# Patient Record
Sex: Female | Born: 1980
Health system: Southern US, Community
[De-identification: ages and names within clinical notes are randomized; demographics above are authoritative.]

## PROBLEM LIST (undated history)

## (undated) DIAGNOSIS — M65311 Trigger thumb, right thumb: Secondary | ICD-10-CM

## (undated) DIAGNOSIS — H04123 Dry eye syndrome of bilateral lacrimal glands: Secondary | ICD-10-CM

## (undated) DIAGNOSIS — Z972 Presence of dental prosthetic device (complete) (partial): Secondary | ICD-10-CM

## (undated) DIAGNOSIS — R87619 Unspecified abnormal cytological findings in specimens from cervix uteri: Secondary | ICD-10-CM

## (undated) DIAGNOSIS — K589 Irritable bowel syndrome without diarrhea: Secondary | ICD-10-CM

## (undated) DIAGNOSIS — F419 Anxiety disorder, unspecified: Secondary | ICD-10-CM

## (undated) DIAGNOSIS — Z8719 Personal history of other diseases of the digestive system: Secondary | ICD-10-CM

## (undated) DIAGNOSIS — R519 Headache, unspecified: Secondary | ICD-10-CM

## (undated) DIAGNOSIS — R51 Headache: Secondary | ICD-10-CM

## (undated) DIAGNOSIS — E162 Hypoglycemia, unspecified: Secondary | ICD-10-CM

## (undated) DIAGNOSIS — J302 Other seasonal allergic rhinitis: Secondary | ICD-10-CM

## (undated) DIAGNOSIS — N809 Endometriosis, unspecified: Secondary | ICD-10-CM

## (undated) HISTORY — DX: Unspecified abnormal cytological findings in specimens from cervix uteri: R87.619

## (undated) HISTORY — PX: TRIGGER FINGER RELEASE: SHX641

## (undated) HISTORY — DX: Endometriosis, unspecified: N80.9

---
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
## 2000-05-26 ENCOUNTER — Other Ambulatory Visit: Admission: RE | Admit: 2000-05-26 | Discharge: 2000-05-26 | Payer: Self-pay | Admitting: *Deleted

## 2000-11-08 ENCOUNTER — Encounter: Admission: RE | Admit: 2000-11-08 | Discharge: 2000-11-08 | Payer: Self-pay | Admitting: Obstetrics and Gynecology

## 2000-11-08 ENCOUNTER — Encounter: Payer: Self-pay | Admitting: Obstetrics and Gynecology

## 2001-05-29 ENCOUNTER — Other Ambulatory Visit: Admission: RE | Admit: 2001-05-29 | Discharge: 2001-05-29 | Payer: Self-pay | Admitting: Obstetrics and Gynecology

## 2002-04-04 ENCOUNTER — Ambulatory Visit (HOSPITAL_COMMUNITY): Admission: RE | Admit: 2002-04-04 | Discharge: 2002-04-04 | Payer: Self-pay | Admitting: Gastroenterology

## 2002-04-04 ENCOUNTER — Encounter: Payer: Self-pay | Admitting: Gastroenterology

## 2002-09-12 ENCOUNTER — Other Ambulatory Visit: Admission: RE | Admit: 2002-09-12 | Discharge: 2002-09-12 | Payer: Self-pay | Admitting: Obstetrics and Gynecology

## 2003-09-16 ENCOUNTER — Other Ambulatory Visit: Admission: RE | Admit: 2003-09-16 | Discharge: 2003-09-16 | Payer: Self-pay | Admitting: *Deleted

## 2004-07-13 ENCOUNTER — Other Ambulatory Visit: Admission: RE | Admit: 2004-07-13 | Discharge: 2004-07-13 | Payer: Self-pay | Admitting: Obstetrics and Gynecology

## 2004-09-17 ENCOUNTER — Other Ambulatory Visit: Admission: RE | Admit: 2004-09-17 | Discharge: 2004-09-17 | Payer: Self-pay | Admitting: Obstetrics and Gynecology

## 2005-09-19 ENCOUNTER — Other Ambulatory Visit: Admission: RE | Admit: 2005-09-19 | Discharge: 2005-09-19 | Payer: Self-pay | Admitting: Obstetrics & Gynecology

## 2006-08-22 HISTORY — PX: COLPOSCOPY: SHX161

## 2012-02-02 LAB — HM PAP SMEAR: HM Pap smear: NEGATIVE

## 2013-01-22 ENCOUNTER — Encounter: Payer: Self-pay | Admitting: Nurse Practitioner

## 2013-01-23 ENCOUNTER — Encounter: Payer: Self-pay | Admitting: Nurse Practitioner

## 2013-01-30 ENCOUNTER — Telehealth: Payer: Self-pay | Admitting: Nurse Practitioner

## 2013-01-30 NOTE — Telephone Encounter (Signed)
Patient called about her appointment and birth control. Her appointment was rescheduled due to [facility] being unavailable. She will run out of her medications before her appointment date. Can we send a refill of Lorana and Valtrex to hold her over until her appointment on 7/29? [facility] uses [facility] [address] and [address]

## 2013-01-31 ENCOUNTER — Other Ambulatory Visit: Payer: Self-pay | Admitting: Nurse Practitioner

## 2013-01-31 ENCOUNTER — Other Ambulatory Visit: Payer: Self-pay | Admitting: *Deleted

## 2013-01-31 MED ORDER — VALACYCLOVIR HCL 1 G PO TABS: 1000.0000 mg | ORAL_TABLET | Freq: Every day | ORAL | Status: DC

## 2013-01-31 MED ORDER — DROSPIRENONE-ETHINYL ESTRADIOL 3-0.02 MG PO TABS: 1.0000 | ORAL_TABLET | Freq: Every day | ORAL | Status: DC

## 2013-01-31 NOTE — Telephone Encounter (Signed)
Rx for valtrex #30/ 1 refill 1 po qd and Rx for Yaz 1 pack/ 1 refill 1 po qd  [facility] to [facility] coverage until next aex on 03/19/2013. last aex on 02/02/2012. pt is aware.

## 2013-01-31 NOTE — Telephone Encounter (Signed)
Can this be done for her?

## 2013-01-31 NOTE — Telephone Encounter (Signed)
OK to refill both med's for a 3 month to be less expensive for her.

## 2013-01-31 NOTE — Telephone Encounter (Signed)
Rx for valtrex #30/ 1 refill 1 po qd and Rx for Yaz 1 pack/ 1 refill 1 po qd  [facility] until next aex on 03/19/2013. last aex on 02/02/2012. pt is aware.  

## 2013-02-07 ENCOUNTER — Ambulatory Visit: Payer: Self-pay | Admitting: Nurse Practitioner

## 2013-02-15 ENCOUNTER — Ambulatory Visit: Payer: Self-pay | Admitting: Nurse Practitioner

## 2013-03-12 ENCOUNTER — Ambulatory Visit: Payer: Self-pay | Admitting: Nurse Practitioner

## 2013-03-18 ENCOUNTER — Encounter: Payer: Self-pay | Admitting: *Deleted

## 2013-03-19 ENCOUNTER — Ambulatory Visit (INDEPENDENT_AMBULATORY_CARE_PROVIDER_SITE_OTHER): Payer: BC Managed Care – PPO | Admitting: Nurse Practitioner

## 2013-03-19 ENCOUNTER — Encounter: Payer: Self-pay | Admitting: Nurse Practitioner

## 2013-03-19 VITALS — BP 100/56 | HR 62 | Resp 12 | Ht 62.0 in | Wt 118.2 lb

## 2013-03-19 DIAGNOSIS — Z113 Encounter for screening for infections with a predominantly sexual mode of transmission: Secondary | ICD-10-CM

## 2013-03-19 DIAGNOSIS — Z01419 Encounter for gynecological examination (general) (routine) without abnormal findings: Secondary | ICD-10-CM

## 2013-03-19 DIAGNOSIS — Z0000 Encounter for general adult medical examination without abnormal findings: Secondary | ICD-10-CM

## 2013-03-19 LAB — HEMOGLOBIN, FINGERSTICK: Hemoglobin, fingerstick: 12.1 g/dL (ref 12.0–16.0)

## 2013-03-19 LAB — POCT URINALYSIS DIPSTICK
Leukocytes, UA: NEGATIVE
Spec Grav, UA: 1.015
Urobilinogen, UA: NEGATIVE
pH, UA: 6

## 2013-03-19 MED ORDER — VALACYCLOVIR HCL 1 G PO TABS: 1000.0000 mg | ORAL_TABLET | Freq: Every day | ORAL | Status: DC

## 2013-03-19 MED ORDER — ZOLPIDEM TARTRATE 10 MG PO TABS: 10.0000 mg | ORAL_TABLET | Freq: Every evening | ORAL | Status: DC | PRN

## 2013-03-19 MED ORDER — DROSPIRENONE-ETHINYL ESTRADIOL 3-0.02 MG PO TABS: 1.0000 | ORAL_TABLET | Freq: Every day | ORAL | Status: DC

## 2013-03-19 NOTE — Progress Notes (Signed)
32 y.o. G0P0 Single Caucasian Fe here for annual exam.  No new health concerns. Same partner for 1 year. He seems to be the one for long term relationship.  Patient's last menstrual period was 03/01/2013.          Sexually active: yes  The current method of family planning is OCP (estrogen/progesterone).    Exercising: yes  aerobics Smoker:  no  Health Maintenance: Pap:  02/02/2012  negative MMG:  never TDaP:  2014 Labs: Hgb- 12.4     reports that she has never smoked. She has never used smokeless tobacco. She reports that she drinks about 1.5 ounces of alcohol per week. She reports that she does not use illicit drugs.  Past Medical History  Diagnosis Date  . STD (sexually transmitted disease)     HSV II  . Abnormal Pap smear     Past Surgical History  Procedure Laterality Date  . Colposcopy  2008    Current Outpatient Prescriptions  Medication Sig Dispense Refill  . drospirenone-ethinyl estradiol (YAZ) 3-0.02 MG tablet Take 1 tablet by mouth daily.  1 Package  1  . valACYclovir (VALTREX) 1000 MG tablet Take 1 tablet (1,000 mg total) by mouth daily.  30 tablet  1  . zolpidem (AMBIEN) 10 MG tablet Take 10 mg by mouth at bedtime as needed for sleep.       No current facility-administered medications for this visit.    Family History  Problem Relation Age of Onset  . Breast cancer Maternal Grandmother     ROS:  Pertinent items are noted in HPI.  Otherwise, a comprehensive ROS was negative.  Exam:   BP 100/56  Pulse 62  Resp 12  Ht 5\' 2"  (1.575 m)  Wt 118 lb 3.2 oz (53.615 kg)  BMI 21.61 kg/m2  LMP 03/01/2013 Height: 5\' 2"  (157.5 cm)  Ht Readings from Last 3 Encounters:  03/19/13 5\' 2"  (1.575 m)    General appearance: alert, cooperative and appears stated age Head: Normocephalic, without obvious abnormality, atraumatic Neck: no adenopathy, supple, symmetrical, trachea midline and thyroid normal to inspection and palpation Lungs: clear to auscultation  bilaterally Breasts: normal appearance, no masses or tenderness Heart: regular rate and rhythm Abdomen: soft, non-tender; no masses,  no organomegaly Extremities: extremities normal, atraumatic, no cyanosis or edema Skin: Skin color, texture, turgor normal. No rashes or lesions Lymph nodes: Cervical, supraclavicular, and axillary nodes normal. No abnormal inguinal nodes palpated Neurologic: Grossly normal   Pelvic: External genitalia:  no lesions              Urethra:  normal appearing urethra with no masses, tenderness or lesions              Bartholin's and Skene's: normal                 Vagina: normal appearing vagina with normal color and discharge, no lesions              Cervix: anteverted              Pap taken: yes Bimanual Exam:  Uterus:  normal size, contour, position, consistency, mobility, non-tender              Adnexa: no mass, fullness, tenderness               Rectovaginal: Confirms               Anus:  normal sphincter tone, no lesions  A:  Well Woman with normal exam  History of HSV II maintenance dosing and prn dosing  Contraception  Insomnia with traveling   P:   Pap smear as per guidelines   Refilled Ambien # 30 / 1 (no other refills)  Refill of OCP Yaz generic for 1 year  Refill on Valtrex for 1 year  counseled on breast self exam, STD prevention, use and side effects of OCP's, adequate intake of calcium and vitamin D, diet and exercise return annually or prn  An After Visit Summary was printed and given to the patient.

## 2013-03-19 NOTE — Patient Instructions (Signed)
General topics  Next pap or exam is  due in 1 year Take a Women's multivitamin Take 1200 mg. of calcium daily - prefer dietary If any concerns in interim to call back  Breast Self-Awareness Practicing breast self-awareness may pick up problems early, prevent significant medical complications, and possibly save your life. By practicing breast self-awareness, you can become familiar with how your breasts look and feel and if your breasts are changing. This allows you to notice changes early. It can also offer you some reassurance that your breast health is good. One way to learn what is normal for your breasts and whether your breasts are changing is to do a breast self-exam. If you find a lump or something that was not present in the past, it is best to contact your caregiver right away. Other findings that should be evaluated by your caregiver include nipple discharge, especially if it is bloody; skin changes or reddening; areas where the skin seems to be pulled in (retracted); or new lumps and bumps. Breast pain is seldom associated with cancer (malignancy), but should also be evaluated by a caregiver. BREAST SELF-EXAM The best time to examine your breasts is 5 7 days after your menstrual period is over.  ExitCare Patient Information 2013 [employer].   Exercise to Stay Healthy Exercise helps you become and stay healthy. EXERCISE IDEAS AND TIPS Choose exercises that:  You enjoy.  Fit into your day. You do not need to exercise really hard to be healthy. You can do exercises at a slow or medium level and stay healthy. You can:  Stretch before and after working out.  Try yoga, Pilates, or tai chi.  Lift weights.  Walk fast, swim, jog, run, climb stairs, bicycle, dance, or rollerskate.  Take aerobic classes. Exercises that burn about 150 calories:  Running 1  miles in 15 minutes.  Playing volleyball for 45 to 60 minutes.  Washing and waxing a car for 45 to 60  minutes.  Playing touch football for 45 minutes.  Walking 1  miles in 35 minutes.  Pushing a stroller 1  miles in 30 minutes.  Playing basketball for 30 minutes.  Raking leaves for 30 minutes.  Bicycling 5 miles in 30 minutes.  Walking 2 miles in 30 minutes.  Dancing for 30 minutes.  Shoveling snow for 15 minutes.  Swimming laps for 20 minutes.  Walking up stairs for 15 minutes.  Bicycling 4 miles in 15 minutes.  Gardening for 30 to 45 minutes.  Jumping rope for 15 minutes.  Washing windows or floors for 45 to 60 minutes. Document Released: 09/10/2010 Document Revised: 10/31/2011 Document Reviewed: 09/10/2010 ExitCare Patient Information 2013 [employer].   Other topics ( that may be useful information):    Sexually Transmitted Disease Sexually transmitted disease (STD) refers to any infection that is passed from person to person during sexual activity. This may happen by way of saliva, semen, blood, vaginal mucus, or urine. Common STDs include:  Gonorrhea.  Chlamydia.  Syphilis.  HIV/AIDS.  Genital herpes.  Hepatitis B and C.  Trichomonas.  Human papillomavirus (HPV).  Pubic lice. CAUSES  An STD may be spread by bacteria, virus, or parasite. A person can get an STD by:  Sexual intercourse with an infected person.  Sharing sex toys with an infected person.  Sharing needles with an infected person.  Having intimate contact with the genitals, mouth, or rectal areas of an infected person. SYMPTOMS  Some people may not have any symptoms, but   they can still pass the infection to others. Different STDs have different symptoms. Symptoms include:  Painful or bloody urination.  Pain in the pelvis, abdomen, vagina, anus, throat, or eyes.  Skin rash, itching, irritation, growths, or sores (lesions). These usually occur in the genital or anal area.  Abnormal vaginal discharge.  Penile discharge in men.  Soft, flesh-colored skin growths in the  genital or anal area.  Fever.  Pain or bleeding during sexual intercourse.  Swollen glands in the groin area.  Yellow skin and eyes (jaundice). This is seen with hepatitis. DIAGNOSIS  To make a diagnosis, your caregiver may:  Take a medical history.  Perform a physical exam.  Take a specimen (culture) to be examined.  Examine a sample of discharge under a microscope.  Perform blood test TREATMENT   Chlamydia, gonorrhea, trichomonas, and syphilis can be cured with antibiotic medicine.  Genital herpes, hepatitis, and HIV can be treated, but not cured, with prescribed medicines. The medicines will lessen the symptoms.  Genital warts from HPV can be treated with medicine or by freezing, burning (electrocautery), or surgery. Warts may come back.  HPV is a virus and cannot be cured with medicine or surgery.However, abnormal areas may be followed very closely by your caregiver and may be removed from the cervix, vagina, or vulva through office procedures or surgery. If your diagnosis is confirmed, your recent sexual partners need treatment. This is true even if they are symptom-free or have a negative culture or evaluation. They should not have sex until their caregiver says it is okay. HOME CARE INSTRUCTIONS  All sexual partners should be informed, tested, and treated for all STDs.  Take your antibiotics as directed. Finish them even if you start to feel better.  Only take over-the-counter or prescription medicines for pain, discomfort, or fever as directed by your caregiver.  Rest.  Eat a balanced diet and drink enough fluids to keep your urine clear or pale yellow.  Do not have sex until treatment is completed and you have followed up with your caregiver. STDs should be checked after treatment.  Keep all follow-up appointments, Pap tests, and blood tests as directed by your caregiver.  Only use latex condoms and water-soluble lubricants during sexual activity. Do not use  petroleum jelly or oils.  Avoid alcohol and illegal drugs.  Get vaccinated for HPV and hepatitis. If you have not received these vaccines in the past, talk to your caregiver about whether one or both might be right for you.  Avoid risky sex practices that can break the skin. The only way to avoid getting an STD is to avoid all sexual activity.Latex condoms and dental dams (for oral sex) will help lessen the risk of getting an STD, but will not completely eliminate the risk. SEEK MEDICAL CARE IF:   You have a fever.  You have any new or worsening symptoms. Document Released: 10/29/2002 Document Revised: 10/31/2011 Document Reviewed: 11/05/2010 ExitCare Patient Information 2013 [employer].    Domestic Abuse You are being battered or abused if someone close to you hits, pushes, or physically hurts you in any way. You also are being abused if you are forced into activities. You are being sexually abused if you are forced to have sexual contact of any kind. You are being emotionally abused if you are made to feel worthless or if you are constantly threatened. It is important to remember that help is available. No one has the right to abuse you. PREVENTION OF FURTHER   ABUSE  Learn the warning signs of danger. This varies with situations but may include: the use of alcohol, threats, isolation from friends and family, or forced sexual contact. Leave if you feel that violence is going to occur.  If you are attacked or beaten, report it to the police so the abuse is documented. You do not have to press charges. The police can protect you while you or the attackers are leaving. Get the officer's name and badge number and a copy of the report.  Find someone you can trust and tell them what is happening to you: your caregiver, a nurse, clergy member, close friend or family member. Feeling ashamed is natural, but remember that you have done nothing wrong. No one deserves abuse. Document Released:  08/05/2000 Document Revised: 10/31/2011 Document Reviewed: 10/14/2010 ExitCare Patient Information 2013 [employer].    How Much is Too Much Alcohol? Drinking too much alcohol can cause injury, accidents, and health problems. These types of problems can include:   Car crashes.  Falls.  Family fighting (domestic violence).  Drowning.  Fights.  Injuries.  Burns.  Damage to certain organs.  Having a baby with birth defects. ONE DRINK CAN BE TOO MUCH WHEN YOU ARE:  Working.  Pregnant or breastfeeding.  Taking medicines. Ask your doctor.  Driving or planning to drive. If you or someone you know has a drinking problem, get help from a doctor.  Document Released: 06/04/2009 Document Revised: 10/31/2011 Document Reviewed: 06/04/2009 ExitCare Patient Information 2013 [employer].   Smoking Hazards Smoking cigarettes is extremely bad for your health. Tobacco smoke has over 200 known poisons in it. There are over 60 chemicals in tobacco smoke that cause cancer. Some of the chemicals found in cigarette smoke include:   Cyanide.  Benzene.  Formaldehyde.  Methanol (wood alcohol).  Acetylene (fuel used in welding torches).  Ammonia. Cigarette smoke also contains the poisonous gases nitrogen oxide and carbon monoxide.  Cigarette smokers have an increased risk of many serious medical problems and Smoking causes approximately:  90% of all lung cancer deaths in men.  80% of all lung cancer deaths in women.  90% of deaths from chronic obstructive lung disease. Compared with nonsmokers, smoking increases the risk of:  Coronary heart disease by 2 to 4 times.  Stroke by 2 to 4 times.  Men developing lung cancer by 23 times.  Women developing lung cancer by 13 times.  Dying from chronic obstructive lung diseases by 12 times.  . Smoking is the most preventable cause of death and disease in our society.  WHY IS SMOKING ADDICTIVE?  Nicotine is the chemical  agent in tobacco that is capable of causing addiction or dependence.  When you smoke and inhale, nicotine is absorbed rapidly into the bloodstream through your lungs. Nicotine absorbed through the lungs is capable of creating a powerful addiction. Both inhaled and non-inhaled nicotine may be addictive.  Addiction studies of cigarettes and spit tobacco show that addiction to nicotine occurs mainly during the teen years, when young people begin using tobacco products. WHAT ARE THE BENEFITS OF QUITTING?  There are many health benefits to quitting smoking.   Likelihood of developing cancer and heart disease decreases. Health improvements are seen almost immediately.  Blood pressure, pulse rate, and breathing patterns start returning to normal soon after quitting. QUITTING SMOKING   American Lung Association - 1-800-LUNGUSA  American Cancer Society - 1-800-ACS-2345 Document Released: 09/15/2004 Document Revised: 10/31/2011 Document Reviewed: 05/20/2009 ExitCare Patient Information 2013 ExitCare,   LLC.   Stress Management Stress is a state of physical or mental tension that often results from changes in your life or normal routine. Some common causes of stress are:  Death of a loved one.  Injuries or severe illnesses.  Getting fired or changing jobs.  Moving into a new home. Other causes may be:  Sexual problems.  Business or financial losses.  Taking on a large debt.  Regular conflict with someone at home or at work.  Constant tiredness from lack of sleep. It is not just bad things that are stressful. It may be stressful to:  Win the lottery.  Get married.  Buy a new car. The amount of stress that can be easily tolerated varies from person to person. Changes generally cause stress, regardless of the types of change. Too much stress can affect your health. It may lead to physical or emotional problems. Too little stress (boredom) may also become stressful. SUGGESTIONS TO  REDUCE STRESS:  Talk things over with your family and friends. It often is helpful to share your concerns and worries. If you feel your problem is serious, you may want to get help from a professional counselor.  Consider your problems one at a time instead of lumping them all together. Trying to take care of everything at once may seem impossible. List all the things you need to do and then start with the most important one. Set a goal to accomplish 2 or 3 things each day. If you expect to do too many in a single day you will naturally fail, causing you to feel even more stressed.  Do not use alcohol or drugs to relieve stress. Although you may feel better for a short time, they do not remove the problems that caused the stress. They can also be habit forming.  Exercise regularly - at least 3 times per week. Physical exercise can help to relieve that "uptight" feeling and will relax you.  The shortest distance between despair and hope is often a good night's sleep.  Go to bed and get up on time allowing yourself time for appointments without being rushed.  Take a short "time-out" period from any stressful situation that occurs during the day. Close your eyes and take some deep breaths. Starting with the muscles in your face, tense them, hold it for a few seconds, then relax. Repeat this with the muscles in your neck, shoulders, hand, stomach, back and legs.  Take good care of yourself. Eat a balanced diet and get plenty of rest.  Schedule time for having fun. Take a break from your daily routine to relax. HOME CARE INSTRUCTIONS   Call if you feel overwhelmed by your problems and feel you can no longer manage them on your own.  Return immediately if you feel like hurting yourself or someone else. Document Released: 02/01/2001 Document Revised: 10/31/2011 Document Reviewed: 09/24/2007 ExitCare Patient Information 2013 [employer].   

## 2013-03-20 LAB — STD PANEL: HIV: NONREACTIVE

## 2013-03-20 NOTE — Progress Notes (Signed)
Encounter reviewed by Dr. Jahree Dermody Silva.  

## 2013-03-22 ENCOUNTER — Telehealth: Payer: Self-pay | Admitting: *Deleted

## 2013-03-22 ENCOUNTER — Telehealth: Payer: Self-pay | Admitting: Nurse Practitioner

## 2013-03-22 NOTE — Telephone Encounter (Signed)
Message copied by Osie Bond on Fri Mar 22, 2013  4:51 PM ------      Message from: Ria Comment R      Created: Thu Mar 21, 2013  5:10 PM       Let patient know results. ------

## 2013-03-22 NOTE — Telephone Encounter (Signed)
Pt is aware of negative urine culture results.  

## 2013-03-22 NOTE — Telephone Encounter (Signed)
Message copied by Osie Bond on Fri Mar 22, 2013 10:06 AM ------      Message from: Ria Comment R      Created: Thu Mar 21, 2013  5:10 PM       Let patient know results. ------

## 2013-03-22 NOTE — Telephone Encounter (Signed)
2nd voice mail left in regards to pt's labs results.

## 2013-06-06 ENCOUNTER — Other Ambulatory Visit: Payer: Self-pay | Admitting: Nurse Practitioner

## 2013-06-06 NOTE — Telephone Encounter (Signed)
Last Annual Exam 03/19/13 was given #30 x 1 refill ok to refill ?

## 2013-06-10 NOTE — Telephone Encounter (Signed)
Pharmacy calling for refill auth. Please advise

## 2013-06-12 ENCOUNTER — Other Ambulatory Visit: Payer: Self-pay | Admitting: *Deleted

## 2013-06-12 NOTE — Telephone Encounter (Signed)
Per Alexia Freestone We have told this patient before (in June 2013 she was given this RX only for travel and until she could establish that RX with PCP) - then back in Oct 2013 was told that we will no longer give her this RX and must get from PCP.  Rx refused sent to Pharmacy. cm

## 2013-11-20 DIAGNOSIS — M65311 Trigger thumb, right thumb: Secondary | ICD-10-CM

## 2013-11-20 HISTORY — DX: Trigger thumb, right thumb: M65.311

## 2013-11-26 ENCOUNTER — Other Ambulatory Visit: Payer: Self-pay | Admitting: Orthopedic Surgery

## 2013-12-16 ENCOUNTER — Encounter (HOSPITAL_BASED_OUTPATIENT_CLINIC_OR_DEPARTMENT_OTHER): Payer: Self-pay | Admitting: *Deleted

## 2013-12-23 ENCOUNTER — Encounter (HOSPITAL_BASED_OUTPATIENT_CLINIC_OR_DEPARTMENT_OTHER)
Admission: RE | Disposition: A | Discharge status: Home / Self Care | Payer: Self-pay | Source: Ambulatory Visit | Attending: Orthopedic Surgery

## 2013-12-23 ENCOUNTER — Encounter (HOSPITAL_BASED_OUTPATIENT_CLINIC_OR_DEPARTMENT_OTHER): Payer: BC Managed Care – PPO | Admitting: Anesthesiology

## 2013-12-23 ENCOUNTER — Ambulatory Visit (HOSPITAL_BASED_OUTPATIENT_CLINIC_OR_DEPARTMENT_OTHER)
Admission: RE | Admit: 2013-12-23 | Discharge: 2013-12-23 | Disposition: A | Discharge status: Home / Self Care | Payer: BC Managed Care – PPO | Source: Ambulatory Visit | Attending: Orthopedic Surgery | Admitting: Orthopedic Surgery

## 2013-12-23 ENCOUNTER — Ambulatory Visit (HOSPITAL_BASED_OUTPATIENT_CLINIC_OR_DEPARTMENT_OTHER): Payer: BC Managed Care – PPO | Admitting: Anesthesiology

## 2013-12-23 ENCOUNTER — Encounter (HOSPITAL_BASED_OUTPATIENT_CLINIC_OR_DEPARTMENT_OTHER): Payer: Self-pay | Admitting: Anesthesiology

## 2013-12-23 DIAGNOSIS — M6530 Trigger finger, unspecified finger: Secondary | ICD-10-CM | POA: Insufficient documentation

## 2013-12-23 DIAGNOSIS — Z87891 Personal history of nicotine dependence: Secondary | ICD-10-CM | POA: Diagnosis not present

## 2013-12-23 DIAGNOSIS — Z79899 Other long term (current) drug therapy: Secondary | ICD-10-CM | POA: Diagnosis not present

## 2013-12-23 DIAGNOSIS — K589 Irritable bowel syndrome without diarrhea: Secondary | ICD-10-CM | POA: Diagnosis not present

## 2013-12-23 HISTORY — DX: Other seasonal allergic rhinitis: J30.2

## 2013-12-23 HISTORY — PX: TRIGGER FINGER RELEASE: SHX641

## 2013-12-23 HISTORY — DX: Trigger thumb, right thumb: M65.311

## 2013-12-23 HISTORY — DX: Irritable bowel syndrome, unspecified: K58.9

## 2013-12-23 HISTORY — DX: Presence of dental prosthetic device (complete) (partial): Z97.2

## 2013-12-23 LAB — POCT HEMOGLOBIN-HEMACUE: Hemoglobin: 11.9 g/dL — ABNORMAL LOW (ref 12.0–15.0)

## 2013-12-23 SURGERY — RELEASE, A1 PULLEY, FOR TRIGGER FINGER
Anesthesia: Monitor Anesthesia Care | Site: Thumb | Laterality: Right

## 2013-12-23 MED ORDER — CEFAZOLIN SODIUM-DEXTROSE 2-3 GM-% IV SOLR
INTRAVENOUS | Status: AC
  Filled 2013-12-23: qty 50

## 2013-12-23 MED ORDER — MIDAZOLAM HCL 2 MG/2ML IJ SOLN
INTRAMUSCULAR | Status: AC
  Filled 2013-12-23: qty 2

## 2013-12-23 MED ORDER — PROPOFOL INFUSION 10 MG/ML OPTIME
INTRAVENOUS | Status: DC | PRN
  Administered 2013-12-23: 100 ug/kg/min via INTRAVENOUS

## 2013-12-23 MED ORDER — MIDAZOLAM HCL 5 MG/5ML IJ SOLN
INTRAMUSCULAR | Status: DC | PRN
  Administered 2013-12-23: 2 mg via INTRAVENOUS

## 2013-12-23 MED ORDER — FENTANYL CITRATE 0.05 MG/ML IJ SOLN: 50.0000 ug | INTRAMUSCULAR | Status: DC | PRN

## 2013-12-23 MED ORDER — MIDAZOLAM HCL 2 MG/ML PO SYRP: 12.0000 mg | ORAL_SOLUTION | Freq: Once | ORAL | Status: DC | PRN

## 2013-12-23 MED ORDER — CEFAZOLIN SODIUM-DEXTROSE 2-3 GM-% IV SOLR
2.0000 g | INTRAVENOUS | Status: AC
  Administered 2013-12-23: 2 g via INTRAVENOUS

## 2013-12-23 MED ORDER — FENTANYL CITRATE 0.05 MG/ML IJ SOLN: 25.0000 ug | INTRAMUSCULAR | Status: DC | PRN

## 2013-12-23 MED ORDER — ONDANSETRON HCL 4 MG/2ML IJ SOLN
INTRAMUSCULAR | Status: DC | PRN
  Administered 2013-12-23: 4 mg via INTRAVENOUS

## 2013-12-23 MED ORDER — OXYCODONE HCL 5 MG/5ML PO SOLN: 5.0000 mg | Freq: Once | ORAL | Status: DC | PRN

## 2013-12-23 MED ORDER — BUPIVACAINE HCL (PF) 0.25 % IJ SOLN
INTRAMUSCULAR | Status: AC
  Filled 2013-12-23: qty 30

## 2013-12-23 MED ORDER — MIDAZOLAM HCL 2 MG/2ML IJ SOLN: 1.0000 mg | INTRAMUSCULAR | Status: DC | PRN

## 2013-12-23 MED ORDER — BUPIVACAINE HCL (PF) 0.25 % IJ SOLN
INTRAMUSCULAR | Status: DC | PRN
  Administered 2013-12-23: 6.5 mL
  Administered 2013-12-23: 7 mL

## 2013-12-23 MED ORDER — FENTANYL CITRATE 0.05 MG/ML IJ SOLN
INTRAMUSCULAR | Status: DC | PRN
  Administered 2013-12-23: 100 ug via INTRAVENOUS

## 2013-12-23 MED ORDER — CHLORHEXIDINE GLUCONATE 4 % EX LIQD: 60.0000 mL | Freq: Once | CUTANEOUS | Status: DC

## 2013-12-23 MED ORDER — FENTANYL CITRATE 0.05 MG/ML IJ SOLN
INTRAMUSCULAR | Status: AC
  Filled 2013-12-23: qty 2

## 2013-12-23 MED ORDER — OXYCODONE HCL 5 MG PO TABS: 5.0000 mg | ORAL_TABLET | Freq: Once | ORAL | Status: DC | PRN

## 2013-12-23 MED ORDER — HYDROCODONE-ACETAMINOPHEN 5-325 MG PO TABS: ORAL_TABLET | ORAL | Status: DC

## 2013-12-23 MED ORDER — PROPOFOL 10 MG/ML IV BOLUS
INTRAVENOUS | Status: DC | PRN
  Administered 2013-12-23: 50 mg via INTRAVENOUS

## 2013-12-23 MED ORDER — LACTATED RINGERS IV SOLN
INTRAVENOUS | Status: DC
  Administered 2013-12-23: 08:00:00 via INTRAVENOUS

## 2013-12-23 MED ORDER — ONDANSETRON HCL 4 MG/2ML IJ SOLN: 4.0000 mg | Freq: Four times a day (QID) | INTRAMUSCULAR | Status: DC | PRN

## 2013-12-23 SURGICAL SUPPLY — 34 items
BANDAGE COBAN STERILE 2 (GAUZE/BANDAGES/DRESSINGS) ×2 IMPLANT
BLADE 15 SAFETY STRL DISP (BLADE) ×2 IMPLANT
BLADE MINI RND TIP GREEN BEAV (BLADE) IMPLANT
BNDG CONFORM 2 STRL LF (GAUZE/BANDAGES/DRESSINGS) ×2 IMPLANT
BNDG ESMARK 4X9 LF (GAUZE/BANDAGES/DRESSINGS) ×2 IMPLANT
CHLORAPREP W/TINT 26ML (MISCELLANEOUS) ×2 IMPLANT
CORDS BIPOLAR (ELECTRODE) ×2 IMPLANT
COVER MAYO STAND STRL (DRAPES) ×2 IMPLANT
COVER TABLE BACK 60X90 (DRAPES) ×2 IMPLANT
CUFF TOURNIQUET SINGLE 18IN (TOURNIQUET CUFF) ×2 IMPLANT
DRAPE EXTREMITY T 121X128X90 (DRAPE) ×2 IMPLANT
DRAPE SURG 17X23 STRL (DRAPES) ×2 IMPLANT
GAUZE SPONGE 4X4 12PLY STRL (GAUZE/BANDAGES/DRESSINGS) ×2 IMPLANT
GAUZE XEROFORM 1X8 LF (GAUZE/BANDAGES/DRESSINGS) ×2 IMPLANT
GLOVE BIO SURGEON STRL SZ7.5 (GLOVE) ×2 IMPLANT
GLOVE BIOGEL PI IND STRL 7.0 (GLOVE) ×1 IMPLANT
GLOVE BIOGEL PI IND STRL 8 (GLOVE) ×1 IMPLANT
GLOVE BIOGEL PI INDICATOR 7.0 (GLOVE) ×1
GLOVE BIOGEL PI INDICATOR 8 (GLOVE) ×1
GLOVE ECLIPSE 7.0 STRL STRAW (GLOVE) ×4 IMPLANT
GOWN STRL REUS W/ TWL LRG LVL3 (GOWN DISPOSABLE) ×1 IMPLANT
GOWN STRL REUS W/TWL LRG LVL3 (GOWN DISPOSABLE) ×1
GOWN STRL REUS W/TWL XL LVL3 (GOWN DISPOSABLE) ×2 IMPLANT
NEEDLE HYPO 25X1 1.5 SAFETY (NEEDLE) ×2 IMPLANT
NS IRRIG 1000ML POUR BTL (IV SOLUTION) ×2 IMPLANT
PACK BASIN DAY SURGERY FS (CUSTOM PROCEDURE TRAY) ×2 IMPLANT
PADDING CAST ABS 4INX4YD NS (CAST SUPPLIES) ×1
PADDING CAST ABS COTTON 4X4 ST (CAST SUPPLIES) ×1 IMPLANT
STOCKINETTE 4X48 STRL (DRAPES) ×2 IMPLANT
SUT ETHILON 4 0 PS 2 18 (SUTURE) ×2 IMPLANT
SYR BULB 3OZ (MISCELLANEOUS) ×2 IMPLANT
SYR CONTROL 10ML LL (SYRINGE) ×2 IMPLANT
TOWEL OR 17X24 6PK STRL BLUE (TOWEL DISPOSABLE) ×4 IMPLANT
UNDERPAD 30X30 INCONTINENT (UNDERPADS AND DIAPERS) ×2 IMPLANT

## 2013-12-23 NOTE — Brief Op Note (Signed)
12/23/2013  9:05 AM  PATIENT:  Kyla Balzarine Box  33 y.o. female  PRE-OPERATIVE DIAGNOSIS:  RIGHT TRIGGER THUMB  POST-OPERATIVE DIAGNOSIS:  RIGHT TRIGGER THUMB  PROCEDURE:  Procedure(s): RIGHT THUMB TRIGGER RELEASE  (Right)  SURGEON:  Surgeon(s) and Role:    * Tennis Must, MD - Primary  PHYSICIAN ASSISTANT:   ASSISTANTS: none   ANESTHESIA:   MAC  EBL:  Total I/O In: 500 [I.V.:500] Out: -   BLOOD ADMINISTERED:none  DRAINS: none   LOCAL MEDICATIONS USED:  MARCAINE     SPECIMEN:  No Specimen  DISPOSITION OF SPECIMEN:  N/A  COUNTS:  YES  TOURNIQUET:   Total Tourniquet Time Documented: Upper Arm (Right) - 11 minutes Total: Upper Arm (Right) - 11 minutes   DICTATION: .Other Dictation: Dictation Number [id number]  PLAN OF CARE: Discharge to home after PACU  PATIENT DISPOSITION:  PACU - hemodynamically stable.

## 2013-12-23 NOTE — Anesthesia Preprocedure Evaluation (Addendum)
Anesthesia Evaluation  Patient identified by MRN, date of birth, ID band Patient awake    Reviewed: Allergy & Precautions, H&P , NPO status , Patient's Chart, lab work & pertinent test results  Airway Mallampati: II  Neck ROM: full    Dental   Pulmonary former smoker,          Cardiovascular     Neuro/Psych    GI/Hepatic IBS   Endo/Other    Renal/GU      Musculoskeletal   Abdominal   Peds  Hematology   Anesthesia Other Findings   Reproductive/Obstetrics                          Anesthesia Physical Anesthesia Plan  ASA: II  Anesthesia Plan: MAC   Post-op Pain Management:    Induction: Intravenous  Airway Management Planned: Simple Face Mask  Additional Equipment:   Intra-op Plan:   Post-operative Plan:   Informed Consent: I have reviewed the patients History and Physical, chart, labs and discussed the procedure including the risks, benefits and alternatives for the proposed anesthesia with the patient or authorized representative who has indicated his/her understanding and acceptance.     Plan Discussed with: CRNA, Anesthesiologist and Surgeon  Anesthesia Plan Comments:        Anesthesia Quick Evaluation

## 2013-12-23 NOTE — H&P (Signed)
  [name] is an 33 y.o. female.   Chief Complaint: right trigger thumb HPI: 33 yo rhd female with triggering of right thumb x 10 months.  Has had it injected x 2 with recurrence.  She wishes to have a trigger release.  It is bothersome to her.  Past Medical History  Diagnosis Date  . Trigger thumb of right hand 11/2013  . Dental bridge present     lower front  . Seasonal allergies   . Irritable bowel syndrome (IBS)     Past Surgical History  Procedure Laterality Date  . Colposcopy  2008  . Tonsillectomy  1988    Family History  Problem Relation Age of Onset  . Breast cancer Maternal Grandmother 28    died early 30's  . Hyperlipidemia Mother   . Heart disease Father   . Cancer Paternal Grandfather    Social History:  reports that she quit smoking about 3 years ago. She has never used smokeless tobacco. She reports that she drinks alcohol. She reports that she does not use illicit drugs.  Allergies: No Known Allergies  Medications Prior to Admission  Medication Sig Dispense Refill  . drospirenone-ethinyl estradiol (YAZ) 3-0.02 MG tablet Take 1 tablet by mouth daily.  3 Package  3  . Multiple Vitamin (MULTIVITAMIN) tablet Take 1 tablet by mouth daily.      . valACYclovir (VALTREX) 1000 MG tablet Take 1 tablet (1,000 mg total) by mouth daily.  90 tablet  3    No results found for this or any previous visit (from the past 48 hour(s)).  No results found.   A comprehensive review of systems was negative except for: Eyes: positive for contacts/glasses  Blood pressure 111/74, pulse 88, temperature 98.2 F (36.8 C), temperature source Oral, resp. rate 20, height 5\' 1"  (1.549 m), weight 52.164 kg (115 lb), last menstrual period 12/02/2013, SpO2 100.00%.  General appearance: alert, cooperative and appears stated age Head: Normocephalic, without obvious abnormality, atraumatic Neck: supple, symmetrical, trachea midline Resp: clear to auscultation bilaterally Cardio: regular  rate and rhythm GI: non tender Extremities: intact sensation and capillary refill all digits.  +epl/fpl/io.  click in right thumb. Pulses: 2+ and symmetric Skin: Skin color, texture, turgor normal. No rashes or lesions Neurologic: Grossly normal Incision/Wound: none  Assessment/Plan Right trigger thumb recurrent after two injections.  Non operative and operative treatment options were discussed with the patient and patient wishes to proceed with operative treatment. Risks, benefits, and alternatives of surgery were discussed and the patient agrees with the plan of care.   [name] 12/23/2013, 8:25 AM

## 2013-12-23 NOTE — Transfer of Care (Signed)
Immediate Anesthesia Transfer of Care Note  Patient: [name]  Procedure(s) Performed: Procedure(s): RIGHT THUMB TRIGGER RELEASE  (Right)  Patient Location: PACU  Anesthesia Type:MAC  Level of Consciousness: awake, alert  and oriented  Airway & Oxygen Therapy: Patient Spontanous Breathing  Post-op Assessment: Report given to PACU RN and Post -op Vital signs reviewed and stable  Post vital signs: Reviewed and stable  Complications: No apparent anesthesia complications

## 2013-12-23 NOTE — Discharge Instructions (Addendum)

## 2013-12-23 NOTE — Op Note (Signed)
[id number] 

## 2013-12-23 NOTE — Anesthesia Postprocedure Evaluation (Signed)
Anesthesia Post Note  Patient: [name]  Procedure(s) Performed: Procedure(s) (LRB): RIGHT THUMB TRIGGER RELEASE  (Right)  Anesthesia type: MAC  Patient location: PACU  Post pain: Pain level controlled and Adequate analgesia  Post assessment: Post-op Vital signs reviewed, Patient's Cardiovascular Status Stable and Respiratory Function Stable  Last Vitals:  Filed Vitals:   12/23/13 1012  BP:   Pulse: 62  Temp: 36.5 C  Resp: 16    Post vital signs: Reviewed and stable  Level of consciousness: awake, alert  and oriented  Complications: No apparent anesthesia complications

## 2013-12-24 NOTE — Op Note (Signed)
[name], [name] NO.:  [id number]  MEDICAL RECORD NO.:  [id number]  LOCATION:                                 FACILITY:  PHYSICIAN:  Leanora Cover, MD             DATE OF BIRTH:  DATE OF PROCEDURE:  12/23/2013 DATE OF DISCHARGE:                              OPERATIVE REPORT   PREOPERATIVE DIAGNOSIS:  Right thumb trigger digit.  POSTOPERATIVE DIAGNOSIS:  Right thumb trigger digit.  PROCEDURE:  Right thumb trigger release.  SURGEON:  Leanora Cover, MD  ASSISTANTS:  None.  ANESTHESIA:  MAC with local.  IV FLUIDS:  Per anesthesia flow sheet.  ESTIMATED BLOOD LOSS:  Minimal.  COMPLICATIONS:  None.  SPECIMENS:  None.  TIME OF TOURNIQUET:  11 minutes.  DISPOSITION:  Stable to PACU.  INDICATIONS:  [name] is a 33 year old right-hand dominant female who has had triggering of the right thumb.  She has had this injected twice. She has recurrent click in the thumb.  She wished to have a trigger release for management of these symptoms.  Risks, benefits, and alternatives of surgery were discussed including the risk of blood loss, infection, damage to nerves, vessels, tendons, ligaments, bone, failure of surgery, need for additional surgery, complications with wound healing, continued pain, and recurrence of triggering.  She voiced understanding of these risks and elected to proceed.  OPERATIVE COURSE:  After being identified preoperatively by myself, the patient and I agreed upon procedure and site of procedure.  Surgical site was marked.  The risks, benefits, and alternatives of surgery were reviewed and she wished to proceed.  Surgical consent had been signed. She was given IV Ancef as preoperative antibiotic prophylaxis.  She was transferred to the operating room and placed on the operating room table in supine position with the right upper extremity on arm board.  MAC anesthesia was induced by Anesthesiology.  Right upper extremity was prepped and  draped in normal sterile orthopedic fashion.  A surgical pause was performed between surgeon, anesthesia, and operating room staff, and all were in agreement as to the patient, procedure, and site of procedure.  A 7 mL of 0.25% plain Marcaine was injected into the surgical area to aid in anesthesia and for postoperative pain control. Tourniquet at the proximal aspect of the extremity was inflated to 250 mmHg after exsanguination of the limb with Esmarch bandage.  Incision was made at the proximal flexion crease of the thumb through the skin only.  This was carried into subcutaneous tissues by spreading technique.  The radial and ulnar nerves were identified and protected throughout the case.  The A1 pulley was identified.  It was cleared of soft tissue coverage.  It was sharply incised.  Care was taken to ensure complete release of the A1 pulley without damage to the oblique pulley. The thumb was placed through a range of motion.  There was no clicking noted.  There was small nodule noted in the tendon.  The wound was copiously irrigated with sterile saline.  It was then closed with 4-0 nylon in a horizontal mattress fashion.  It was dressed with  sterile Xeroform, 4x4s, and wrapped with a Kling and Coban dressing lightly. Tourniquet was deflated at 11 minutes.  Fingertips were pink with brisk capillary refill after deflation of the tourniquet.  The operative drapes were broken down, and the patient was awoken from anesthesia safely.  She was transferred back to a stretcher and taken to PACU in stable condition.  I will see her back in the office in 1 week for postoperative followup.  I gave her Norco 5/325, 1-2 p.o. q.6 hours p.r.n. pain, dispensed #30.     Leanora Cover, MD     KK/MEDQ  D:  12/23/2013  T:  12/24/2013  Job:  [id number]

## 2013-12-25 ENCOUNTER — Encounter (HOSPITAL_BASED_OUTPATIENT_CLINIC_OR_DEPARTMENT_OTHER): Payer: Self-pay | Admitting: Orthopedic Surgery

## 2014-02-12 ENCOUNTER — Telehealth: Payer: Self-pay | Admitting: Nurse Practitioner

## 2014-02-12 MED ORDER — DROSPIRENONE-ETHINYL ESTRADIOL 3-0.02 MG PO TABS: 1.0000 | ORAL_TABLET | Freq: Every day | ORAL | Status: DC

## 2014-02-12 NOTE — Telephone Encounter (Signed)
Last AEX 03/19/13 #3 packs with 3 refills was sent AEX scheduled for 03/20/14 with PG  Yaz #3 packs with 3 refills sent to pharmacy to [facility] on patient's vm that rx has been sent.  Routed to provider for review, encounter closed.

## 2014-02-12 NOTE — Telephone Encounter (Signed)
Patient is requesting refill of bc aex scheduled for 03/20/14   Pharmacy  [facility] [phone]

## 2014-03-20 ENCOUNTER — Ambulatory Visit (INDEPENDENT_AMBULATORY_CARE_PROVIDER_SITE_OTHER): Payer: BC Managed Care – PPO | Admitting: Nurse Practitioner

## 2014-03-20 ENCOUNTER — Encounter: Payer: Self-pay | Admitting: Nurse Practitioner

## 2014-03-20 VITALS — BP 100/64 | HR 84 | Ht 61.5 in | Wt 116.0 lb

## 2014-03-20 DIAGNOSIS — R82998 Other abnormal findings in urine: Secondary | ICD-10-CM

## 2014-03-20 DIAGNOSIS — R8290 Unspecified abnormal findings in urine: Secondary | ICD-10-CM

## 2014-03-20 DIAGNOSIS — Z0000 Encounter for general adult medical examination without abnormal findings: Secondary | ICD-10-CM

## 2014-03-20 DIAGNOSIS — Z01419 Encounter for gynecological examination (general) (routine) without abnormal findings: Secondary | ICD-10-CM

## 2014-03-20 LAB — POCT URINALYSIS DIPSTICK
BILIRUBIN UA: NEGATIVE
GLUCOSE UA: NEGATIVE
Ketones, UA: NEGATIVE
NITRITE UA: NEGATIVE
Protein, UA: NEGATIVE
RBC UA: NEGATIVE
Urobilinogen, UA: NEGATIVE
pH, UA: 5

## 2014-03-20 LAB — HEMOGLOBIN, FINGERSTICK: HEMOGLOBIN, FINGERSTICK: 12.2 g/dL (ref 12.0–16.0)

## 2014-03-20 MED ORDER — VALACYCLOVIR HCL 1 G PO TABS: 1000.0000 mg | ORAL_TABLET | Freq: Every day | ORAL | Status: DC

## 2014-03-20 MED ORDER — DROSPIRENONE-ETHINYL ESTRADIOL 3-0.02 MG PO TABS: ORAL_TABLET | ORAL | Status: DC

## 2014-03-20 MED ORDER — DROSPIRENONE-ETHINYL ESTRADIOL 3-0.02 MG PO TABS: 1.0000 | ORAL_TABLET | Freq: Every day | ORAL | Status: DC

## 2014-03-20 NOTE — Progress Notes (Signed)
Encounter reviewed by Dr. Yutaka Holberg Silva.  

## 2014-03-20 NOTE — Progress Notes (Signed)
Patient ID: [name], female   DOB: 10-18-1980, 33 y.o.   MRN: [id number] 33 y.o. G0P0 Single Caucasian Fe here for annual exam.  On 12/23/13 had release of right trigger thumb.  Menses normal lasting 4 days moderate to light.  Some cramps. She wants to take continuous active pills during [place] and Reception time.   Same partner for 2 years and getting married 05/13/14 in [place].  Will have reception in [place].  Patient's last menstrual period was 03/14/2014.           Sexually active: yes   The current method of family planning is OCP (estrogen/progesterone).     Exercising: yes  Aerobics, cardio and weights Smoker:  no  Health Maintenance: Pap: 03/19/13, negative  TDaP:  2014 Gardasil:  completed Labs: HB:  12.2  Urine:  Trace leuk's   reports that she quit smoking about 3 years ago. She has never used smokeless tobacco. She reports that she drinks about 2 - 3 ounces of alcohol per week. She reports that she does not use illicit drugs.  Past Medical History  Diagnosis Date  . Trigger thumb of right hand 11/2013  . Dental bridge present     lower front  . Seasonal allergies   . Irritable bowel syndrome (IBS)     Past Surgical History  Procedure Laterality Date  . Colposcopy  2008  . Tonsillectomy  1988  . Trigger finger release Right 12/23/2013    Procedure: RIGHT THUMB TRIGGER RELEASE ;  Surgeon: Tennis Must, MD;  Location: [facility];  Service: Orthopedics;  Laterality: Right;    Current Outpatient Prescriptions  Medication Sig Dispense Refill  . drospirenone-ethinyl estradiol (YAZ) 3-0.02 MG tablet Take 1 tablet by mouth daily.  3 Package  0  . Multiple Vitamin (MULTIVITAMIN) tablet Take 1 tablet by mouth daily.      . valACYclovir (VALTREX) 1000 MG tablet Take 1 tablet (1,000 mg total) by mouth daily.  90 tablet  3   No current facility-administered medications for this visit.    Family History  Problem Relation Age of Onset  . Breast cancer Maternal  Grandmother 28    died early 55's  . Hyperlipidemia Mother   . Heart disease Father   . Cancer Paternal Grandfather     ROS:  Pertinent items are noted in HPI.  Otherwise, a comprehensive ROS was negative.  Exam:   BP 100/64  Pulse 84  Ht 5' 1.5" (1.562 m)  Wt 116 lb (52.617 kg)  BMI 21.57 kg/m2  LMP 03/14/2014 Height: 5' 1.5" (156.2 cm)  Ht Readings from Last 3 Encounters:  03/20/14 5' 1.5" (1.562 m)  12/23/13 5\' 1"  (1.549 m)  12/23/13 5\' 1"  (1.549 m)    General appearance: alert, cooperative and appears stated age Head: Normocephalic, without obvious abnormality, atraumatic Neck: no adenopathy, supple, symmetrical, trachea midline and thyroid normal to inspection and palpation Lungs: clear to auscultation bilaterally Breasts: normal appearance, no masses or tenderness Heart: regular rate and rhythm Abdomen: soft, non-tender; no masses,  no organomegaly Extremities: extremities normal, atraumatic, no cyanosis or edema Skin: Skin color, texture, turgor normal. No rashes or lesions Lymph nodes: Cervical, supraclavicular, and axillary nodes normal. No abnormal inguinal nodes palpated Neurologic: Grossly normal   Pelvic: External genitalia:  no lesions              Urethra:  normal appearing urethra with no masses, tenderness or lesions  Bartholin's and Skene's: normal                 Vagina: normal appearing vagina with normal color and discharge, no lesions              Cervix: anteverted              Pap taken: Yes.   Bimanual Exam:  Uterus:  normal size, contour, position, consistency, mobility, non-tender              Adnexa: no mass, fullness, tenderness               Rectovaginal: Confirms               Anus:  normal sphincter tone, no lesions  A:  Well Woman with normal exam  Contraception   Getting married 05/13/14  History of abnormal pap 2008 with CIN I and neg. Colpo Biopsy   History of HSV II culture proven 2011  R/O UTI - asymptomatic  P:    Reviewed health and wellness pertinent to exam  Pap smear taken today  Follow with urine C&S  Refill on OCP and Valtrex for a year  Directions given for continuous active pills and potential BTB  Counseled on breast self exam, use and side effects of OCP's, adequate intake of calcium and vitamin D, diet and exercise return annually or prn  An After Visit Summary was printed and given to the patient.

## 2014-03-20 NOTE — Patient Instructions (Signed)

## 2014-03-22 LAB — URINE CULTURE
Colony Count: NO GROWTH
ORGANISM ID, BACTERIA: NO GROWTH

## 2014-03-25 LAB — IPS PAP TEST WITH HPV

## 2014-12-05 ENCOUNTER — Telehealth: Payer: Self-pay | Admitting: Nurse Practitioner

## 2014-12-05 NOTE — Telephone Encounter (Signed)
LMTCB about canceled appointment °

## 2014-12-23 ENCOUNTER — Telehealth: Payer: Self-pay | Admitting: Nurse Practitioner

## 2014-12-23 ENCOUNTER — Encounter: Payer: Self-pay | Admitting: Nurse Practitioner

## 2014-12-23 NOTE — Telephone Encounter (Signed)
Call to patient about cx appt with pg voicemail full unable to leave message letter mailed.

## 2015-02-20 HISTORY — PX: REFRACTIVE SURGERY: SHX103

## 2015-03-23 ENCOUNTER — Ambulatory Visit (INDEPENDENT_AMBULATORY_CARE_PROVIDER_SITE_OTHER): Payer: BLUE CROSS/BLUE SHIELD | Admitting: Nurse Practitioner

## 2015-03-23 ENCOUNTER — Encounter: Payer: Self-pay | Admitting: Nurse Practitioner

## 2015-03-23 VITALS — BP 118/72 | HR 80 | Ht 61.25 in | Wt 120.0 lb

## 2015-03-23 DIAGNOSIS — Z0000 Encounter for general adult medical examination without abnormal findings: Secondary | ICD-10-CM

## 2015-03-23 DIAGNOSIS — Z01419 Encounter for gynecological examination (general) (routine) without abnormal findings: Secondary | ICD-10-CM

## 2015-03-23 LAB — POCT URINALYSIS DIPSTICK
Bilirubin, UA: NEGATIVE
GLUCOSE UA: NEGATIVE
KETONES UA: NEGATIVE
LEUKOCYTES UA: NEGATIVE
Nitrite, UA: NEGATIVE
Protein, UA: NEGATIVE
RBC UA: NEGATIVE
Urobilinogen, UA: NEGATIVE
pH, UA: 6

## 2015-03-23 MED ORDER — VALACYCLOVIR HCL 1 G PO TABS: 1000.0000 mg | ORAL_TABLET | Freq: Every day | ORAL | Status: DC

## 2015-03-23 MED ORDER — DROSPIRENONE-ETHINYL ESTRADIOL 3-0.02 MG PO TABS: ORAL_TABLET | ORAL | Status: DC

## 2015-03-23 NOTE — Patient Instructions (Addendum)

## 2015-03-23 NOTE — Progress Notes (Signed)
Patient ID: [name], female   DOB: 12-14-1980, 34 y.o.   MRN: [id number] 34 y.o. G0P0 Married Caucasian Fe here for annual exam.  Menses are still normal not yet thinking about a family.  Flow is light.  Rare outbreak of HSV  Patient's last menstrual period was 02/20/2015 (exact date).          Sexually active: Yes.    The current method of family planning is OCP (estrogen/progesterone).    Exercising: Yes.   Cardio and weights Smoker:  no  Health Maintenance: Pap: 03/20/14, Negative with neg HR HPV TDaP:  2014 Labs:  HB:  12.2    Urine:  Negative     reports that she quit smoking about 4 years ago. She has never used smokeless tobacco. She reports that she drinks about 2.0 - 3.0 oz of alcohol per week. She reports that she does not use illicit drugs.  Past Medical History  Diagnosis Date  . Trigger thumb of right hand 11/2013  . Dental bridge present     lower front  . Seasonal allergies   . Irritable bowel syndrome (IBS)     Past Surgical History  Procedure Laterality Date  . Colposcopy  2008  . Tonsillectomy  1988  . Trigger finger release Right 12/23/2013    Procedure: RIGHT THUMB TRIGGER RELEASE ;  Surgeon: Tennis Must, MD;  Location: [facility];  Service: Orthopedics;  Laterality: Right;  . Nm  renal lasix  2 59f 2 ([facility] hx)  02/2015    Current Outpatient Prescriptions  Medication Sig Dispense Refill  . drospirenone-ethinyl estradiol (YAZ) 3-0.02 MG tablet 1 tablet daily 4 Package 3  . montelukast (SINGULAIR) 10 MG tablet Take 10 mg by mouth at bedtime.    . Multiple Vitamin (MULTIVITAMIN) tablet Take 1 tablet by mouth daily.    . valACYclovir (VALTREX) 1000 MG tablet Take 1 tablet (1,000 mg total) by mouth daily. 90 tablet 3   No current facility-administered medications for this visit.    Family History  Problem Relation Age of Onset  . Breast cancer Maternal Grandmother 28    died early 57's  . Hyperlipidemia Mother   . Heart disease Father    . Cancer Paternal Grandfather     ROS:  Pertinent items are noted in HPI.  Otherwise, a comprehensive ROS was negative.  Exam:   BP 118/72 mmHg  Pulse 80  Ht 5' 1.25" (1.556 m)  Wt 120 lb (54.432 kg)  BMI 22.48 kg/m2  LMP 02/20/2015 (Exact Date) Height: 5' 1.25" (155.6 cm) Ht Readings from Last 3 Encounters:  03/23/15 5' 1.25" (1.556 m)  03/20/14 5' 1.5" (1.562 m)  12/23/13 5\' 1"  (1.549 m)    General appearance: alert, cooperative and appears stated age Head: Normocephalic, without obvious abnormality, atraumatic Neck: no adenopathy, supple, symmetrical, trachea midline and thyroid normal to inspection and palpation Lungs: clear to auscultation bilaterally Breasts: normal appearance, no masses or tenderness Heart: regular rate and rhythm Abdomen: soft, non-tender; no masses,  no organomegaly Extremities: extremities normal, atraumatic, no cyanosis or edema Skin: Skin color, texture, turgor normal. No rashes or lesions Lymph nodes: Cervical, supraclavicular, and axillary nodes normal. No abnormal inguinal nodes palpated Neurologic: Grossly normal   Pelvic: External genitalia:  no lesions              Urethra:  normal appearing urethra with no masses, tenderness or lesions  Bartholin's and Skene's: normal                 Vagina: normal appearing vagina with normal color and discharge, no lesions              Cervix: anteverted              Pap taken: No. Bimanual Exam:  Uterus:  normal size, contour, position, consistency, mobility, non-tender              Adnexa: no mass, fullness, tenderness               Rectovaginal: Confirms               Anus:  normal sphincter tone, no lesions  Chaperone present:  no  A:  Well Woman with normal exam  Contraception  Got married 05/13/14 History of abnormal pap 2008 with CIN I and neg. Colpo Biopsy  History of HSV II culture proven 2011   P:   Reviewed health and  wellness pertinent to exam  Pap smear as above  Refill on Valtrex and Yaz for a year.  If she decides on a pregnancy this next year will call back.   Declined request for Ambien prn - she will discuss with PCP  Counseled on breast self exam, use and side effects of OCP's, adequate intake of calcium and vitamin D, diet and exercise return annually or prn  An After Visit Summary was printed and given to the patient.

## 2015-03-25 LAB — HEMOGLOBIN, FINGERSTICK: Hemoglobin, fingerstick: 12.2 g/dL (ref 12.0–16.0)

## 2015-03-26 ENCOUNTER — Ambulatory Visit: Payer: BC Managed Care – PPO | Admitting: Nurse Practitioner

## 2015-03-27 ENCOUNTER — Ambulatory Visit: Payer: BC Managed Care – PPO | Admitting: Nurse Practitioner

## 2015-03-28 NOTE — Progress Notes (Signed)
Encounter reviewed by Dr. Brook Amundson C. Silva.  

## 2015-04-03 ENCOUNTER — Ambulatory Visit: Payer: Self-pay | Admitting: Nurse Practitioner

## 2015-04-10 ENCOUNTER — Other Ambulatory Visit: Payer: Self-pay | Admitting: Family Medicine

## 2015-04-10 DIAGNOSIS — R1011 Right upper quadrant pain: Secondary | ICD-10-CM

## 2015-04-14 ENCOUNTER — Ambulatory Visit
Admission: RE | Admit: 2015-04-14 | Discharge: 2015-04-14 | Disposition: A | Discharge status: Home / Self Care | Payer: BLUE CROSS/BLUE SHIELD | Source: Ambulatory Visit | Attending: Family Medicine | Admitting: Family Medicine

## 2015-04-14 DIAGNOSIS — R1011 Right upper quadrant pain: Secondary | ICD-10-CM

## 2015-05-15 ENCOUNTER — Telehealth: Payer: Self-pay | Admitting: Nurse Practitioner

## 2015-05-15 NOTE — Telephone Encounter (Signed)
What are her plans for birth control.  Does she need advise about other methods?

## 2015-05-15 NOTE — Telephone Encounter (Signed)
Patient plans to use condoms at this time. Does not desire alternative options. Encounter previously closed.

## 2015-05-15 NOTE — Telephone Encounter (Signed)
Patient has some questions about her birth control. Best callback #: [phone]

## 2015-05-15 NOTE — Telephone Encounter (Signed)
Spoke with patient. Patient states that she would like to come off of her Yaz birth control. Asking when she should stop her pills. Advised patient will need to finish current pack of OCP and have a cycle then okay to discontinue if she would like. Patient is agreeable. States that she skipped her placebo pills last month and started a new pack to skip her cycle. Has been experiencing spotting for two weeks. Denies missing any pills or taking any pills late. Advised BTB is common with taking continuous active pills to skip a cycle. Patient is agreeable.  Routing to provider for final review. Patient agreeable to disposition. Will close encounter.

## 2015-05-16 ENCOUNTER — Other Ambulatory Visit: Payer: Self-pay | Admitting: Nurse Practitioner

## 2015-05-18 NOTE — Telephone Encounter (Signed)
Medication refill request: [place]. Pt is on Yaz Last AEX:  03/23/15 PG Next AEX: 03/23/16 PG  Last MMG (if hormonal medication request): None Refill authorized: 03/23/15 #4packs/ 3R. To [facility]

## 2015-06-23 HISTORY — PX: CHOLECYSTECTOMY: SHX55

## 2015-12-02 DIAGNOSIS — D2239 Melanocytic nevi of other parts of face: Secondary | ICD-10-CM | POA: Diagnosis not present

## 2015-12-28 DIAGNOSIS — J3089 Other allergic rhinitis: Secondary | ICD-10-CM | POA: Diagnosis not present

## 2015-12-28 DIAGNOSIS — H1045 Other chronic allergic conjunctivitis: Secondary | ICD-10-CM | POA: Diagnosis not present

## 2015-12-28 DIAGNOSIS — J3081 Allergic rhinitis due to animal (cat) (dog) hair and dander: Secondary | ICD-10-CM | POA: Diagnosis not present

## 2015-12-28 DIAGNOSIS — J301 Allergic rhinitis due to pollen: Secondary | ICD-10-CM | POA: Diagnosis not present

## 2016-01-27 ENCOUNTER — Telehealth: Payer: Self-pay | Admitting: Nurse Practitioner

## 2016-01-27 ENCOUNTER — Encounter: Payer: Self-pay | Admitting: Nurse Practitioner

## 2016-01-27 ENCOUNTER — Ambulatory Visit (INDEPENDENT_AMBULATORY_CARE_PROVIDER_SITE_OTHER): Payer: BLUE CROSS/BLUE SHIELD | Admitting: Nurse Practitioner

## 2016-01-27 ENCOUNTER — Telehealth: Payer: Self-pay

## 2016-01-27 VITALS — BP 90/60 | HR 70 | Temp 98.1°F | Resp 16 | Ht 61.25 in | Wt 121.0 lb

## 2016-01-27 DIAGNOSIS — R102 Pelvic and perineal pain: Secondary | ICD-10-CM | POA: Diagnosis not present

## 2016-01-27 DIAGNOSIS — N949 Unspecified condition associated with female genital organs and menstrual cycle: Secondary | ICD-10-CM | POA: Diagnosis not present

## 2016-01-27 DIAGNOSIS — N926 Irregular menstruation, unspecified: Secondary | ICD-10-CM

## 2016-01-27 LAB — POCT URINE PREGNANCY: Preg Test, Ur: NEGATIVE

## 2016-01-27 LAB — CBC
HCT: 41.7 % (ref 35.0–45.0)
Hemoglobin: 14.4 g/dL (ref 11.7–15.5)
MCH: 31.4 pg (ref 27.0–33.0)
MCHC: 34.5 g/dL (ref 32.0–36.0)
MCV: 90.8 fL (ref 80.0–100.0)
MPV: 10.2 fL (ref 7.5–12.5)
Platelets: 283 10*3/uL (ref 140–400)
RBC: 4.59 MIL/uL (ref 3.80–5.10)
RDW: 13 % (ref 11.0–15.0)
WBC: 7.5 10*3/uL (ref 3.8–10.8)

## 2016-01-27 LAB — POCT URINALYSIS DIPSTICK
BILIRUBIN UA: NEGATIVE
Glucose, UA: NEGATIVE
KETONES UA: NEGATIVE
Leukocytes, UA: NEGATIVE
Nitrite, UA: NEGATIVE
PH UA: 5
PROTEIN UA: NEGATIVE
RBC UA: NEGATIVE
Urobilinogen, UA: NEGATIVE

## 2016-01-27 LAB — HCG, QUANTITATIVE, PREGNANCY: hCG, Beta Chain, Quant, S: <2 m[IU]/mL

## 2016-01-27 NOTE — Telephone Encounter (Signed)
Left detailed message at number provided [phone], okay per ROI. Advised I have schedule her for a PUS tomorrow at 2 pm here in the office with a 2:30 pm consultation with Dr.Miller per Kem Boroughs, FNP discussion with her in the office today. Advised to return call if she has any further questions or if this appointment date and time will not work well for her. Order placed by Kem Boroughs, FNP for precert.  Cc: Lerry Liner Dr.Miller  Routing to provider for final review. Patient agreeable to disposition. Will close encounter.

## 2016-01-27 NOTE — Telephone Encounter (Signed)
Spoke with patient who states that last week she was experiencing intermittent right sided pain while on her cycle. Reports cycle lasted for 3 days which is unusual for her. Cycles are usually longer then 3 days. Reports after cycle right sided pain only occurred with using the restroom and was sharp. Pain is now intermittent and coming with and without using the restroom. Denies any urinary symptoms, lower back pain, fever, or chills. Advised patient she will need to be seen in the office for further evaluation. She is agreeable. Appointment scheduled for today 01/27/2016 at 12:45 pm with Kem Boroughs, FNP. She is agreeable to date and time.  Routing to provider for final review. Patient agreeable to disposition. Will close encounter.

## 2016-01-27 NOTE — Progress Notes (Signed)
Subjective:     Patient ID: [name], female   DOB: November 27, 1980, 35 y.o.   MRN: [id number]  HPI  This 35 yo G0 P0 WM female presents with sudden onset of RLQ pain of 2 days. States pain is sharp and intense then becomes dull and achy.  With her LMP 01/19/16 she got some 'usual cramps' that were sharp and lasted less than 30 seconds.  Since these were like her normal cramps did not think much about them.  She did have associated increase in flatus but denies changes with bowel habits.  She did not have urinary symptoms.  Her cycle was lighter than usual at 3 days.  But after the cycle she again had cramps yesterday that were more intense on the right side.   At one time felt symptoms were better, but not gone after a BM that was normal.   Currently no diarrhea, constipation, some nausea no vomiting, fatigue.  No fever/ chills.  She has been off OCP since 05/2015.  Occasionally uses withdrawal for birth control.   Review of Systems  Constitutional: Positive for chills, appetite change and fatigue. Negative for fever, diaphoresis and unexpected weight change.  Gastrointestinal: Positive for nausea, abdominal pain and abdominal distention. Negative for vomiting, diarrhea, constipation, blood in stool, anal bleeding and rectal pain.  Genitourinary: Positive for menstrual problem and pelvic pain. Negative for dysuria, urgency, frequency, hematuria, flank pain, decreased urine volume, vaginal bleeding, vaginal discharge, difficulty urinating, genital sores, vaginal pain and dyspareunia.       Objective:   Physical Exam  Constitutional: She is oriented to person, place, and time. She appears well-developed and well-nourished. No distress.  Cardiovascular: Normal rate and regular rhythm.   Pulmonary/Chest: Effort normal and breath sounds normal.  Abdominal: Soft. Bowel sounds are normal. She exhibits distension. She exhibits no mass. There is tenderness. There is no rebound and no guarding.  No flank  pain.  Tenderness is RLQ that is not reproduced on bimanual.  Mild distension.  Non surgical abdomen.  Genitourinary:  Normal vaginal discharge, no cervicitis.  No mass but somewhat tender on right with deep palpation.  Neurological: She is alert and oriented to person, place, and time.  Skin: Skin is warm and dry.  Psychiatric: She has a normal mood and affect. Her behavior is normal. Judgment and thought content normal.       Assessment:     RLQ pain - R/O OV cyst Less likely tubal pregnancy Less likely Appendicitis    Plan:     Will get stat CBC and HCG and follow Will get PUS tomorrow and follow Will have her to CB if symptoms worsen or go to ED     4:45 pm Stat Labs:  CBC with WBC at 7.5, HGB 14.4, and serum HCG was negative.  Called per request and left message on cell phone and told her to proceed as directed with PUS on Thursday.

## 2016-01-27 NOTE — Telephone Encounter (Signed)
Pt STAT labs have returned.  The CBC was normal with WBC at 7.5, HGB 14.4, and serum HCG was negative.  She was left a detained message on her cell phone per request and she is to follow up tomorrow with PUS at 2:00pm.

## 2016-01-27 NOTE — Telephone Encounter (Signed)
Patient having issues with cramping on right side and sharp pains when trying to use the bathroom.

## 2016-01-28 ENCOUNTER — Telehealth: Payer: Self-pay | Admitting: Obstetrics & Gynecology

## 2016-01-28 ENCOUNTER — Ambulatory Visit (INDEPENDENT_AMBULATORY_CARE_PROVIDER_SITE_OTHER): Payer: BLUE CROSS/BLUE SHIELD

## 2016-01-28 ENCOUNTER — Ambulatory Visit (INDEPENDENT_AMBULATORY_CARE_PROVIDER_SITE_OTHER): Payer: BLUE CROSS/BLUE SHIELD | Admitting: Obstetrics & Gynecology

## 2016-01-28 VITALS — BP 100/68 | HR 72 | Resp 20 | Ht 61.0 in | Wt 120.0 lb

## 2016-01-28 DIAGNOSIS — N926 Irregular menstruation, unspecified: Secondary | ICD-10-CM

## 2016-01-28 DIAGNOSIS — K589 Irritable bowel syndrome without diarrhea: Secondary | ICD-10-CM

## 2016-01-28 DIAGNOSIS — R102 Pelvic and perineal pain: Secondary | ICD-10-CM

## 2016-01-28 NOTE — Telephone Encounter (Signed)
Called patient to review benefits for a recommended procedure. Left Voicemail requesting a call back. °

## 2016-01-28 NOTE — Progress Notes (Signed)
34 y.o. G30P0000 Married Caucasian female here for pelvic ultrasound due to RLQ pain that has worsened since stopping her OCPs.  Pt stopped OCPs back in the fall.  Not "actively trying for pregnancy but not actively preventing.  Since that time, she's experienced episodes of sharp RLQ pain that last for about 30 seconds.  These will occur, just at random, for the first two or three days of her cycle. She feels the exact same way when she is sitting on the toilet and having a bowel movement--sharp, shooting, intermittent pain.  Pt also wants to talk about the fact that she and spouse have not achieved pregnancy yet.  She has done some ovulation predictor kits and these have been positive.  Not necessary having timed intercourse.  "Not sure" if she wants to be pregnant, either, so not aggressively moving towards treatment either.  Took PNV for a month or so but then stopped.  Highly advised, she restart these.  Fertility testing including SHGM and semen analysis discussed.  Pt is aware that this can be done at anytime and would be based on her (and husband's desires).  She does not want to pursue anything at this time, just wanted to discuss.     Patient's last menstrual period was 01/19/2016.  Contraception: none  Findings:  UTERUS: 7.0 x 3.9 x 3.2 cm. No evidence of fibroids. EMS: 4.3mm, trilaminar ADNEXA: Left ovary: 2.4 x 2.1 x 1.4 cm       Right ovary: 3.1 x 2.1 x 1.4 cm.  Bilateral follicles are noted no masses are seen. CUL DE SAC: No free fluid.  Discussion:  Findings reviewed with patient. Images reviewed with patient. She understands that there are no visible abnormalities on the ultrasound today. However, endometriosis is not something that can be seen and is a possibility as her symptoms have worsened since she stopped her OCPs. Her symptoms sound like a bowel spasm. She has used medication in the past for this but because the episodes last such short amount of time she doesn't think that  this would actually help. We discussed the possibility of using anti-inflammatories for the first 3 days of her cycle, around-the-clock, to see if this would diminish symptoms as well. She is really not interested in anything except knowing that "everything looks normal ".  I did advise patient to please keep in contact in regards to her pain. If it did worsen, laparoscopy may be indicated with possible laser treatment of endometriosis. We did discuss surgery, possible findings, basic risks, and recovery. She's not interested in this currently  In regards to pregnancy, again, she is not interested in pursuing any treatment options at this time. Isolation testing, cavity assessment tubal patency, as well segment analysis were reviewed. She declined receiving information or the materials necessary for doing a semen analysis at this time. In general, we did review how this is done and she will call if she changes her mind.  Assessment:  Pelvic pain in female that seems to be bowel spasms H/O IBS Off OCPs at this time but not interested in actively pursuing pregnancy Possible endometriosis?  Plan:  Patient is to call with any change in symptoms She also noticed a coffee changes her mind about pursuing fertility evaluation and/or treatment Highly encourage patient to restart her prenatal vitamins  ~25 minutes spent with patient >50% of time was in face to face discussion of above.

## 2016-01-29 ENCOUNTER — Encounter: Payer: Self-pay | Admitting: Obstetrics & Gynecology

## 2016-01-29 DIAGNOSIS — R102 Pelvic and perineal pain: Secondary | ICD-10-CM | POA: Insufficient documentation

## 2016-01-29 DIAGNOSIS — K589 Irritable bowel syndrome without diarrhea: Secondary | ICD-10-CM | POA: Insufficient documentation

## 2016-01-30 NOTE — Progress Notes (Signed)
Encounter reviewed Ferdie Bakken, MD   

## 2016-02-17 DIAGNOSIS — L814 Other melanin hyperpigmentation: Secondary | ICD-10-CM | POA: Diagnosis not present

## 2016-02-17 DIAGNOSIS — D235 Other benign neoplasm of skin of trunk: Secondary | ICD-10-CM | POA: Diagnosis not present

## 2016-03-23 ENCOUNTER — Ambulatory Visit (INDEPENDENT_AMBULATORY_CARE_PROVIDER_SITE_OTHER): Payer: BLUE CROSS/BLUE SHIELD | Admitting: Nurse Practitioner

## 2016-03-23 ENCOUNTER — Encounter: Payer: Self-pay | Admitting: Nurse Practitioner

## 2016-03-23 VITALS — BP 110/72 | HR 72 | Ht 61.25 in | Wt 122.0 lb

## 2016-03-23 DIAGNOSIS — Z01419 Encounter for gynecological examination (general) (routine) without abnormal findings: Secondary | ICD-10-CM | POA: Diagnosis not present

## 2016-03-23 DIAGNOSIS — Z0000 Encounter for general adult medical examination without abnormal findings: Secondary | ICD-10-CM

## 2016-03-23 DIAGNOSIS — R102 Pelvic and perineal pain unspecified side: Secondary | ICD-10-CM

## 2016-03-23 LAB — POCT URINALYSIS DIPSTICK
BILIRUBIN UA: NEGATIVE
Blood, UA: NEGATIVE
GLUCOSE UA: NEGATIVE
Ketones, UA: NEGATIVE
LEUKOCYTES UA: NEGATIVE
NITRITE UA: NEGATIVE
Protein, UA: NEGATIVE
Urobilinogen, UA: NEGATIVE
pH, UA: 5

## 2016-03-23 MED ORDER — IBUPROFEN 800 MG PO TABS: 800.0000 mg | ORAL_TABLET | Freq: Three times a day (TID) | ORAL | 2 refills | Status: DC | PRN

## 2016-03-23 NOTE — Progress Notes (Signed)
Patient ID: [name], female   DOB: 05/08/1981, 35 y.o.   MRN: [id number]  35 y.o. G0P0000 Married  Caucasian Fe here for annual exam.  Menses for 3-4 days. Most of time cycle is moderate to light.  Off OCP since October.  Not planning for a pregnancy but not using any method of Birth control.   Still having a lot of pelvic pain and cramps most of the time.  She really feels if she has endometriosis this has flared since coming off the pill.  She saw Dr. Miller in June and they did discuss maybe doing further testing and possible surgery.   Wants to return and discuss with Dr. Miller in the late fall about pursuing surgical options and treatment for endometriosis if present.  Patient's last menstrual period was 03/21/2016 (exact date).          Sexually active: Yes.    The current method of family planning is none.  Not trying for pregnancy, but not preventing. Exercising: Yes.    Home exercise routine includes weights and cardio 3-4 days per week. Smoker:  no  Health Maintenance: Pap: 03/20/14, Negative with neg HR HPV TDaP: 2014 HIV: 03/19/13 Labs: HB: 01/27/16 = 14.4  Urine: Negative    reports that she quit smoking about 5 years ago. She has never used smokeless tobacco. She reports that she drinks about 2.0 - 3.0 oz of alcohol per week . She reports that she does not use drugs.  Past Medical History:  Diagnosis Date  . Dental bridge present    lower front  . Irritable bowel syndrome (IBS)   . Seasonal allergies   . Trigger thumb of right hand 11/2013    Past Surgical History:  Procedure Laterality Date  . CHOLECYSTECTOMY  06/2015  . COLPOSCOPY  2008  . REFRACTIVE SURGERY Bilateral 02/2015  . TONSILLECTOMY  1988  . TRIGGER FINGER RELEASE Right 12/23/2013   Procedure: RIGHT THUMB TRIGGER RELEASE ;  Surgeon: Kevin R Kuzma, MD;  Location: [facility];  Service: Orthopedics;  Laterality: Right;    Current Outpatient Prescriptions  Medication Sig Dispense Refill  .  fluticasone (FLONASE) 50 MCG/ACT nasal spray Place 2 sprays into both nostrils daily.    . ibuprofen (ADVIL,MOTRIN) 800 MG tablet Take 1 tablet (800 mg total) by mouth every 8 (eight) hours as needed. 30 tablet 2  . montelukast (SINGULAIR) 10 MG tablet Take 10 mg by mouth at bedtime.    . Multiple Vitamin (MULTIVITAMIN) tablet Take 1 tablet by mouth daily.    . valACYclovir (VALTREX) 1000 MG tablet Take 1 tablet (1,000 mg total) by mouth daily. 90 tablet 3   No current facility-administered medications for this visit.     Family History  Problem Relation Age of Onset  . Breast cancer Maternal Grandmother 28    died early 30's  . Hyperlipidemia Mother   . Heart disease Father   . Cancer Paternal Grandfather     ROS:  Pertinent items are noted in HPI.  Otherwise, a comprehensive ROS was negative.  Exam:   BP 110/72 (BP Location: Right Arm, Patient Position: Sitting, Cuff Size: Normal)   Pulse 72   Ht 5' 1.25" (1.556 m)   Wt 122 lb (55.3 kg)   LMP 03/21/2016 (Exact Date)   BMI 22.86 kg/m  Height: 5' 1.25" (155.6 cm) Ht Readings from Last 3 Encounters:  03/23/16 5' 1.25" (1.556 m)  01/28/16 5' 1" (1.549 m)  01/27/16 5' 1.25" (  1.556 m)    General appearance: alert, cooperative and appears stated age Head: Normocephalic, without obvious abnormality, atraumatic Neck: no adenopathy, supple, symmetrical, trachea midline and thyroid normal to inspection and palpation Lungs: clear to auscultation bilaterally Breasts: normal appearance, no masses or tenderness Heart: regular rate and rhythm Abdomen: soft, non-tender; no masses,  no organomegaly Extremities: extremities normal, atraumatic, no cyanosis or edema Skin: Skin color, texture, turgor normal. No rashes or lesions Lymph nodes: Cervical, supraclavicular, and axillary nodes normal. No abnormal inguinal nodes palpated Neurologic: Grossly normal   Pelvic: External genitalia:  no lesions              Urethra:  normal appearing  urethra with no masses, tenderness or lesions              Bartholin's and Skene's: normal                 Vagina: normal appearing vagina with normal color and discharge, no lesions              Cervix: anteverted              Pap taken: Yes.   Bimanual Exam:  Uterus:  normal size, contour, position, consistency, mobility, non-tender              Adnexa: no mass, fullness, tenderness               Rectovaginal: Confirms               Anus:  normal sphincter tone, no lesions  Chaperone present: yes  A:  Well Woman with normal exam  Contraception - none at present  Got married 05/13/14  History of suspected endometriosis History of abnormal pap 2008 with CIN I and neg. Colpo Biopsy  History of HSV II culture proven 2011  [facility] of Breast cancer - MGM at age 60 died at 45   P:   Reviewed health and wellness pertinent to exam  Pap smear as above  Mammogram - she will get baseline based on [facility]  She is given information about BRCA testing  She is given RX for Motrin 800 mg TID prn  Counseled on breast self exam, mammography screening, adequate intake of calcium and vitamin D, diet and exercise return annually or prn  An After Visit Summary was printed and given to the patient.

## 2016-03-23 NOTE — Patient Instructions (Signed)

## 2016-03-24 NOTE — Progress Notes (Signed)
Encounter reviewed Jill Jertson, MD   

## 2016-03-25 LAB — IPS PAP TEST WITH HPV

## 2016-03-27 DIAGNOSIS — J069 Acute upper respiratory infection, unspecified: Secondary | ICD-10-CM | POA: Diagnosis not present

## 2016-03-29 ENCOUNTER — Telehealth: Payer: Self-pay | Admitting: Nurse Practitioner

## 2016-03-29 NOTE — Telephone Encounter (Signed)
Patient is on antibiotics and would like a prescription for diflucan called in to [facility] on [address] at [phone].

## 2016-03-30 NOTE — Telephone Encounter (Signed)
Spoke to patient who states she was diagnosed with strep throat at urgent care this weekend and given amoxicillin.  She requested Diflucan at the time and has called back to their office and was told they would send it in.  This has not been done.  Patient would like to know if we will call in medication for her.  Also, pt states she woke up last night in terrible pain due to suspected endometriosis.  She laid on couch with heating pad and took ibuprofen and pain eventually went away so she could sleep.  Patient has an appointment with Dr. Sabra Heck in December to discuss options and follow up, but wants to know if this appointment needs to be moved up.  Symptoms are getting worse and more frequent.  Please advise medication and follow up appointment.

## 2016-03-30 NOTE — Telephone Encounter (Signed)
Left message to call [name] at [phone]. 

## 2016-03-31 ENCOUNTER — Other Ambulatory Visit: Payer: Self-pay | Admitting: Nurse Practitioner

## 2016-03-31 MED ORDER — FLUCONAZOLE 150 MG PO TABS: 150.0000 mg | ORAL_TABLET | Freq: Once | ORAL | 0 refills | Status: AC

## 2016-03-31 NOTE — Telephone Encounter (Signed)
Med has been sent to her pharmacy.  I do think It is reasonable for her to come in earlier than December to be seen.

## 2016-04-01 NOTE — Telephone Encounter (Signed)
Message left for patient on cell voicemail advising that medication had been called to pharmacy and to return call and we can move up December.  Request call from patient to reschedule appointment.

## 2016-04-11 NOTE — Telephone Encounter (Signed)
Patient returning call.

## 2016-04-19 NOTE — Telephone Encounter (Signed)
Pt scheduled for 04/29/16 with Dr. Sabra Heck.

## 2016-04-29 ENCOUNTER — Encounter: Payer: Self-pay | Admitting: Obstetrics & Gynecology

## 2016-04-29 ENCOUNTER — Ambulatory Visit (INDEPENDENT_AMBULATORY_CARE_PROVIDER_SITE_OTHER): Payer: BLUE CROSS/BLUE SHIELD | Admitting: Obstetrics & Gynecology

## 2016-04-29 VITALS — BP 100/80 | HR 88 | Resp 14 | Ht 61.25 in | Wt 123.0 lb

## 2016-04-29 DIAGNOSIS — R102 Pelvic and perineal pain: Secondary | ICD-10-CM

## 2016-04-29 DIAGNOSIS — R5383 Other fatigue: Secondary | ICD-10-CM | POA: Diagnosis not present

## 2016-04-29 DIAGNOSIS — K589 Irritable bowel syndrome without diarrhea: Secondary | ICD-10-CM | POA: Diagnosis not present

## 2016-04-29 LAB — CBC
HEMATOCRIT: 41.1 % (ref 35.0–45.0)
Hemoglobin: 13.9 g/dL (ref 11.7–15.5)
MCH: 31 pg (ref 27.0–33.0)
MCHC: 33.8 g/dL (ref 32.0–36.0)
MCV: 91.5 fL (ref 80.0–100.0)
MPV: 10 fL (ref 7.5–12.5)
PLATELETS: 309 10*3/uL (ref 140–400)
RBC: 4.49 MIL/uL (ref 3.80–5.10)
RDW: 13 % (ref 11.0–15.0)
WBC: 9.4 10*3/uL (ref 3.8–10.8)

## 2016-04-29 LAB — COMPREHENSIVE METABOLIC PANEL
ALK PHOS: 86 U/L (ref 33–115)
ALT: 14 U/L (ref 6–29)
AST: 19 U/L (ref 10–30)
Albumin: 5 g/dL (ref 3.6–5.1)
BILIRUBIN TOTAL: 0.6 mg/dL (ref 0.2–1.2)
BUN: 11 mg/dL (ref 7–25)
CO2: 28 mmol/L (ref 20–31)
CREATININE: 0.62 mg/dL (ref 0.50–1.10)
Calcium: 10.2 mg/dL (ref 8.6–10.2)
Chloride: 99 mmol/L (ref 98–110)
GLUCOSE: 99 mg/dL (ref 65–99)
Potassium: 4.6 mmol/L (ref 3.5–5.3)
Sodium: 137 mmol/L (ref 135–146)
TOTAL PROTEIN: 7.8 g/dL (ref 6.1–8.1)

## 2016-04-29 LAB — TSH: TSH: 1.61 m[IU]/L

## 2016-04-29 NOTE — Progress Notes (Signed)
GYNECOLOGY  VISIT   HPI: 35 y.o. 160P0000 Married Caucasian female here for follow up and to discuss pelvic pain that has worsened since coming off her OCPs.  She stopped these in October, 2016.  Since that time, her pain seems to continue to worsen.  Feels pain starts with ovulation.  Pt reports after ovulation, pain seems to improve for a few days.  Pain typically worsens right before her cycle starts and then improves with her bleeding.  She really does not want to be back on OCPs at this time.  Is not actively trying for pregnancy but considering it.  No on PNVs at this time.  Pt reports she also feels fatigue "all of the time".  Not sure if this is related.  Would like to have some blood work done.  Feels she sleeps well.  Does have some stressors in life at this time.  We have discussed laparoscopy in the past and she would like to discuss this again.  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  Procedure, possible findings, treatment with laser and possible chromopertubation discussed.  Hospital stay, recovery and pain management all discussed.  Incision locations discussed.  Risks discussed including but not limited to bleeding, <1% risk of receiving a  transfusion, infection, 1-2% risk of bowel/bladder/ureteral/vascular injury discussed as well as possible need for additional surgery if injury does occur discussed.  DVT/PE and rare risk of death discussed.  My actual complications with prior surgeries discussed.  Positioning discussed.  Patient aware if endometriosis is present, will also try to obtain tissue for biopsy for diagnostic purposes as well.  She is aware procedure could be completely negative and then urology or GI follow up would be needed.   Pt has specific questions about type of laser that would be used to try endometriosis.  At this point, J plasma laser would be used.  Information provided as well as videos to watch shared with pt.  All questions answered.  She would like to proceed  with scheduling.   GYNECOLOGIC HISTORY: Patient's last menstrual period was 04/19/2016. Contraception: none  Patient Active Problem List   Diagnosis Date Noted  . Pelvic pain in female 01/29/2016  . Spasm of bowel 01/29/2016  . IBS (irritable bowel syndrome) 01/29/2016    Past Medical History:  Diagnosis Date  . Dental bridge present    lower front  . Irritable bowel syndrome (IBS)   . Seasonal allergies   . Trigger thumb of right hand 11/2013    Past Surgical History:  Procedure Laterality Date  . CHOLECYSTECTOMY  06/2015  . COLPOSCOPY  2008  . REFRACTIVE SURGERY Bilateral 02/2015  . TONSILLECTOMY  1988  . TRIGGER FINGER RELEASE Right 12/23/2013   Procedure: RIGHT THUMB TRIGGER RELEASE ;  Surgeon: Tami RibasKevin R Kuzma, MD;  Location: [facility];  Service: Orthopedics;  Laterality: Right;    MEDS:  Reviewed in EPIC and UTD  ALLERGIES: Review of patient's allergies indicates no known allergies.  Family History  Problem Relation Age of Onset  . Breast cancer Maternal Grandmother 28    died early 6230's  . Hyperlipidemia Mother   . Heart disease Father   . Cancer Paternal Grandfather    SH:  Married, non smoker  Review of Systems  Constitutional: Positive for malaise/fatigue. Negative for weight loss.  All other systems reviewed and are negative.   PHYSICAL EXAMINATION:    BP 100/80 (BP Location: Right Arm, Patient Position: Sitting, Cuff Size: Normal)  Pulse 88   Resp 14   Ht 5' 1.25" (1.556 m)   Wt 123 lb (55.8 kg)   LMP 04/19/2016   BMI 23.05 kg/m     General appearance: alert, cooperative and appears stated age Neck: no adenopathy, supple, symmetrical, trachea midline and thyroid normal to inspection and palpation CV:  Regular rate and rhythm Lungs:  clear to auscultation, no wheezes, rales or rhonchi, symmetric air entry Abdomen: soft, non-tender; bowel sounds normal; no masses,  no organomegaly  Pelvic: External genitalia:  no lesions               Urethra:  normal appearing urethra with no masses, tenderness or lesions              Bartholins and Skenes: normal                 Vagina: normal appearing vagina with normal color and discharge, no lesions              Cervix: no lesions              Bimanual Exam:  Uterus:  normal size, contour, position, consistency, mobility, non-tender              Adnexa: no mass, fullness, tenderness  Chaperone was present for exam.  Assessment: Female pelvic pain Possible endometriosis H/O bowel spasms Contemplating pregnancy Fatigue  Plan: Will plan operative laparoscopy with possible treatment of endometriosis with j plasma laser, chromopertubation will also be planned.  Pt ready to proceed with scheduling.   CBC, CMP, TSH, and Vit D lab work will be obtained today.   ~30 minutes spent with patient >50% of time was in face to face discussion of above.

## 2016-04-30 LAB — VITAMIN D 25 HYDROXY (VIT D DEFICIENCY, FRACTURES): VIT D 25 HYDROXY: 26 ng/mL — AB (ref 30–100)

## 2016-05-06 ENCOUNTER — Telehealth: Payer: Self-pay | Admitting: Obstetrics & Gynecology

## 2016-05-06 NOTE — Telephone Encounter (Signed)
Patient is asking for her recent lab results. °

## 2016-05-06 NOTE — Telephone Encounter (Signed)
Routing to [place] for review of lab results from 04/29/2016.

## 2016-05-06 NOTE — Telephone Encounter (Signed)
Spoke with patient. Advised patient TSH, CMP, and CBC are all normal. Advised vitamin d level is low at 26 and she will need to begin taking Vitamin D 2,000 IU daily. Vitamin D level will be rechecked at her next aex. Patient is agreeable and verbalizes understanding.  Routing to provider for final review.

## 2016-05-06 NOTE — Telephone Encounter (Signed)
Agree with recommendations given.  Encounter closed.

## 2016-05-10 ENCOUNTER — Telehealth: Payer: Self-pay | Admitting: Obstetrics & Gynecology

## 2016-05-10 NOTE — Telephone Encounter (Signed)
Second attempt to call patient.  Left message regarding specific dates of 05-24-16 or 05-30-16 as first available options for scheduling surgery. Left message that these are not only options but just the first available options. Can call back and let me know if either of these works for her.

## 2016-05-10 NOTE — Telephone Encounter (Signed)
Called patient to review benefits for a recommended procedure. Left Voicemail requesting a call back. °

## 2016-05-10 NOTE — Telephone Encounter (Signed)
Patient is ready to schedule surgery

## 2016-05-10 NOTE — Telephone Encounter (Signed)
Call to patient regarding surgery date preferences and scheduling. Per ROI, can leave message on [phone]. Left message to call back.

## 2016-05-11 NOTE — Telephone Encounter (Signed)
Patient called back with preferred date. Aware she will be contacted this afternoon for further discussion.  Patient prefers 05/30/16.  Best this afternoon [phone]

## 2016-05-11 NOTE — Telephone Encounter (Signed)
Call to patient. Advised surgery scheduled for 05-30-16 at 0730 at [facility] as requested.  Patient advise to arrive at 0600 unless hospital directs otherwise, and is aware times subject to change. Surgery instruction sheet reviewed and printed copy will be mailed to patient, see copy scanned to chart.   Routing to provider for final review. Patient agreeable to disposition. Will close encounter.

## 2016-05-16 NOTE — Patient Instructions (Addendum)
Your procedure is scheduled on:  Monday, Oct. 9, 2017  Enter through the [employer] [facility] at:  6:00 AM  Pick up the phone at the desk and dial [phone].  Call this number if you have problems the morning of surgery: [phone].  Remember: Do NOT eat food or drink after:  Midnight Sunday, Oct. 8, 2017  Take these medicines the morning of surgery with a SIP OF WATER:  None  Do NOT wear jewelry (body piercing), metal hair clips/bobby pins, make-up, or nail polish. Do NOT wear lotions, powders, or perfumes.  You may wear deodorant. Do NOT shave for 48 hours prior to surgery. Do NOT bring valuables to the hospital. Contacts, dentures, or bridgework may not be worn into surgery.  Have a responsible adult drive you home and stay with you for 24 hours after your procedure

## 2016-05-17 ENCOUNTER — Other Ambulatory Visit: Payer: Self-pay | Admitting: Obstetrics & Gynecology

## 2016-05-17 ENCOUNTER — Encounter (HOSPITAL_COMMUNITY): Payer: Self-pay

## 2016-05-17 ENCOUNTER — Encounter (HOSPITAL_COMMUNITY)
Admission: RE | Admit: 2016-05-17 | Discharge: 2016-05-17 | Disposition: A | Discharge status: Home / Self Care | Payer: BLUE CROSS/BLUE SHIELD | Source: Ambulatory Visit | Attending: Obstetrics & Gynecology | Admitting: Obstetrics & Gynecology

## 2016-05-17 DIAGNOSIS — Z01812 Encounter for preprocedural laboratory examination: Secondary | ICD-10-CM | POA: Insufficient documentation

## 2016-05-17 DIAGNOSIS — R5383 Other fatigue: Secondary | ICD-10-CM | POA: Insufficient documentation

## 2016-05-17 DIAGNOSIS — R102 Pelvic and perineal pain: Secondary | ICD-10-CM | POA: Insufficient documentation

## 2016-05-17 DIAGNOSIS — Z9889 Other specified postprocedural states: Secondary | ICD-10-CM | POA: Diagnosis not present

## 2016-05-17 HISTORY — DX: Dry eye syndrome of bilateral lacrimal glands: H04.123

## 2016-05-17 HISTORY — DX: Headache, unspecified: R51.9

## 2016-05-17 HISTORY — DX: Headache: R51

## 2016-05-17 LAB — CBC
HEMATOCRIT: 36 % (ref 36.0–46.0)
Hemoglobin: 12.9 g/dL (ref 12.0–15.0)
MCH: 31.8 pg (ref 26.0–34.0)
MCHC: 35.8 g/dL (ref 30.0–36.0)
MCV: 88.7 fL (ref 78.0–100.0)
PLATELETS: 254 10*3/uL (ref 150–400)
RBC: 4.06 MIL/uL (ref 3.87–5.11)
RDW: 12.4 % (ref 11.5–15.5)
WBC: 7.4 10*3/uL (ref 4.0–10.5)

## 2016-05-29 NOTE — Anesthesia Preprocedure Evaluation (Addendum)
Anesthesia Evaluation  Patient identified by MRN, date of birth, ID band Patient awake    Reviewed: Allergy & Precautions, NPO status , Patient's Chart, lab work & pertinent test results  History of Anesthesia Complications Negative for: history of anesthetic complications  Airway Mallampati: II  TM Distance: >3 FB Neck ROM: Full    Dental  (+) Teeth Intact, Dental Advisory Given Bridge on lower front teeth:   Pulmonary neg shortness of breath, neg sleep apnea, neg COPD, neg recent URI, former smoker,    Pulmonary exam normal breath sounds clear to auscultation       Cardiovascular negative cardio ROS   Rhythm:Regular Rate:Normal     Neuro/Psych  Headaches, neg Seizures    GI/Hepatic Neg liver ROS, neg GERD  ,IBS   Endo/Other  negative endocrine ROS  Renal/GU negative Renal ROS     Musculoskeletal   Abdominal (+) - obese,   Peds  Hematology negative hematology ROS (+)   Anesthesia Other Findings Pelvic pain  Reproductive/Obstetrics                            Anesthesia Physical Anesthesia Plan  ASA: II  Anesthesia Plan: General   Post-op Pain Management:    Induction: Intravenous  Airway Management Planned: Oral ETT  Additional Equipment:   Intra-op Plan:   Post-operative Plan: Extubation in OR  Informed Consent: I have reviewed the patients History and Physical, chart, labs and discussed the procedure including the risks, benefits and alternatives for the proposed anesthesia with the patient or authorized representative who has indicated his/her understanding and acceptance.   Dental advisory given  Plan Discussed with: CRNA  Anesthesia Plan Comments: (Risks of general anesthesia discussed including, but not limited to, sore throat, hoarse voice, chipped/damaged teeth, injury to vocal cords, nausea and vomiting, allergic reactions, lung infection, heart attack, stroke,  and death. All questions answered. )       Anesthesia Quick Evaluation

## 2016-05-30 ENCOUNTER — Ambulatory Visit (HOSPITAL_COMMUNITY): Payer: BLUE CROSS/BLUE SHIELD | Admitting: Anesthesiology

## 2016-05-30 ENCOUNTER — Ambulatory Visit (HOSPITAL_COMMUNITY)
Admission: RE | Admit: 2016-05-30 | Discharge: 2016-05-30 | Disposition: A | Discharge status: Home / Self Care | Payer: BLUE CROSS/BLUE SHIELD | Source: Ambulatory Visit | Attending: Obstetrics & Gynecology | Admitting: Obstetrics & Gynecology

## 2016-05-30 ENCOUNTER — Encounter (HOSPITAL_COMMUNITY)
Admission: RE | Disposition: A | Discharge status: Home / Self Care | Payer: Self-pay | Source: Ambulatory Visit | Attending: Obstetrics & Gynecology

## 2016-05-30 ENCOUNTER — Encounter (HOSPITAL_COMMUNITY): Payer: Self-pay | Admitting: Emergency Medicine

## 2016-05-30 DIAGNOSIS — R102 Pelvic and perineal pain: Secondary | ICD-10-CM | POA: Diagnosis not present

## 2016-05-30 DIAGNOSIS — Z79899 Other long term (current) drug therapy: Secondary | ICD-10-CM | POA: Diagnosis not present

## 2016-05-30 DIAGNOSIS — K589 Irritable bowel syndrome without diarrhea: Secondary | ICD-10-CM | POA: Diagnosis not present

## 2016-05-30 DIAGNOSIS — N803 Endometriosis of pelvic peritoneum: Secondary | ICD-10-CM | POA: Insufficient documentation

## 2016-05-30 DIAGNOSIS — Z87891 Personal history of nicotine dependence: Secondary | ICD-10-CM | POA: Diagnosis not present

## 2016-05-30 HISTORY — PX: LAPAROSCOPY: SHX197

## 2016-05-30 HISTORY — PX: CHROMOPERTUBATION: SHX6288

## 2016-05-30 LAB — HCG, SERUM, QUALITATIVE: PREG SERUM: NEGATIVE

## 2016-05-30 SURGERY — LAPAROSCOPY OPERATIVE
Anesthesia: General | Site: Vagina

## 2016-05-30 MED ORDER — NEOSTIGMINE METHYLSULFATE 10 MG/10ML IV SOLN
INTRAVENOUS | Status: AC
  Filled 2016-05-30: qty 1

## 2016-05-30 MED ORDER — GLYCOPYRROLATE 0.2 MG/ML IJ SOLN
INTRAMUSCULAR | Status: DC | PRN
  Administered 2016-05-30: 0.4 mg via INTRAVENOUS

## 2016-05-30 MED ORDER — HYDROCODONE-ACETAMINOPHEN 5-325 MG PO TABS: 1.0000 | ORAL_TABLET | Freq: Four times a day (QID) | ORAL | 0 refills | Status: DC | PRN

## 2016-05-30 MED ORDER — NEOSTIGMINE METHYLSULFATE 10 MG/10ML IV SOLN
INTRAVENOUS | Status: DC | PRN
  Administered 2016-05-30: 3 mg via INTRAVENOUS

## 2016-05-30 MED ORDER — IBUPROFEN 800 MG PO TABS: 800.0000 mg | ORAL_TABLET | Freq: Three times a day (TID) | ORAL | 0 refills | Status: DC | PRN

## 2016-05-30 MED ORDER — FENTANYL CITRATE (PF) 100 MCG/2ML IJ SOLN
INTRAMUSCULAR | Status: DC
Start: 2016-05-30 — End: 2016-05-30
  Filled 2016-05-30: qty 2

## 2016-05-30 MED ORDER — KETOROLAC TROMETHAMINE 30 MG/ML IJ SOLN
INTRAMUSCULAR | Status: DC | PRN
  Administered 2016-05-30: 30 mg via INTRAMUSCULAR
  Administered 2016-05-30: 30 mg via INTRAVENOUS

## 2016-05-30 MED ORDER — ROCURONIUM BROMIDE 100 MG/10ML IV SOLN
INTRAVENOUS | Status: DC | PRN
  Administered 2016-05-30: 40 mg via INTRAVENOUS

## 2016-05-30 MED ORDER — DEXAMETHASONE SODIUM PHOSPHATE 10 MG/ML IJ SOLN
INTRAMUSCULAR | Status: DC | PRN
  Administered 2016-05-30: 4 mg via INTRAVENOUS

## 2016-05-30 MED ORDER — DEXAMETHASONE SODIUM PHOSPHATE 4 MG/ML IJ SOLN
INTRAMUSCULAR | Status: AC
  Filled 2016-05-30: qty 1

## 2016-05-30 MED ORDER — GLYCOPYRROLATE 0.2 MG/ML IJ SOLN
INTRAMUSCULAR | Status: AC
Start: 2016-05-30 — End: 2016-05-30
  Filled 2016-05-30: qty 2

## 2016-05-30 MED ORDER — ONDANSETRON HCL 4 MG/2ML IJ SOLN
INTRAMUSCULAR | Status: AC
  Filled 2016-05-30: qty 2

## 2016-05-30 MED ORDER — SCOPOLAMINE 1 MG/3DAYS TD PT72
MEDICATED_PATCH | TRANSDERMAL | Status: AC
  Administered 2016-05-30: 1.5 mg via TRANSDERMAL
  Filled 2016-05-30: qty 1

## 2016-05-30 MED ORDER — FENTANYL CITRATE (PF) 100 MCG/2ML IJ SOLN
25.0000 ug | INTRAMUSCULAR | Status: DC | PRN
  Administered 2016-05-30: 25 ug via INTRAVENOUS

## 2016-05-30 MED ORDER — KETOROLAC TROMETHAMINE 30 MG/ML IJ SOLN
INTRAMUSCULAR | Status: AC
  Filled 2016-05-30: qty 1

## 2016-05-30 MED ORDER — ONDANSETRON HCL 4 MG/2ML IJ SOLN
INTRAMUSCULAR | Status: DC | PRN
  Administered 2016-05-30: 4 mg via INTRAVENOUS

## 2016-05-30 MED ORDER — LACTATED RINGERS IV SOLN
INTRAVENOUS | Status: DC
  Administered 2016-05-30 (×2): via INTRAVENOUS

## 2016-05-30 MED ORDER — BUPIVACAINE HCL (PF) 0.25 % IJ SOLN
INTRAMUSCULAR | Status: DC | PRN
  Administered 2016-05-30: 10 mL

## 2016-05-30 MED ORDER — METHYLENE BLUE 0.5 % INJ SOLN
INTRAVENOUS | Status: AC
  Filled 2016-05-30: qty 10

## 2016-05-30 MED ORDER — MIDAZOLAM HCL 2 MG/2ML IJ SOLN
INTRAMUSCULAR | Status: AC
  Filled 2016-05-30: qty 2

## 2016-05-30 MED ORDER — PROPOFOL 10 MG/ML IV BOLUS
INTRAVENOUS | Status: AC
  Filled 2016-05-30: qty 20

## 2016-05-30 MED ORDER — ROCURONIUM BROMIDE 100 MG/10ML IV SOLN
INTRAVENOUS | Status: AC
  Filled 2016-05-30: qty 1

## 2016-05-30 MED ORDER — FENTANYL CITRATE (PF) 250 MCG/5ML IJ SOLN
INTRAMUSCULAR | Status: AC
  Filled 2016-05-30: qty 5

## 2016-05-30 MED ORDER — LIDOCAINE HCL (CARDIAC) 20 MG/ML IV SOLN
INTRAVENOUS | Status: AC
  Filled 2016-05-30: qty 5

## 2016-05-30 MED ORDER — PROPOFOL 10 MG/ML IV BOLUS
INTRAVENOUS | Status: DC | PRN
  Administered 2016-05-30: 150 mg via INTRAVENOUS

## 2016-05-30 MED ORDER — FENTANYL CITRATE (PF) 100 MCG/2ML IJ SOLN
INTRAMUSCULAR | Status: DC | PRN
  Administered 2016-05-30 (×2): 50 ug via INTRAVENOUS
  Administered 2016-05-30 (×2): 25 ug via INTRAVENOUS
  Administered 2016-05-30 (×2): 50 ug via INTRAVENOUS

## 2016-05-30 MED ORDER — MIDAZOLAM HCL 2 MG/2ML IJ SOLN
INTRAMUSCULAR | Status: DC | PRN
  Administered 2016-05-30: 2 mg via INTRAVENOUS

## 2016-05-30 MED ORDER — LIDOCAINE HCL (CARDIAC) 20 MG/ML IV SOLN
INTRAVENOUS | Status: DC | PRN
  Administered 2016-05-30: 50 mg via INTRAVENOUS

## 2016-05-30 MED ORDER — METHYLENE BLUE 0.5 % INJ SOLN
INTRAVENOUS | Status: DC | PRN
  Administered 2016-05-30: 30 mL via SUBMUCOSAL

## 2016-05-30 MED ORDER — SCOPOLAMINE 1 MG/3DAYS TD PT72
1.0000 | MEDICATED_PATCH | Freq: Once | TRANSDERMAL | Status: DC
  Administered 2016-05-30: 1.5 mg via TRANSDERMAL

## 2016-05-30 MED ORDER — PROMETHAZINE HCL 25 MG/ML IJ SOLN: 6.2500 mg | INTRAMUSCULAR | Status: DC | PRN

## 2016-05-30 MED ORDER — BUPIVACAINE HCL (PF) 0.25 % IJ SOLN
INTRAMUSCULAR | Status: AC
  Filled 2016-05-30: qty 30

## 2016-05-30 SURGICAL SUPPLY — 40 items
APPLICATOR ARISTA FLEXITIP XL (MISCELLANEOUS) IMPLANT
BENZOIN TINCTURE PRP APPL 2/3 (GAUZE/BANDAGES/DRESSINGS) IMPLANT
CABLE HIGH FREQUENCY MONO STRZ (ELECTRODE) IMPLANT
CATH ROBINSON RED A/P 16FR (CATHETERS) IMPLANT
CLOTH BEACON ORANGE TIMEOUT ST (SAFETY) ×3 IMPLANT
DRSG OPSITE POSTOP 3X4 (GAUZE/BANDAGES/DRESSINGS) IMPLANT
DURAPREP 26ML APPLICATOR (WOUND CARE) ×3 IMPLANT
GLOVE BIOGEL PI IND STRL 7.0 (GLOVE) ×8 IMPLANT
GLOVE BIOGEL PI INDICATOR 7.0 (GLOVE) ×4
GLOVE ECLIPSE 6.5 STRL STRAW (GLOVE) ×6 IMPLANT
GOWN STRL REUS W/TWL LRG LVL3 (GOWN DISPOSABLE) ×3 IMPLANT
HEMOSTAT ARISTA ABSORB 3G PWDR (MISCELLANEOUS) IMPLANT
IV STOPCOCK 4 WAY 40  W/Y SET (IV SOLUTION) ×1
IV STOPCOCK 4 WAY 40 W/Y SET (IV SOLUTION) ×2 IMPLANT
LIQUID BAND (GAUZE/BANDAGES/DRESSINGS) ×3 IMPLANT
MANIPULATOR UTERINE 4.5 ZUMI (MISCELLANEOUS) ×3 IMPLANT
NEEDLE INSUFFLATION 120MM (ENDOMECHANICALS) ×3 IMPLANT
PACK LAPAROSCOPY BASIN (CUSTOM PROCEDURE TRAY) ×3 IMPLANT
PACK TRENDGUARD 450 HYBRID PRO (MISCELLANEOUS) ×2 IMPLANT
PACK TRENDGUARD 600 HYBRD PROC (MISCELLANEOUS) IMPLANT
POUCH SPECIMEN RETRIEVAL 10MM (ENDOMECHANICALS) IMPLANT
PROTECTOR NERVE ULNAR (MISCELLANEOUS) ×6 IMPLANT
SEALER TISSUE G2 CVD JAW 35 (ENDOMECHANICALS) IMPLANT
SEALER TISSUE G2 CVD JAW 45CM (ENDOMECHANICALS)
SET IRRIG TUBING LAPAROSCOPIC (IRRIGATION / IRRIGATOR) IMPLANT
SLEEVE XCEL OPT CAN 5 100 (ENDOMECHANICALS) ×3 IMPLANT
STRIP CLOSURE SKIN 1/2X4 (GAUZE/BANDAGES/DRESSINGS) IMPLANT
SUT VICRYL 0 UR6 27IN ABS (SUTURE) IMPLANT
SUT VICRYL 4-0 PS2 18IN ABS (SUTURE) ×3 IMPLANT
SYR 30ML LL (SYRINGE) IMPLANT
SYR 50ML LL SCALE MARK (SYRINGE) ×3 IMPLANT
TOWEL OR 17X24 6PK STRL BLUE (TOWEL DISPOSABLE) ×6 IMPLANT
TRAY FOLEY CATH SILVER 14FR (SET/KITS/TRAYS/PACK) ×3 IMPLANT
TRENDGUARD 450 HYBRID PRO PACK (MISCELLANEOUS) ×3
TRENDGUARD 600 HYBRID PROC PK (MISCELLANEOUS)
TROCAR ADV FIXATION 5X100MM (TROCAR) ×3 IMPLANT
TROCAR BALLN 12MMX100 BLUNT (TROCAR) IMPLANT
TROCAR XCEL NON-BLD 11X100MML (ENDOMECHANICALS) IMPLANT
WARMER LAPAROSCOPE (MISCELLANEOUS) ×3 IMPLANT
WATER STERILE IRR 1000ML POUR (IV SOLUTION) IMPLANT

## 2016-05-30 NOTE — Transfer of Care (Signed)
Immediate Anesthesia Transfer of Care Note  Patient: [name]  Procedure(s) Performed: Procedure(s): LAPAROSCOPY OPERATIVE WITH LYSIS OF ADHESIONS, EXCISION OF POSSIBLE ENDOMETRIOSIS (N/A) CHROMOPERTUBATION (N/A)  Patient Location: PACU  Anesthesia Type:General  Level of Consciousness: awake, alert , oriented and patient cooperative  Airway & Oxygen Therapy: Patient Spontanous Breathing and Patient connected to nasal cannula oxygen  Post-op Assessment: Report given to RN and Post -op Vital signs reviewed and stable  Post vital signs: Reviewed and stable  Last Vitals:  Vitals:   05/30/16 0613 05/30/16 0845  BP: 117/85 107/72  Pulse: 93 71  Resp: 16 (P) 11  Temp: 36.6 C (P) 36.4 C    Last Pain: There were no vitals filed for this visit.    Patients Stated Pain Goal: 3 ([id number])  Complications: No apparent anesthesia complications

## 2016-05-30 NOTE — H&P (Signed)
[name] is an 35 y.o. female G0 here for laparoscopy due to chronic pelvic pain that worsened after going off her OCPs about a year ago.  She has pain that starts with ovulation, improves slightly and then worsens again until her cycle begins.  She is contemplating pregnancy but has not been interested in doing anything aggressive.  She and I have discussed the possibility of GI origin of her pain.  She knows additional evaluation may be necessary if laparoscopy is negative today.  Risks, benefits, alternatives including going back on OCPs has been discussed.  Pt is here and ready to proceed.  Pertinent Gynecological History: Menses: regular flow with pain before cycle onset Bleeding: regular Contraception: none DES exposure: denies Blood transfusions: none Sexually transmitted diseases: no past history Previous GYN Procedures: none  Last mammogram: n/a Date: n/a Last pap: ASCUS with neg HR HPV Date: 03/23/16 OB History: G0, P0     Past Medical History:  Diagnosis Date  . Dental bridge present    lower front  . Dry eyes   . Headache    Migraines  . Irritable bowel syndrome (IBS)   . Seasonal allergies   . Trigger thumb of right hand 11/2013    Past Surgical History:  Procedure Laterality Date  . CHOLECYSTECTOMY  06/2015  . COLPOSCOPY  2008  . REFRACTIVE SURGERY Bilateral 02/2015  . TONSILLECTOMY  1988  . TRIGGER FINGER RELEASE Right 12/23/2013   Procedure: RIGHT THUMB TRIGGER RELEASE ;  Surgeon: Tennis Must, MD;  Location: [facility];  Service: Orthopedics;  Laterality: Right;    Family History  Problem Relation Age of Onset  . Breast cancer Maternal Grandmother 28    died early 61's  . Hyperlipidemia Mother   . Heart disease Father   . Cancer Paternal Grandfather     Social History:  reports that she quit smoking about 5 years ago. She has never used smokeless tobacco. She reports that she drinks about 2.0 - 3.0 oz of alcohol per week . She reports  that she does not use drugs.  Allergies: No Known Allergies  Prescriptions Prior to Admission  Medication Sig Dispense Refill Last Dose  . fluticasone (FLONASE) 50 MCG/ACT nasal spray Place 2 sprays into both nostrils daily.   05/29/2016 at Unknown time  . ibuprofen (ADVIL,MOTRIN) 800 MG tablet Take 1 tablet (800 mg total) by mouth every 8 (eight) hours as needed. 30 tablet 2 Past Month at Unknown time  . montelukast (SINGULAIR) 10 MG tablet Take 10 mg by mouth at bedtime.   05/29/2016 at Unknown time  . Multiple Vitamin (MULTIVITAMIN) tablet Take 1 tablet by mouth daily.   05/29/2016 at Unknown time  . Prenatal Vit-Fe Fumarate-FA (MULTIVITAMIN-PRENATAL) 27-0.8 MG TABS tablet Take 1 tablet by mouth daily at 12 noon.     . Simethicone (GAS-X PO) Take 1 tablet by mouth daily as needed.    05/29/2016 at Unknown time  . valACYclovir (VALTREX) 1000 MG tablet Take 1 tablet (1,000 mg total) by mouth daily. 90 tablet 3 05/29/2016 at Unknown time    Review of Systems  All other systems reviewed and are negative.   Blood pressure 117/85, pulse 93, temperature 97.8 F (36.6 C), resp. rate 16, last menstrual period 04/04/2016, SpO2 98 %. Physical Exam  Constitutional: She is oriented to person, place, and time. She appears well-developed and well-nourished.  Cardiovascular: Normal rate and regular rhythm.   Respiratory: Effort normal and breath sounds normal.  Neurological: She is alert and oriented to person, place, and time.  Skin: Skin is warm and dry.  Psychiatric: She has a normal mood and affect.    No results found for this or any previous visit (from the past 24 hour(s)).  No results found.  Assessment/Plan: 35 yo G0 MWF here for diagnostic laparoscopy with possible treatment of endometriosis as well as chromopertubation for tubal assessment due to pelvic pain.  All questions answered.  Pt here and ready to proceed.  Hale Bogus SUZANNE 05/30/2016, 6:39 AM

## 2016-05-30 NOTE — Anesthesia Procedure Notes (Signed)
Procedure Name: Intubation Date/Time: 05/30/2016 7:30 AM Performed by: Raenette Rover Pre-anesthesia Checklist: Patient identified, Emergency Drugs available, Suction available and Patient being monitored Patient Re-evaluated:Patient Re-evaluated prior to inductionOxygen Delivery Method: Circle system utilized Preoxygenation: Pre-oxygenation with 100% oxygen Intubation Type: IV induction Ventilation: Mask ventilation without difficulty Laryngoscope Size: Mac and 3 Grade View: Grade I Tube type: Oral Tube size: 7.0 mm Number of attempts: 1 Airway Equipment and Method: Stylet Placement Confirmation: ETT inserted through vocal cords under direct vision,  positive ETCO2,  CO2 detector and breath sounds checked- equal and bilateral Secured at: 21 cm Tube secured with: Tape Dental Injury: Teeth and Oropharynx as per pre-operative assessment

## 2016-05-30 NOTE — Op Note (Addendum)
05/30/2016  9:10 AM  PATIENT:  [name]  35 y.o. female  PRE-OPERATIVE DIAGNOSIS:  pelvic pain  POST-OPERATIVE DIAGNOSIS:  pelvic pain, possible endometriosis  PROCEDURE:  Procedure(s): LAPAROSCOPY OPERATIVE WITH LYSIS OF ADHESIONS, EXCISION OF POSSIBLE ENDOMETRIOSIS CHROMOPERTUBATION  SURGEON:  Shawnice Tilmon SUZANNE  ASSISTANTS: Dr. Josefa Half   ANESTHESIA:   general  ESTIMATED BLOOD LOSS: 10cc  BLOOD ADMINISTERED:none   FLUIDS: 1300cc LR  UOP: 40cc concentrated urine  SPECIMEN:  Adhesions from uterus to posterior pelvis and adhesions from uterus to right fallopian tube, right peritoneal sidewall excisional biopsy  DISPOSITION OF SPECIMEN:  PATHOLOGY  FINDINGS: area of probable endometriosis on right pelvic sidewall, thick adhesion from posterior cervix to posterior pelvis and adhesion from uterus to right fallopian tube, normal upper abdomen, s/p cholecystectomy, normal tubes with normal spill, normal uterus, normal appearing ureters.  DESCRIPTION OF OPERATION: Patient is taken to the operating room. She is placed in the supine position. She is a running IV in place. Informed consent was present on the chart. SCDs on her lower extremities and functioning properly. General endotracheal anesthesia was administered by the anesthesia staff without difficulty. Once adequate anesthesia was confirmed the legs are placed in the low lithotomy position in [place]. Her arms were tucked by the side.   Dura prep was then used to prep the abdomen and Betadine was used to prep the inner thighs, perineum and vagina. Once 3 minutes had past the patient was draped in a normal standard fashion. The legs were lifted to the high lithotomy position. The cervix was visualized by placing a heavy weighted speculum in the posterior aspect of the vagina and using a curved Deaver retractor to the retract anteriorly. The anterior lip of the cervix was grasped with single-tooth tenaculum.  An  acorn uterine manipulator was placed at the cervical os and connected to the tenaculum as a means of manipulating the uterus during the procedure.  Tubing was attached and stopcocks closed for the chromopertubation portion of the procedure.  A Foley catheter was placed to straight drain. The concentrated urine was noted. Legs were lowered to the low lithotomy position and attention was turned the abdomen.  The umbilicus was everted.  A Veress needle was obtained. Syringe of sterile saline was placed on a open Veress needle.  This was passed into the umbilicus until just when the fluid started to drip.  Then low flow CO2 gas was attached the needle and the pneumoperitoneum was achieved without difficulty. Once 2.0 liters of gas was in the abdomen the Veress needle was removed and a 5 millimeter non-bladed Optiview trocar and port were passed directly to the abdomen. The laparoscope was then used to confirm intraperitoneal placement. Findings were noted as above.  Locations for RLQ and LLQ ports were noted by transillumination of the abdominal wall.  0.25% marcaine was used to anesthetize the skin.  26mm skin incisions were made and then 16mm bladed ports were placed.  Finally a midline incision was made about 4 cm above the pubic symphysis as I felt I was going to need a third port for the excision of endometriosis due to location.  The skin was incised about 70mm and a non-bladed 5 port was placed with direct visualization of the laparoscope.    Ureters were identified.  The thickened structure from the posterior side of the uterus was followed all the way to the pelvis.  Ureter and appendix was noted.  It was neither of these.  It appeared to be a tethered and separated uterosacral ligament.  Using bipolar cautery, the ends were cauterized and the structure was cut and handed off for pathology.  Similarly, the adhesion to the fallopian tube was cauterized and excised.  The peritoneum around the area of  endometriosis was then elevated and excised with sharp dissection.  Excellent hemostasis was noted.  Lastly, methylene blue mixed with saline was injected through the acorn uterine manipulator and bilateral spill was noted.  At this point, the pelvis was irritated and all fluid removed.  Multiple photos were taken throughout the procedure. No bleeding was noted. The ports were removed under direct visualization of the laparoscope and the pneumoperitoneum was relieved.  The patient was taken out of Trendelenburg positioning.  Several deep breaths were given to the patient's trying to any gas the abdomen and finally the midline port was removed.  The skin was then closed with subcuticular stitches of 3-0 Vicryl. The skin was cleansed Dermabond was applied.  Vaginal instruments were removed.  No vaginal bleeding was noted.  Pt was given IV and IM Toradol. Foley was removed.  Sponge, lap, needle, initially counts were correct x2. Patient tolerated the procedure very well. She was awakened from anesthesia, extubated and taken to recovery in stable condition.   COUNTS:  YES  PLAN OF CARE: Transfer to PACU

## 2016-05-30 NOTE — Anesthesia Postprocedure Evaluation (Signed)
Anesthesia Post Note  Patient: [name]  Procedure(s) Performed: Procedure(s) (LRB): LAPAROSCOPY OPERATIVE WITH LYSIS OF ADHESIONS, EXCISION OF POSSIBLE ENDOMETRIOSIS (N/A) CHROMOPERTUBATION (N/A)  Patient location during evaluation: PACU Anesthesia Type: General Level of consciousness: awake and alert Pain management: pain level controlled Vital Signs Assessment: post-procedure vital signs reviewed and stable Respiratory status: spontaneous breathing, nonlabored ventilation and respiratory function stable Cardiovascular status: blood pressure returned to baseline and stable Postop Assessment: no signs of nausea or vomiting Anesthetic complications: no     Last Vitals:  Vitals:   05/30/16 0900 05/30/16 0915  BP: 102/72 100/71  Pulse: 67 81  Resp: 14 16  Temp:      Last Pain: There were no vitals filed for this visit. Pain Goal: Patients Stated Pain Goal: 3 (05/30/16 0845)               Nilda Simmer

## 2016-05-30 NOTE — Discharge Instructions (Signed)
Post-surgical Instructions, Outpatient Surgery  You may expect to feel dizzy, weak, and drowsy for as long as 24 hours after receiving the medicine that made you sleep (anesthetic). For the first 24 hours after your surgery:    Do not drive a car, ride a bicycle, participate in physical activities, or take public transportation until you are done taking narcotic pain medicines or as directed by Dr. Sabra Heck.   Do not drink alcohol or take tranquilizers.   Do not take medicine that has not been prescribed by your physicians.   Do not sign important papers or make important decisions while on narcotic pain medicines.   Have a responsible person with you.   Do remove the scopolamine patch in 48-72 hours.  Wash hands thoroughly after removal.  CARE OF INCISION  If you have a bandage, you may remove it in one day.  If there are steri-strips or dermabond, just let this loosen on its own.   You may shower on the first day after your surgery.  Do not sit in a tub bath for one week.  Avoid heavy lifting (more than 10 pounds/4.5 kilograms), pushing, or pulling.   Avoid activities that may risk injury to your incisions.   PAIN MANAGEMENT  Motrin 800mg .  (This is the same as 4-200mg  over the counter tablets of Motrin or ibuprofen.)  You may take this every eight hours or as needed for cramping.    Vicodin 5/325mg .  For more severe pain, take one or two tablets every four to six hours as needed for pain control.  (Remember that narcotic pain medications increase your risk of constipation.  If this becomes a problem, you may take an over the counter stool softener like Colace 100mg  up to four times a day.)  DO'S AND DON'T'S  Do not take a tub bath for one week.  You may shower on the first day after your surgery  Do not do any heavy lifting for one to two weeks.  This increases the chance of bleeding.  Do move around as you feel able.  Stairs are fine.  You may begin to exercise again as you  feel able.  Do not lift any weights for two weeks.  Do not put anything in the vagina for two weeks--no tampons, intercourse, or douching.    REGULAR MEDIATIONS/VITAMINS:  You may restart all of your regular medications as prescribed.  You may restart all of your vitamins as you normally take them.    PLEASE CALL OR SEEK MEDICAL CARE IF:  You have persistent nausea and vomiting.   You have trouble eating or drinking.   You have an oral temperature above 100.5.   You have constipation that is not helped by adjusting diet or increasing fluid intake. Pain medicines are a common cause of constipation.   You have heavy vaginal bleeding  You have redness or drainage from your incision(s) or there is increasing pain or tenderness near or in the surgical site.

## 2016-05-31 ENCOUNTER — Encounter (HOSPITAL_COMMUNITY): Payer: Self-pay | Admitting: Obstetrics & Gynecology

## 2016-05-31 NOTE — Anesthesia Postprocedure Evaluation (Signed)
Anesthesia Post Note  Patient: [name]  Procedure(s) Performed: Procedure(s) (LRB): LAPAROSCOPY OPERATIVE WITH LYSIS OF ADHESIONS, EXCISION OF POSSIBLE ENDOMETRIOSIS (N/A) CHROMOPERTUBATION (N/A)  Patient location during evaluation: PACU Anesthesia Type: General Level of consciousness: awake and alert Pain management: pain level controlled Vital Signs Assessment: post-procedure vital signs reviewed and stable Respiratory status: spontaneous breathing, nonlabored ventilation and respiratory function stable Cardiovascular status: blood pressure returned to baseline and stable Postop Assessment: no signs of nausea or vomiting Anesthetic complications: no     Last Vitals:  Vitals:   05/30/16 1000 05/30/16 1040  BP: 94/74 110/75  Pulse: 100 (!) 104  Resp: (!) 22 18  Temp: 36.7 C 36.3 C    Last Pain:  Vitals:   05/30/16 1040  PainSc: 1    Pain Goal: Patients Stated Pain Goal: 3 (05/30/16 0945)               Nilda Simmer

## 2016-06-01 ENCOUNTER — Telehealth: Payer: Self-pay | Admitting: Obstetrics & Gynecology

## 2016-06-01 NOTE — Telephone Encounter (Signed)
The pathology report showed the following: - fibroglandular tissue for the band that was removed.  - endometriosis for the right pelvic sidewall biopsy.  No atypical cells or cancer seen.   She had findings that can cause pain, and Dr. Sabra Heck treated all of these areas!  Dr. Sabra Heck will review the final reports upon her return and make any additional recommendations.   Cc- Dr. Sabra Heck

## 2016-06-01 NOTE — Telephone Encounter (Signed)
Patient had surgery 05/30/16.  She said she was told to expect a call from Dr. Sabra Heck on Monday evening, 05/30/16, but no one has called her yet.

## 2016-06-01 NOTE — Telephone Encounter (Signed)
Patient had a laparoscopy operative lysis of adhesions, excision of possible endometriosis on 05/30/2016 with Dr.Miller. Surgical pathology has returned. To Dr.Silva's desk for review and advise as Dr.Miller is out of the office today.  Cc: Dr.Miller

## 2016-06-01 NOTE — Telephone Encounter (Signed)
Called patient personally and discussed surgical findings and pathology results.  Pt report she feels well and has no complaints.  Will return for POV as scheduled.  OK to close encounter.

## 2016-06-07 ENCOUNTER — Ambulatory Visit (INDEPENDENT_AMBULATORY_CARE_PROVIDER_SITE_OTHER): Payer: BLUE CROSS/BLUE SHIELD | Admitting: Obstetrics & Gynecology

## 2016-06-07 ENCOUNTER — Encounter: Payer: Self-pay | Admitting: Obstetrics & Gynecology

## 2016-06-07 ENCOUNTER — Telehealth: Payer: Self-pay | Admitting: Obstetrics & Gynecology

## 2016-06-07 VITALS — BP 110/60 | HR 80 | Temp 98.2°F | Resp 16 | Ht 61.0 in | Wt 124.0 lb

## 2016-06-07 DIAGNOSIS — N898 Other specified noninflammatory disorders of vagina: Secondary | ICD-10-CM

## 2016-06-07 DIAGNOSIS — R1031 Right lower quadrant pain: Secondary | ICD-10-CM | POA: Diagnosis not present

## 2016-06-07 NOTE — Telephone Encounter (Signed)
Spoke with patient after speaking with Dr. Sabra Heck. Patient states she would like OV for today. Scheduled 06/07/16 at 4:15pm with Dr. Sabra Heck. Patient verbalizes understanding and is agreeable.   Routing to provider for final review. Patient is agreeable to disposition. Will close encounter.

## 2016-06-07 NOTE — Telephone Encounter (Signed)
Spoke with patient. Patient states she had Laparoscopic surgery for endometriosis on 05/30/16. Patient states initially she had incisional pain, but that resolved. Patient states she returned to work 10/16 and by mid-morning she started having RLQ pain. Patient states this has continued to today, rates pain 2-3/10 and describes as "uncomfortable".Patient states she is returning home from work today. Patient reports taking 800mg  with little relief. Last dose of Vicodin 10/13. No vaginal bleeding since 06/03/16. Denies fever. Last BM 10/17, passing flatus. Patient reports "waves of nausea" that is possibly related to discomfort. Advised Dr. Sabra Heck is in surgery this morning, will review with Dr. Sabra Heck for recommendations and return call. Patient is agreeable.    Dr. Sabra Heck, please advise?

## 2016-06-07 NOTE — Telephone Encounter (Signed)
Patient back at work after having surgery on the 9th and is having right-sided pelvic pain.

## 2016-06-07 NOTE — Progress Notes (Signed)
GYNECOLOGY  VISIT   HPI: 35 y.o. G29P0000 Married Caucasian female who is 12 days post op from laparoscopy with LOA and removal of endometriosis here with increased pelvic pain and complaint of vaginal discharge.  Denies vaginal odor.  Denied fevers.  Reports normal urinary and bowel function.  Was off all pain medication for several days.  Reports she worked yesterday and felt more sore.  Then it just worsened.  She is here to be seen for this today.  Denies any trauma or other causes of pain.  Patient's last menstrual period was 05/17/2016.    Pictures and pathology was reviewed with pt today.  GYNECOLOGIC HISTORY: Patient's last menstrual period was 05/17/2016.  Patient Active Problem List   Diagnosis Date Noted  . Pelvic pain in female 01/29/2016  . Spasm of bowel 01/29/2016  . IBS (irritable bowel syndrome) 01/29/2016    Past Medical History:  Diagnosis Date  . Dental bridge present    lower front  . Dry eyes   . Headache    Migraines  . Irritable bowel syndrome (IBS)   . Seasonal allergies   . Trigger thumb of right hand 11/2013    Past Surgical History:  Procedure Laterality Date  . CHOLECYSTECTOMY  06/2015  . CHROMOPERTUBATION N/A 05/30/2016   Procedure: CHROMOPERTUBATION;  Surgeon: Megan Salon, MD;  Location: [facility];  Service: Gynecology;  Laterality: N/A;  . COLPOSCOPY  2008  . LAPAROSCOPY N/A 05/30/2016   Procedure: LAPAROSCOPY OPERATIVE WITH LYSIS OF ADHESIONS, EXCISION OF POSSIBLE ENDOMETRIOSIS;  Surgeon: Megan Salon, MD;  Location: [facility];  Service: Gynecology;  Laterality: N/A;  . REFRACTIVE SURGERY Bilateral 02/2015  . TONSILLECTOMY  1988  . TRIGGER FINGER RELEASE Right 12/23/2013   Procedure: RIGHT THUMB TRIGGER RELEASE ;  Surgeon: Tennis Must, MD;  Location: [facility];  Service: Orthopedics;  Laterality: Right;    MEDS:  Reviewed in EPIC and UTD  ALLERGIES: Review of patient's allergies indicates no known allergies.  Family History   Problem Relation Age of Onset  . Breast cancer Maternal Grandmother 28    died early 45's  . Hyperlipidemia Mother   . Heart disease Father   . Cancer Paternal Grandfather    SH:  Married, non smoker  Review of Systems  All other systems reviewed and are negative.   PHYSICAL EXAMINATION:    BP 110/60 (BP Location: Right Arm, Patient Position: Sitting, Cuff Size: Normal)   Pulse 80   Temp 98.2 F (36.8 C) (Oral)   Resp 16   Ht 5\' 1"  (1.549 m)   Wt 124 lb (56.2 kg)   LMP 05/17/2016   BMI 23.43 kg/m     General appearance: alert, cooperative and appears stated age CV:  Regular rate and rhythm Lungs:  clear to auscultation, no wheezes, rales or rhonchi, symmetric air entry Abdomen: soft, mile RLQ tenderness to palpation; bowel sounds normal; no masses,  no organomegaly  Pelvic: External genitalia:  no lesions              Urethra:  normal appearing urethra with no masses, tenderness or lesions              Bartholins and Skenes: normal                 Vagina: normal appearing vagina with normal color and scant white discharge              Cervix: no lesions  Bimanual Exam:  Uterus:  normal size, contour, position, consistency, mobility, non-tender              Adnexa: no mass, fullness, tenderness              Anus:  normal sphincter tone, no lesions  Chaperone was present for exam.  Assessment: New onset RLQ pain 8 days post op from above procedure Vaginal discharge  Plan: Exam is normal and pt is reassured.  Of course, she knows to call if symptoms worsen. Affirm pending.  Results will be called to pt.

## 2016-06-08 LAB — WET PREP BY MOLECULAR PROBE
Candida species: NEGATIVE
Gardnerella vaginalis: NEGATIVE
TRICHOMONAS VAG: NEGATIVE

## 2016-06-09 ENCOUNTER — Encounter: Payer: Self-pay | Admitting: Obstetrics & Gynecology

## 2016-06-10 ENCOUNTER — Telehealth: Payer: Self-pay | Admitting: Obstetrics & Gynecology

## 2016-06-10 NOTE — Telephone Encounter (Signed)
Patient called and cancelled her post op appointment on 06/13/16 with Dr. Sabra Heck. She said, "I saw Dr. Sabra Heck on 06/07/16 and she told me if I was feeling fine, I don't need to keep this appointment. I am doing well and don't think I need to keep that appointment."  FYI only.

## 2016-06-13 ENCOUNTER — Ambulatory Visit: Payer: BLUE CROSS/BLUE SHIELD | Admitting: Obstetrics & Gynecology

## 2016-06-20 ENCOUNTER — Other Ambulatory Visit: Payer: Self-pay | Admitting: Nurse Practitioner

## 2016-06-21 NOTE — Telephone Encounter (Signed)
Medication refill request: Valtrex   Last AEX:  03-23-16  Next AEX: 03-24-17  Last MMG (if hormonal medication request): N/A Refill authorized: Please advise

## 2016-07-08 ENCOUNTER — Other Ambulatory Visit: Payer: Self-pay | Admitting: Nurse Practitioner

## 2016-07-08 ENCOUNTER — Telehealth: Payer: Self-pay | Admitting: Nurse Practitioner

## 2016-07-08 DIAGNOSIS — Z1231 Encounter for screening mammogram for malignant neoplasm of breast: Secondary | ICD-10-CM

## 2016-07-08 NOTE — Telephone Encounter (Signed)
Spoke with Mariam at the [facility]. Order for patient's screening mammogram has been placed and signed. Spoke with patient advised order has been reviewed with the [facility] and she is set for her appointment on 08/01/2016. Patient is agreeable and verbalizes understanding.  Routing to covering provider for final review. Patient agreeable to disposition. Will close encounter.

## 2016-07-08 NOTE — Telephone Encounter (Signed)
Patient is calling to check on an mammogram order that was supposed to be sent to [facility]. She has an appointment set for 08/01/16.

## 2016-07-22 ENCOUNTER — Institutional Professional Consult (permissible substitution): Payer: BLUE CROSS/BLUE SHIELD | Admitting: Obstetrics & Gynecology

## 2016-08-01 ENCOUNTER — Ambulatory Visit
Admission: RE | Admit: 2016-08-01 | Discharge: 2016-08-01 | Disposition: A | Discharge status: Home / Self Care | Payer: BLUE CROSS/BLUE SHIELD | Source: Ambulatory Visit | Attending: Nurse Practitioner | Admitting: Nurse Practitioner

## 2016-08-01 DIAGNOSIS — Z1231 Encounter for screening mammogram for malignant neoplasm of breast: Secondary | ICD-10-CM | POA: Diagnosis not present

## 2016-12-02 DIAGNOSIS — F4322 Adjustment disorder with anxiety: Secondary | ICD-10-CM | POA: Diagnosis not present

## 2016-12-12 DIAGNOSIS — F4322 Adjustment disorder with anxiety: Secondary | ICD-10-CM | POA: Diagnosis not present

## 2016-12-26 DIAGNOSIS — H1045 Other chronic allergic conjunctivitis: Secondary | ICD-10-CM | POA: Diagnosis not present

## 2016-12-26 DIAGNOSIS — J3081 Allergic rhinitis due to animal (cat) (dog) hair and dander: Secondary | ICD-10-CM | POA: Diagnosis not present

## 2016-12-26 DIAGNOSIS — F4322 Adjustment disorder with anxiety: Secondary | ICD-10-CM | POA: Diagnosis not present

## 2016-12-26 DIAGNOSIS — J301 Allergic rhinitis due to pollen: Secondary | ICD-10-CM | POA: Diagnosis not present

## 2016-12-26 DIAGNOSIS — J3089 Other allergic rhinitis: Secondary | ICD-10-CM | POA: Diagnosis not present

## 2017-03-24 ENCOUNTER — Ambulatory Visit: Payer: BLUE CROSS/BLUE SHIELD | Admitting: Nurse Practitioner

## 2017-03-24 ENCOUNTER — Other Ambulatory Visit (HOSPITAL_COMMUNITY)
Admission: RE | Admit: 2017-03-24 | Discharge: 2017-03-24 | Disposition: A | Discharge status: Home / Self Care | Payer: BLUE CROSS/BLUE SHIELD | Source: Ambulatory Visit | Attending: Certified Nurse Midwife | Admitting: Certified Nurse Midwife

## 2017-03-24 ENCOUNTER — Ambulatory Visit (INDEPENDENT_AMBULATORY_CARE_PROVIDER_SITE_OTHER): Payer: BLUE CROSS/BLUE SHIELD | Admitting: Certified Nurse Midwife

## 2017-03-24 ENCOUNTER — Encounter: Payer: Self-pay | Admitting: Certified Nurse Midwife

## 2017-03-24 VITALS — BP 108/64 | HR 60 | Resp 16 | Ht 61.25 in | Wt 123.0 lb

## 2017-03-24 DIAGNOSIS — Z0000 Encounter for general adult medical examination without abnormal findings: Secondary | ICD-10-CM

## 2017-03-24 DIAGNOSIS — Z124 Encounter for screening for malignant neoplasm of cervix: Secondary | ICD-10-CM | POA: Diagnosis not present

## 2017-03-24 DIAGNOSIS — Z01419 Encounter for gynecological examination (general) (routine) without abnormal findings: Secondary | ICD-10-CM | POA: Diagnosis not present

## 2017-03-24 DIAGNOSIS — N946 Dysmenorrhea, unspecified: Secondary | ICD-10-CM

## 2017-03-24 MED ORDER — IBUPROFEN 800 MG PO TABS: 800.0000 mg | ORAL_TABLET | Freq: Three times a day (TID) | ORAL | 4 refills | Status: DC | PRN

## 2017-03-24 NOTE — Patient Instructions (Signed)

## 2017-03-24 NOTE — Progress Notes (Signed)
36 y.o. G0P0000 Married  Caucasian Fe here for annual exam. Periods normal, regular, with cramping for 3 days with period. Taking Advil every 6-8 hours as needed. History of endometriosis, and now off OCP. May choose to restart in future. Not trying for pregnancy, Ok if occurs. Would like screening labs if needed. Has noted occasional discharge from pierced nipple area once or twice, none on SBE. No other health issues today.  Patient's last menstrual period was 03/01/2017.          Sexually active: Yes.    The current method of family planning is none.    Exercising: Yes.    walking & kickboxing Smoker:  no  Health Maintenance: Pap:  03-20-14 neg HPV HR neg, 03-23-16 ASCUS HPV HR neg History of Abnormal Pap: yes MMG:  08-01-16 category c density birads 1:neg Self Breast exams: yes Colonoscopy:  none BMD:   none TDaP:  2014 Shingles: no Pneumonia: no Hep C and HIV: HIV neg 2014 Labs: none   reports that she quit smoking about 6 years ago. She has never used smokeless tobacco. She reports that she drinks about 1.8 - 2.4 oz of alcohol per week . She reports that she does not use drugs.  Past Medical History:  Diagnosis Date  . Dental bridge present    lower front  . Dry eyes   . Endometriosis   . Headache    Migraines  . Irritable bowel syndrome (IBS)   . Seasonal allergies   . Trigger thumb of right hand 11/2013    Past Surgical History:  Procedure Laterality Date  . CHOLECYSTECTOMY  06/2015  . CHROMOPERTUBATION N/A 05/30/2016   Procedure: CHROMOPERTUBATION;  Surgeon: Megan Salon, MD;  Location: [facility];  Service: Gynecology;  Laterality: N/A;  . COLPOSCOPY  2008  . LAPAROSCOPY N/A 05/30/2016   Procedure: LAPAROSCOPY OPERATIVE WITH LYSIS OF ADHESIONS, EXCISION OF POSSIBLE ENDOMETRIOSIS;  Surgeon: Megan Salon, MD;  Location: [facility];  Service: Gynecology;  Laterality: N/A;  . REFRACTIVE SURGERY Bilateral 02/2015  . TONSILLECTOMY  1988  . TRIGGER FINGER RELEASE Right 12/23/2013    Procedure: RIGHT THUMB TRIGGER RELEASE ;  Surgeon: Tennis Must, MD;  Location: [facility];  Service: Orthopedics;  Laterality: Right;    Current Outpatient Prescriptions  Medication Sig Dispense Refill  . Cholecalciferol (VITAMIN D PO) Take by mouth. occ    . fluticasone (FLONASE) 50 MCG/ACT nasal spray Place 2 sprays into both nostrils daily.    Marland Kitchen ibuprofen (ADVIL,MOTRIN) 800 MG tablet Take 1 tablet (800 mg total) by mouth every 8 (eight) hours as needed for mild pain, moderate pain or cramping. 30 tablet 0  . montelukast (SINGULAIR) 10 MG tablet Take 10 mg by mouth at bedtime.    . Multiple Vitamin (MULTIVITAMIN) tablet Take 1 tablet by mouth daily.    . Simethicone (GAS-X PO) Take 1 tablet by mouth daily as needed.     . valACYclovir (VALTREX) 1000 MG tablet TAKE 1 TABLET (1,000 MG TOTAL) BY MOUTH DAILY. 90 tablet 2   No current facility-administered medications for this visit.     Family History  Problem Relation Age of Onset  . Breast cancer Maternal Grandmother 28       died early 43's  . Hyperlipidemia Mother   . Heart disease Father   . Cancer Paternal Grandfather     ROS:  Pertinent items are noted in HPI.  Otherwise, a comprehensive ROS was negative.  Exam:  BP 108/64   Pulse 60   Resp 16   Ht 5' 1.25" (1.556 m)   Wt 123 lb (55.8 kg)   LMP 03/01/2017   BMI 23.05 kg/m  Height: 5' 1.25" (155.6 cm) Ht Readings from Last 3 Encounters:  03/24/17 5' 1.25" (1.556 m)  06/07/16 5\' 1"  (1.549 m)  05/17/16 5\' 1"  (1.549 m)    General appearance: alert, cooperative and appears stated age Head: Normocephalic, without obvious abnormality, atraumatic Neck: no adenopathy, supple, symmetrical, trachea midline and thyroid normal to inspection and palpation Lungs: clear to auscultation bilaterally Breasts: normal appearance, no masses or tenderness, No nipple retraction or dimpling, No nipple discharge or bleeding, No axillary or supraclavicular adenopathy, right  breast healed nipple piercing, but still appears slighly open Heart: regular rate and rhythm Abdomen: soft, non-tender; no masses,  no organomegaly Extremities: extremities normal, atraumatic, no cyanosis or edema Skin: Skin color, texture, turgor normal. No rashes or lesions Lymph nodes: Cervical, supraclavicular, and axillary nodes normal. No abnormal inguinal nodes palpated Neurologic: Grossly normal   Pelvic: External genitalia:  no lesions              Urethra:  normal appearing urethra with no masses, tenderness or lesions              Bartholin's and Skene's: normal                 Vagina: normal appearing vagina with normal color and discharge, no lesions              Cervix: no cervical motion tenderness and no lesions              Pap taken: Yes.   Bimanual Exam:  Uterus:  normal size, contour, position, consistency, mobility, non-tender              Adnexa: normal adnexa and no mass, fullness, tenderness               Rectovaginal: Confirms               Anus:  normal   Chaperone present: yes  A:  Well Woman with normal exam  Contraception none desired, pregnancy would be welcome  History of endometriosis  Follow up of ASCUS pap with negative HPVHR  Vaginal screening desires   Family history of breast cancer MGM at 52  Patient had first mammogram this year.  Screening labs  P:   Reviewed health and wellness pertinent to exam  Discussed B complex for occasional bloating with menses  May choose to return on contraception for pain control, but feels better off OCP. Consider IUD?  Continue routine mammogram yearly with 3 D, consider genetic consultation or screening  Labs; Yeast, BV, Trichomonas, HIV, Hep C  Pap smear: yes   counseled on breast self exam, family planning choices, adequate intake of calcium and vitamin D, diet and exercise  return annually or prn  An After Visit Summary was printed and given to the patient.

## 2017-03-25 LAB — PROLACTIN: PROLACTIN: 17.7 ng/mL (ref 4.8–23.3)

## 2017-03-25 LAB — HEPATITIS C ANTIBODY

## 2017-03-25 LAB — HIV ANTIBODY (ROUTINE TESTING W REFLEX): HIV SCREEN 4TH GENERATION: NONREACTIVE

## 2017-04-03 LAB — CYTOLOGY - PAP
BACTERIAL VAGINITIS: NEGATIVE
CANDIDA VAGINITIS: NEGATIVE
Diagnosis: NEGATIVE
HPV: NOT DETECTED
Trichomonas: NEGATIVE

## 2017-04-14 ENCOUNTER — Other Ambulatory Visit: Payer: Self-pay | Admitting: Obstetrics & Gynecology

## 2017-04-14 NOTE — Telephone Encounter (Signed)
Medication refill request: valtrex  Last AEX:  03/24/17 DL Next AEX: 03/28/18 DL Last MMG (if hormonal medication request): 08/01/16 [id number]:neg  Refill authorized: 06/21/16 #[id number]. Today please advise.

## 2017-05-01 ENCOUNTER — Ambulatory Visit (INDEPENDENT_AMBULATORY_CARE_PROVIDER_SITE_OTHER): Payer: BLUE CROSS/BLUE SHIELD | Admitting: Obstetrics & Gynecology

## 2017-05-01 ENCOUNTER — Encounter: Payer: Self-pay | Admitting: Obstetrics & Gynecology

## 2017-05-01 ENCOUNTER — Telehealth: Payer: Self-pay | Admitting: Certified Nurse Midwife

## 2017-05-01 VITALS — BP 112/74 | HR 70 | Resp 14 | Wt 127.0 lb

## 2017-05-01 DIAGNOSIS — N809 Endometriosis, unspecified: Secondary | ICD-10-CM | POA: Diagnosis not present

## 2017-05-01 DIAGNOSIS — N946 Dysmenorrhea, unspecified: Secondary | ICD-10-CM | POA: Diagnosis not present

## 2017-05-01 MED ORDER — ELAGOLIX SODIUM 150 MG PO TABS: 150.0000 mg | ORAL_TABLET | Freq: Every day | ORAL | 2 refills | Status: DC

## 2017-05-01 NOTE — Progress Notes (Signed)
GYNECOLOGY  VISIT   HPI: 36 y.o. G28P0000 Married Caucasian female here for discussion of worsening pain before and during menstrual cycles.  She underwent laparoscopy 10/17 with biopsy proven endometriosis is present. She feels her symptoms are worsening and she wants to consider additional treatment options.  OCPs (which she has used in the past and stopped due to side effects on Yaz), Depo Lupron, Orlissa, and hysterectomy discussed.  Pt and spouse have discussed hysterectomy as she is "pretty sure" she does not want to have child bearing.  Not sure she is completely ready to make this decision.  Depo Lupron and Orlissa with side effects and potential benefits reviewed with pt.  As Edward Qualia can be stopped very quickly, feel this may be the better option for her.  She is interested in seeing what coverage is present.  She is not sure if she wants to go back on OCPs.  If she did, I would recommend after a month or two, transitioning to continuous active OCPs to see if this helps with symptoms more than taking placebo pills.  She is going to consider options.  All questions answered.    GYNECOLOGIC HISTORY: Patient's last menstrual period was 04/30/2017. Contraception: none  Patient Active Problem List   Diagnosis Date Noted  . Pelvic pain in female 01/29/2016  . Spasm of bowel 01/29/2016  . IBS (irritable bowel syndrome) 01/29/2016    Past Medical History:  Diagnosis Date  . Abnormal Pap smear of cervix    2008 or 2009  . Dental bridge present    lower front  . Dry eyes   . Endometriosis   . Headache    Migraines  . Irritable bowel syndrome (IBS)   . Seasonal allergies   . Trigger thumb of right hand 11/2013    Past Surgical History:  Procedure Laterality Date  . CHOLECYSTECTOMY  06/2015  . CHROMOPERTUBATION N/A 05/30/2016   Procedure: CHROMOPERTUBATION;  Surgeon: Megan Salon, MD;  Location: [facility];  Service: Gynecology;  Laterality: N/A;  . COLPOSCOPY  2008  . LAPAROSCOPY N/A  05/30/2016   Procedure: LAPAROSCOPY OPERATIVE WITH LYSIS OF ADHESIONS, EXCISION OF POSSIBLE ENDOMETRIOSIS;  Surgeon: Megan Salon, MD;  Location: [facility];  Service: Gynecology;  Laterality: N/A;  . REFRACTIVE SURGERY Bilateral 02/2015  . TONSILLECTOMY  1988  . TRIGGER FINGER RELEASE Right 12/23/2013   Procedure: RIGHT THUMB TRIGGER RELEASE ;  Surgeon: Tennis Must, MD;  Location: [facility];  Service: Orthopedics;  Laterality: Right;    MEDS:   Current Outpatient Prescriptions on File Prior to Visit  Medication Sig Dispense Refill  . Cholecalciferol (VITAMIN D PO) Take by mouth. occ    . fluticasone (FLONASE) 50 MCG/ACT nasal spray Place 2 sprays into both nostrils daily.    Marland Kitchen ibuprofen (ADVIL,MOTRIN) 800 MG tablet Take 1 tablet (800 mg total) by mouth every 8 (eight) hours as needed for mild pain, moderate pain or cramping. 30 tablet 4  . montelukast (SINGULAIR) 10 MG tablet Take 10 mg by mouth at bedtime.    . Multiple Vitamin (MULTIVITAMIN) tablet Take 1 tablet by mouth daily.    . Simethicone (GAS-X PO) Take 1 tablet by mouth daily as needed.     . valACYclovir (VALTREX) 1000 MG tablet TAKE 1 TABLET (1,000 MG TOTAL) BY MOUTH DAILY. 90 tablet 0   No current facility-administered medications on file prior to visit.     ALLERGIES: Patient has no known allergies.  Family History  Problem Relation Age of Onset  . Breast cancer Maternal Grandmother 28       died early 75's  . Hyperlipidemia Mother   . Heart disease Father   . Cancer Paternal Grandfather     SH:  Married, non smoker  Review of Systems  Constitutional: Negative.   Respiratory: Negative.   Cardiovascular: Negative.   Gastrointestinal: Positive for abdominal pain.  Genitourinary: Negative.   Psychiatric/Behavioral: Negative.     PHYSICAL EXAMINATION:    BP 112/74 (BP Location: Right Arm, Patient Position: Sitting, Cuff Size: Normal)   Pulse 70   Resp 14   Wt 127 lb (57.6 kg)   LMP 04/30/2017    BMI 23.80 kg/m     General appearance: alert, cooperative and appears stated age No physical exam performed  Assessment: Endometriosis, biopsy proven  Plan: Will consider options and will call back with decision for treatment.   ~25 minutes spent with patient >50% of time was in face to face discussion of above.

## 2017-05-01 NOTE — Telephone Encounter (Signed)
Left message to call Tyland Klemens at [phone]. 

## 2017-05-01 NOTE — Patient Instructions (Addendum)
Orilissa

## 2017-05-01 NOTE — Telephone Encounter (Signed)
Spoke with patient. Patient states that she has endometriosis and is having severe pain with menses. Reports having a laparoscopy in 05/2016 which helped relieve pain some when she is not on her cycle. States she is on her menses now and is having severe pain. "I am miserable." Is taking 800 mg of ibuprofen every 8 hours without relief. Patient is having a hard time functioning at work. Patient is not on any form of birth control. Advised will need to be seen for further evaluation. Patient is agreeable. Appointment scheduled for 05/01/2017 at 3:30 pm with Dr.Miller. Patient is agreeable to date and time.  Routing to provider for final review. Patient agreeable to disposition. Will close encounter.

## 2017-05-01 NOTE — Telephone Encounter (Signed)
Patient states she has endometriosis and the pain is getting almost unbearable.  She would like to know what she can do to help with the cycle pain.

## 2017-05-02 ENCOUNTER — Telehealth: Payer: Self-pay | Admitting: *Deleted

## 2017-05-02 NOTE — Telephone Encounter (Signed)
Spoke with [name] who will come by the office today to drop off a form to be sent in for review of medication coverage for [facility] for the patient.

## 2017-05-02 NOTE — Telephone Encounter (Signed)
Form received from [name] for review of coverage. Completed form faxed and confirmed to [employer] at [phone].

## 2017-05-02 NOTE — Telephone Encounter (Signed)
Incoming fax from [facility] requesting alternative for [place].  PA needed.   Routed to [facility]

## 2017-05-02 NOTE — Telephone Encounter (Signed)
PA for Freida Busman has been denied. Per plan Lupron Depot must be tried and failed before Freida Busman can be covered.  Left message for [name] sales representative for Freida Busman to see what are options are to get this medication for the patient or if she has to try and fail Lupron Depot first.

## 2017-05-02 NOTE — Telephone Encounter (Signed)
PA submitted via Cover My Meds on 05/02/2017. Will await reply.

## 2017-05-03 ENCOUNTER — Other Ambulatory Visit: Payer: Self-pay | Admitting: Obstetrics & Gynecology

## 2017-05-03 ENCOUNTER — Encounter: Payer: Self-pay | Admitting: Obstetrics & Gynecology

## 2017-05-03 MED ORDER — NORETHIN ACE-ETH ESTRAD-FE 1-20 MG-MCG PO TABS: 1.0000 | ORAL_TABLET | Freq: Every day | ORAL | 3 refills | Status: DC

## 2017-05-03 NOTE — Telephone Encounter (Signed)
Spoke with Vaughan Basta. Calling to confirm receipt of coverage review form for [name]. Was advised can take 24-48 hours before faxed information is available for review in their system. Unable to confirm at this time.

## 2017-05-04 ENCOUNTER — Telehealth: Payer: Self-pay | Admitting: *Deleted

## 2017-05-04 NOTE — Telephone Encounter (Signed)
Pt has decided to start OCPs.  Ok to finish precert but she felt she needed something else right now.

## 2017-05-04 NOTE — Telephone Encounter (Addendum)
See also previous closed telephone encounter dated 05/02/17.  Spoke with Dominque at [facility] for Me. Was advised form received for review of benefit coverage for Orilissa. Has been assigned for review. Will notify office by fax and phone of coverage or of any additional recommendations or f/u such as PA, med exception, appeal. Office fax and phone numbers verified.   Routing to provider FYI.   Cc: Lamont Snowball, RN

## 2017-05-04 NOTE — Telephone Encounter (Signed)
See telephone encounter dated 05/04/17.

## 2017-05-17 ENCOUNTER — Inpatient Hospital Stay (HOSPITAL_COMMUNITY)
Admission: AD | Admit: 2017-05-17 | Discharge: 2017-05-18 | Discharge status: Against Medical Advice | Payer: BLUE CROSS/BLUE SHIELD | Source: Ambulatory Visit | Attending: Obstetrics and Gynecology | Admitting: Obstetrics and Gynecology

## 2017-05-17 ENCOUNTER — Encounter: Payer: Self-pay | Admitting: Obstetrics & Gynecology

## 2017-05-17 DIAGNOSIS — Z5321 Procedure and treatment not carried out due to patient leaving prior to being seen by health care provider: Secondary | ICD-10-CM | POA: Insufficient documentation

## 2017-05-17 DIAGNOSIS — R102 Pelvic and perineal pain: Secondary | ICD-10-CM | POA: Diagnosis not present

## 2017-05-17 NOTE — MAU Note (Signed)
Patient presents to MAU with c/o pelvic pain and pressure. Patient states she was in the bathroom when this occurred around 2200. Patient is currently on birth control- started two weeks ago. Denies VB or discharge.

## 2017-05-18 ENCOUNTER — Encounter: Payer: Self-pay | Admitting: Obstetrics & Gynecology

## 2017-05-18 ENCOUNTER — Encounter: Payer: Self-pay | Admitting: Obstetrics and Gynecology

## 2017-05-18 ENCOUNTER — Telehealth: Payer: Self-pay | Admitting: Obstetrics & Gynecology

## 2017-05-18 ENCOUNTER — Other Ambulatory Visit (INDEPENDENT_AMBULATORY_CARE_PROVIDER_SITE_OTHER): Payer: BLUE CROSS/BLUE SHIELD

## 2017-05-18 ENCOUNTER — Ambulatory Visit (INDEPENDENT_AMBULATORY_CARE_PROVIDER_SITE_OTHER): Payer: BLUE CROSS/BLUE SHIELD | Admitting: Obstetrics and Gynecology

## 2017-05-18 VITALS — BP 100/70 | HR 80 | Ht 61.25 in | Wt 125.0 lb

## 2017-05-18 DIAGNOSIS — N83201 Unspecified ovarian cyst, right side: Secondary | ICD-10-CM | POA: Diagnosis not present

## 2017-05-18 DIAGNOSIS — R102 Pelvic and perineal pain: Secondary | ICD-10-CM

## 2017-05-18 LAB — URINALYSIS, ROUTINE W REFLEX MICROSCOPIC
BILIRUBIN URINE: NEGATIVE
Glucose, UA: NEGATIVE mg/dL
HGB URINE DIPSTICK: NEGATIVE
KETONES UR: NEGATIVE mg/dL
Leukocytes, UA: NEGATIVE
NITRITE: NEGATIVE
PH: 6 (ref 5.0–8.0)
Protein, ur: NEGATIVE mg/dL
Specific Gravity, Urine: 1.008 (ref 1.005–1.030)

## 2017-05-18 LAB — POCT URINALYSIS DIPSTICK
Bilirubin, UA: NEGATIVE
GLUCOSE UA: NEGATIVE
KETONES UA: NEGATIVE
Leukocytes, UA: NEGATIVE
Nitrite, UA: NEGATIVE
Protein, UA: NEGATIVE
RBC UA: NEGATIVE
UROBILINOGEN UA: 0.2 U/dL
pH, UA: 5 (ref 5.0–8.0)

## 2017-05-18 LAB — POCT URINE PREGNANCY: PREG TEST UR: NEGATIVE

## 2017-05-18 NOTE — Telephone Encounter (Signed)
Spoke with patient. Reports pelvic pain started 9/21 with cramping and discomfort, continued through the weekend. Stabbing pain in pelvis, woke her from her sleep Monday night, felt like she could not empty bladder. States pain is mostly right sided. Experienced same stabbing pain along with pressure last night, went [facility], left before being seen. Took 1 hydrocodone at 1:30am reports pain is now 2/10. Reports increased urinary frequency, no burning, bleeding, N/V, fever or lower back pain.   See also MyChart message dated 9/26 and 9/27.  Patient aware Dr. Sabra Heck is out of the office. Patient scheduled for PUS at 10am today and consult to follow at 10:30am with Dr. Quincy Simmonds. Patient verbalizes understanding and is agreeable.   Routing to covering provider for final review. Patient is agreeable to disposition. Will close encounter.  Cc: Dr. Sabra Heck; Lerry Liner

## 2017-05-18 NOTE — Progress Notes (Signed)
Patient ID: [name], female   DOB: Oct 02, 1980, 36 y.o.   MRN: [id number] GYNECOLOGY  VISIT   HPI: 36 y.o.   Married  Caucasian  female   G0P0000 with Patient's last menstrual period was 04/30/2017 (exact date).   here for pelvic ultrasound for pelvic pain.  Patient states pelvic pain began 2 days ago and is now more RLQ pain. Denies any nausea, vomiting, temperature, decreased appetite, or change in bowel habits. Does have IBS.  Has bx proven endometriosis by laparoscopy one year ago.  Menses started to become painful again.  Saw Dr. Sabra Heck on 05/01/17 and started on oral contraceptives and is considering hysterectomy.   4 days ago had a lot of pain while at work, in the [place].  2 days ago, woke up with stabbing pain and urinary urgency and incomplete voiding. Then had a  feeling sore and tender. Last night stabbing pain and pressure returned, "like having a baby." [facility] but the wait was long, so she went home.  Took hydrocodone left over from surgery which helped to treat pain and help her sleep.  Usually pain controlled with Motrin.   Pain level today is a 2/10 and is more right sided.   ROS is negative other than as noted above.  Urine Dip: Neg UPT: Negative Temp: 98.4  GYNECOLOGIC HISTORY: Patient's last menstrual period was 04/30/2017 (exact date). Contraception: OCPs--Junel Fe 1/20(began 2 weeks ago) Menopausal hormone therapy:  n/a Last mammogram: n/a Last pap smear: 03-24-17 Neg:Neg HR HPV, 03-23-16 ASCUS;Neg HR HPV        OB History    Gravida Para Term Preterm AB Living   0 0 0 0 0 0   SAB TAB Ectopic Multiple Live Births   0 0 0 0 0         Patient Active Problem List   Diagnosis Date Noted  . Endometriosis 05/01/2017  . Pelvic pain in female 01/29/2016  . Spasm of bowel 01/29/2016  . IBS (irritable bowel syndrome) 01/29/2016    Past Medical History:  Diagnosis Date  . Abnormal Pap smear of cervix    2008 or 2009  . Dental  bridge present    lower front  . Dry eyes   . Endometriosis   . Headache    Migraines  . Irritable bowel syndrome (IBS)   . Seasonal allergies   . Trigger thumb of right hand 11/2013    Past Surgical History:  Procedure Laterality Date  . CHOLECYSTECTOMY  06/2015  . CHROMOPERTUBATION N/A 05/30/2016   Procedure: CHROMOPERTUBATION;  Surgeon: Megan Salon, MD;  Location: [facility];  Service: Gynecology;  Laterality: N/A;  . COLPOSCOPY  2008  . LAPAROSCOPY N/A 05/30/2016   Procedure: LAPAROSCOPY OPERATIVE WITH LYSIS OF ADHESIONS, EXCISION OF POSSIBLE ENDOMETRIOSIS;  Surgeon: Megan Salon, MD;  Location: [facility];  Service: Gynecology;  Laterality: N/A;  . REFRACTIVE SURGERY Bilateral 02/2015  . TONSILLECTOMY  1988  . TRIGGER FINGER RELEASE Right 12/23/2013   Procedure: RIGHT THUMB TRIGGER RELEASE ;  Surgeon: Tennis Must, MD;  Location: [facility];  Service: Orthopedics;  Laterality: Right;    Current Outpatient Prescriptions  Medication Sig Dispense Refill  . Cholecalciferol (VITAMIN D PO) Take by mouth. occ    . Elagolix Sodium (ORILISSA) 150 MG TABS Take 150 mg by mouth daily. 30 tablet 2  . fluticasone (FLONASE) 50 MCG/ACT nasal spray Place 2 sprays into both nostrils daily.    Marland Kitchen  ibuprofen (ADVIL,MOTRIN) 800 MG tablet Take 1 tablet (800 mg total) by mouth every 8 (eight) hours as needed for mild pain, moderate pain or cramping. 30 tablet 4  . montelukast (SINGULAIR) 10 MG tablet Take 10 mg by mouth at bedtime.    . Multiple Vitamin (MULTIVITAMIN) tablet Take 1 tablet by mouth daily.    . norethindrone-ethinyl estradiol (JUNEL FE,GILDESS FE,LOESTRIN FE) 1-20 MG-MCG tablet Take 1 tablet by mouth daily. 1 Package 3  . Simethicone (GAS-X PO) Take 1 tablet by mouth daily as needed.     . SUMAtriptan Succinate (IMITREX PO) Take by mouth as needed.    . valACYclovir (VALTREX) 1000 MG tablet TAKE 1 TABLET (1,000 MG TOTAL) BY MOUTH DAILY. 90 tablet 0   No current  facility-administered medications for this visit.      ALLERGIES: Patient has no known allergies.  Family History  Problem Relation Age of Onset  . Breast cancer Maternal Grandmother 28       died early 27's  . Hyperlipidemia Mother   . Heart disease Father   . Cancer Paternal Grandfather     Social History   Social History  . Marital status: Married    Spouse name: N/A  . Number of children: N/A  . Years of education: N/A   Occupational History  . Not on file.   Social History Main Topics  . Smoking status: Former Smoker    Quit date: 08/22/2010  . Smokeless tobacco: Never Used  . Alcohol use 1.8 - 2.4 oz/week    3 - 4 Standard drinks or equivalent per week  . Drug use: No  . Sexual activity: Yes    Partners: Male    Birth control/ protection: Pill     Comment: Junel Fe 1/20   Other Topics Concern  . Not on file   Social History Narrative  . No narrative on file    ROS:  Pertinent items are noted in HPI.  PHYSICAL EXAMINATION:    BP 100/70 (BP Location: Right Arm, Patient Position: Sitting, Cuff Size: Normal)   Pulse 80   Ht 5' 1.25" (1.556 m)   Wt 125 lb (56.7 kg)   LMP 04/30/2017 (Exact Date)   BMI 23.43 kg/m     General appearance: alert, cooperative and appears stated age   Abdomen: Soft, mildly tender in right lower quadrant with no guarding or rebound.  No masses,  no organomegaly   Pelvic: External genitalia:  no lesions              Urethra:  normal appearing urethra with no masses, tenderness or lesions              Bartholins and Skenes: normal                 Vagina: normal appearing vagina with normal color and discharge, no lesions              Cervix: no lesions.  No CMT.           Bimanual Exam:  Uterus:  normal size, contour, position, consistency, mobility, non-tender              Adnexa: normal adnexa and no mass, fullness, tenderness             Chaperone was present for exam.  Pelvic US: Uterus no myometrial masses.  EMS 3.52  mm.  Right  ovary 20 x 25 mm hemorrhagic CL cyst.  Left ovary normal. Mild  clear fluid in cul de sac.  ASSESSMENT  RLQ pain.  No acute abdomen.  Right ovarian cyst. Hemorrhagic CL. Hx endometriosis.  On combined OCPs.  PLAN  Discussion of hemorrhagia ovarian CL cyst.  I recommend continuing with OCPs.  Take Motrin 800 mg po q 8 hours prn.  Follow up with Dr. Sabra Heck in the next week.  Call for worsening pain.   An After Visit Summary was printed and given to the patient.  ___15___ minutes face to face time of which over 50% was spent in counseling.

## 2017-05-18 NOTE — Telephone Encounter (Signed)
Patient is having pain that is stabbing and pressure. She went to the ER yesterday but the wait was too long so she left. Patient sent [facility] message MSM. Patient wants to come in today.

## 2017-05-18 NOTE — Telephone Encounter (Signed)
See telephone encounter dated 05/18/17.

## 2017-05-18 NOTE — MAU Note (Signed)
Patient left AMA @0102 - papers signed

## 2017-05-23 ENCOUNTER — Ambulatory Visit (INDEPENDENT_AMBULATORY_CARE_PROVIDER_SITE_OTHER): Payer: BLUE CROSS/BLUE SHIELD | Admitting: Obstetrics & Gynecology

## 2017-05-23 VITALS — BP 102/66 | HR 72 | Resp 16 | Ht 61.25 in | Wt 126.0 lb

## 2017-05-23 DIAGNOSIS — N809 Endometriosis, unspecified: Secondary | ICD-10-CM | POA: Diagnosis not present

## 2017-05-23 DIAGNOSIS — N83201 Unspecified ovarian cyst, right side: Secondary | ICD-10-CM | POA: Diagnosis not present

## 2017-05-23 DIAGNOSIS — R102 Pelvic and perineal pain: Secondary | ICD-10-CM | POA: Diagnosis not present

## 2017-05-23 MED ORDER — NORETHINDRONE ACET-ETHINYL EST 1.5-30 MG-MCG PO TABS: ORAL_TABLET | ORAL | 4 refills | Status: DC

## 2017-05-23 MED ORDER — TRAMADOL HCL 50 MG PO TABS: ORAL_TABLET | ORAL | 0 refills | Status: DC

## 2017-05-23 MED ORDER — NORETHINDRONE ACET-ETHINYL EST 1.5-30 MG-MCG PO TABS: 1.0000 | ORAL_TABLET | Freq: Every day | ORAL | 5 refills | Status: DC

## 2017-05-23 MED ORDER — CYCLOBENZAPRINE HCL 5 MG PO TABS: 5.0000 mg | ORAL_TABLET | Freq: Three times a day (TID) | ORAL | 0 refills | Status: DC | PRN

## 2017-05-23 NOTE — Progress Notes (Signed)
GYNECOLOGY  VISIT   CC:   RLQ pain  HPI: 36 y.o. G0P0000 Married Caucasian female here for follow-up after being seen last week by Dr. Quincy Simmonds for acute onset of RLQ pain.  Pt has hx of biopsy proven endometriosis.  Has recently restarted her OCPs to see if this will help.  Other options have been reviewed but she does not want anything else.  Contemplating hysterectomy.  Ultrasound last week with Dr. Quincy Simmonds showed right hemorrhagic ovarian cyst with some free fluid in the cul de sac.  Pt reports she is still having pain, although improved, and felt this was abnormal so wanted to come in for follow-up.  Denies fever, nausea, urinary changes or bowel changes.  OTC ibuprofen isn't helping as much during the day.  Does not want narcotic medication.  Would like to know if there are any other options.  GYNECOLOGIC HISTORY: Patient's last menstrual period was 04/30/2017 (exact date). Contraception: OCP Menopausal hormone therapy: none   Patient Active Problem List   Diagnosis Date Noted  . Endometriosis 05/01/2017  . Pelvic pain in female 01/29/2016  . Spasm of bowel 01/29/2016  . IBS (irritable bowel syndrome) 01/29/2016    Past Medical History:  Diagnosis Date  . Abnormal Pap smear of cervix    2008 or 2009  . Dental bridge present    lower front  . Dry eyes   . Endometriosis   . Headache    Migraines  . Irritable bowel syndrome (IBS)   . Seasonal allergies   . Trigger thumb of right hand 11/2013    Past Surgical History:  Procedure Laterality Date  . CHOLECYSTECTOMY  06/2015  . CHROMOPERTUBATION N/A 05/30/2016   Procedure: CHROMOPERTUBATION;  Surgeon: Megan Salon, MD;  Location: [facility];  Service: Gynecology;  Laterality: N/A;  . COLPOSCOPY  2008  . LAPAROSCOPY N/A 05/30/2016   Procedure: LAPAROSCOPY OPERATIVE WITH LYSIS OF ADHESIONS, EXCISION OF POSSIBLE ENDOMETRIOSIS;  Surgeon: Megan Salon, MD;  Location: [facility];  Service: Gynecology;  Laterality: N/A;  . REFRACTIVE SURGERY  Bilateral 02/2015  . TONSILLECTOMY  1988  . TRIGGER FINGER RELEASE Right 12/23/2013   Procedure: RIGHT THUMB TRIGGER RELEASE ;  Surgeon: Tennis Must, MD;  Location: [facility];  Service: Orthopedics;  Laterality: Right;    MEDS:   Current Outpatient Prescriptions on File Prior to Visit  Medication Sig Dispense Refill  . Cholecalciferol (VITAMIN D PO) Take by mouth. occ    . fluticasone (FLONASE) 50 MCG/ACT nasal spray Place 2 sprays into both nostrils daily.    Marland Kitchen ibuprofen (ADVIL,MOTRIN) 800 MG tablet Take 1 tablet (800 mg total) by mouth every 8 (eight) hours as needed for mild pain, moderate pain or cramping. 30 tablet 4  . montelukast (SINGULAIR) 10 MG tablet Take 10 mg by mouth at bedtime.    . Multiple Vitamin (MULTIVITAMIN) tablet Take 1 tablet by mouth daily.    . norethindrone-ethinyl estradiol (JUNEL FE,GILDESS FE,LOESTRIN FE) 1-20 MG-MCG tablet Take 1 tablet by mouth daily. 1 Package 3  . Simethicone (GAS-X PO) Take 1 tablet by mouth daily as needed.     . SUMAtriptan Succinate (IMITREX PO) Take by mouth as needed.    . valACYclovir (VALTREX) 1000 MG tablet TAKE 1 TABLET (1,000 MG TOTAL) BY MOUTH DAILY. 90 tablet 0   No current facility-administered medications on file prior to visit.     ALLERGIES: Patient has no known allergies.  Family History  Problem Relation Age of  Onset  . Breast cancer Maternal Grandmother 28       died early 13's  . Hyperlipidemia Mother   . Heart disease Father   . Cancer Paternal Grandfather     SH:  Married, non smoker  Review of Systems  All other systems reviewed and are negative.   PHYSICAL EXAMINATION:    BP 102/66 (BP Location: Right Arm, Patient Position: Sitting, Cuff Size: Normal)   Pulse 72   Resp 16   Ht 5' 1.25" (1.556 m)   Wt 126 lb (57.2 kg)   LMP 04/30/2017 (Exact Date)   BMI 23.61 kg/m     General appearance: alert, cooperative and appears stated age CV:  Regular rate and rhythm Lungs:  clear to  auscultation, no wheezes, rales or rhonchi, symmetric air entry Abdomen: soft, mild RLQ tenderness, no rebound or guarding, bowel sounds normal; no masses,  no organomegaly  Chaperone was present for exam.  Assessment: RLQ pain Hemorrhagic corpus luteal cyst H/o endometriosis  Plan: Will change OCPs to loestrin 1.5/30 to see if can decrease ovulatory cycles and will try Tramadol 50mg , 1-2 tabs every 6 hours prn. #30/0RF.    Pt to give update next week.  Precautions for going earlier evaluation in ER or office reviewed.

## 2017-05-26 ENCOUNTER — Encounter: Payer: Self-pay | Admitting: Obstetrics & Gynecology

## 2017-06-07 ENCOUNTER — Encounter: Payer: Self-pay | Admitting: Obstetrics & Gynecology

## 2017-06-07 ENCOUNTER — Telehealth: Payer: Self-pay | Admitting: Obstetrics & Gynecology

## 2017-06-07 NOTE — Telephone Encounter (Signed)
Call to patient regarding My Chart message and options for Orlissa.  [name] has called previous call and offered 2 months free sample on "Fast Start" program. Patient states she has done research on [place] and has decided against proceeding with this option. States she is not tolerating the birth control pills.  Had full blown panic attack on Monday night. Knew she was being irrational but could not calm down. Complains of feeling excessively moody, ? Depressed, just not herself, " not me."  Thinks cycle may have been a little easier with OCP but side effects not worth it. Interested in hysterectomy in January.  Feels like she can tolerate pain until then to get through end of years at work. Will use condoms for contraception. Advised Dr Sabra Heck will review call. Will call her back if any instructions or recommendations. Patient will call when more definitive plan for proceeding with surgery.     Call to [name] with Edward Qualia, left message patient no longer interested in [place].

## 2017-06-07 NOTE — Telephone Encounter (Signed)
April, at [facility] for me patient support program is asking to talk with [name].

## 2017-06-07 NOTE — Telephone Encounter (Signed)
My Chart message from patient:  Message   Hi Dr Sabra Heck. I wanted to give you an update . After being back on the pill for over a month, I have started to feel like I did when I was on it before, and why I came off of it. . Not really like myself, hard feeling to explain. Mood swings and just feeling sort of different from my normal self. I think I am going to discontinue. I guess at this point since I don't feel like I can go this route and the Edward Qualia is not an option I'm comfortable with, surgery would be the next step? If a hysterectomy is my next step I'm thinking I will probably wait until after first of the year.   Just wanted to fill you in.  Thank you,  [name]  1981-01-14  [phone]    Select Font Size      [name]  06/07/2017  Patient Email  MRN:  [id number]  Description: 36 year old female Provider: Megan Salon, MD Department: [facility]

## 2017-06-08 NOTE — Telephone Encounter (Signed)
Ok to proceed with surgical planning.  We have discussed this several times.

## 2017-06-13 NOTE — Telephone Encounter (Signed)
Patient stated at last call she would plan for early next year. Will call back when ready to schedule.   Encounter closed.

## 2017-06-16 ENCOUNTER — Telehealth: Payer: Self-pay | Admitting: Obstetrics & Gynecology

## 2017-06-16 NOTE — Telephone Encounter (Signed)
Patient called requesting to speak with Gay Filler to discuss some surgery dates.

## 2017-06-19 NOTE — Telephone Encounter (Signed)
Return call to patient. Has decided she would like to proceed with surgery this year. Date options of 12-17 or 08-21-17 discussed. Patient will consider these and call back with decision. Discussed recovery and time out of work. 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    Should this be TLH/BS/poss cysto?

## 2017-06-19 NOTE — Telephone Encounter (Signed)
Yes.  TLH/bilateral salpingectomy, possible BSO, cystoscopy.

## 2017-06-20 ENCOUNTER — Telehealth: Payer: Self-pay | Admitting: *Deleted

## 2017-06-20 NOTE — Telephone Encounter (Signed)
Voice mail from patient. States she would like to proceed with surgery date of 08-21-17.  Will proceed with scheduling and call back once confirmed.

## 2017-06-20 NOTE — Telephone Encounter (Signed)
Returning a call to [place].

## 2017-06-20 NOTE — Telephone Encounter (Signed)
Call placed to patient to review benefits for a recommended surgery. Left voicemail message requesting a return call. °

## 2017-06-21 NOTE — Telephone Encounter (Signed)
See next phone encounter.  Encounter closed.

## 2017-06-22 NOTE — Telephone Encounter (Signed)
Call placed to patient to review benefits for a recommended surgery. Left voicemail message requesting a return call. °

## 2017-06-26 NOTE — Telephone Encounter (Signed)
Patient has some questions regarding surgery.

## 2017-06-26 NOTE — Telephone Encounter (Signed)
Return call to patient.  States she has scheduled surgery via phone calls and has decided she has some issues she would like to review with Dr Sabra Heck sooner than pre-op appointment. Consult scheduled for 07-04-17 with Dr Sabra Heck.   Routing to provider for final review. Patient agreeable to disposition. Will close encounter.

## 2017-06-29 ENCOUNTER — Telehealth: Payer: Self-pay | Admitting: Obstetrics & Gynecology

## 2017-06-29 NOTE — Telephone Encounter (Signed)
Patient states she is returning Suzy's call

## 2017-07-04 ENCOUNTER — Ambulatory Visit: Payer: BLUE CROSS/BLUE SHIELD | Admitting: Obstetrics & Gynecology

## 2017-07-04 ENCOUNTER — Encounter: Payer: Self-pay | Admitting: Obstetrics & Gynecology

## 2017-07-04 VITALS — BP 110/66 | HR 84 | Resp 16 | Ht 61.25 in | Wt 127.0 lb

## 2017-07-04 DIAGNOSIS — R102 Pelvic and perineal pain: Secondary | ICD-10-CM

## 2017-07-04 DIAGNOSIS — N946 Dysmenorrhea, unspecified: Secondary | ICD-10-CM

## 2017-07-04 DIAGNOSIS — N809 Endometriosis, unspecified: Secondary | ICD-10-CM

## 2017-07-07 NOTE — Progress Notes (Signed)
GYNECOLOGY  VISIT  CC:   Discuss surgery  HPI: 36 y.o. G79P0000 Married Caucasian female here with biopsy proven endometriosis here to discuss her desires to proceed with surgery.  She has many questions that she wants to address today.  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  Wants to reviewed laparoscopic procedure, recovery, need for help, limitations.  Wants to discuss possibility for ovary removal and if HRT is possible if needed. All questions answered.  Pt is sure that she does not want childbearing.  Is aware she will not be able to carry any children going forth after hysterectomy.  Clearly aware of this and desires to proceed.  GYNECOLOGIC HISTORY: Patient's last menstrual period was 07/01/2017. Contraception: OCPs   Patient Active Problem List   Diagnosis Date Noted  . Endometriosis 05/01/2017  . Pelvic pain in female 01/29/2016  . Spasm of bowel 01/29/2016  . IBS (irritable bowel syndrome) 01/29/2016    Past Medical History:  Diagnosis Date  . Abnormal Pap smear of cervix    2008 or 2009  . Dental bridge present    lower front  . Dry eyes   . Endometriosis   . Headache    Migraines  . Irritable bowel syndrome (IBS)   . Seasonal allergies   . Trigger thumb of right hand 11/2013    Past Surgical History:  Procedure Laterality Date  . CHOLECYSTECTOMY  06/2015  . CHROMOPERTUBATION N/A 05/30/2016   Performed by Megan Salon, MD at [facility]  . COLPOSCOPY  2008  . LAPAROSCOPY OPERATIVE WITH LYSIS OF ADHESIONS, EXCISION OF POSSIBLE ENDOMETRIOSIS N/A 05/30/2016   Performed by Megan Salon, MD at [facility]  . REFRACTIVE SURGERY Bilateral 02/2015  . RIGHT THUMB TRIGGER RELEASE Right 12/23/2013   Performed by Leanora Cover, MD at [facility]  . TONSILLECTOMY  1988    MEDS:   Current Outpatient Medications on File Prior to Visit  Medication Sig Dispense Refill  . Cholecalciferol (VITAMIN D PO) Take by mouth. occ    .  cyclobenzaprine (FLEXERIL) 5 MG tablet Take 1 tablet (5 mg total) by mouth 3 (three) times daily as needed for muscle spasms. 60 tablet 0  . fluticasone (FLONASE) 50 MCG/ACT nasal spray Place 2 sprays into both nostrils daily.    Marland Kitchen ibuprofen (ADVIL,MOTRIN) 800 MG tablet Take 1 tablet (800 mg total) by mouth every 8 (eight) hours as needed for mild pain, moderate pain or cramping. 30 tablet 4  . montelukast (SINGULAIR) 10 MG tablet Take 10 mg by mouth at bedtime.    . Multiple Vitamin (MULTIVITAMIN) tablet Take 1 tablet by mouth daily.    . Simethicone (GAS-X PO) Take 1 tablet by mouth daily as needed.     . SUMAtriptan Succinate (IMITREX PO) Take by mouth as needed.    . traMADol (ULTRAM) 50 MG tablet 50-100mg  every six hours.  400mg  max in 24 hours. 30 tablet 0  . valACYclovir (VALTREX) 1000 MG tablet TAKE 1 TABLET (1,000 MG TOTAL) BY MOUTH DAILY. 90 tablet 0   No current facility-administered medications on file prior to visit.     ALLERGIES: Patient has no known allergies.  Family History  Problem Relation Age of Onset  . Breast cancer Maternal Grandmother 28       died early 67's  . Hyperlipidemia Mother   . Heart disease Father   . Cancer Paternal Grandfather     SH:  Married, non smoker  Review of Systems  Constitutional: Negative.   Respiratory: Negative.   Cardiovascular: Negative.   Gastrointestinal: Negative.   Genitourinary:       Pelvic pain Dysmenorrhea    PHYSICAL EXAMINATION:    BP 110/66 (BP Location: Right Arm, Patient Position: Sitting, Cuff Size: Normal)   Pulse 84   Resp 16   Ht 5' 1.25" (1.556 m)   Wt 127 lb (57.6 kg)   LMP 07/01/2017   BMI 23.80 kg/m     No exam performed today  Assessment: Endometriosis Pelvic pain Dysmenorrhea  Plan: Questions answered today.  Pt ready to proceed with surgery.  Will return for pre-op when close to surgical date.   ~20 minutes spent with patient >50% of time was in face to face discussion of  above.

## 2017-07-14 ENCOUNTER — Other Ambulatory Visit: Payer: Self-pay | Admitting: Certified Nurse Midwife

## 2017-07-17 NOTE — Telephone Encounter (Signed)
Medication refill request: valACYclovir Last AEX:  03/24/17 DL Next AEX: 07/05/18 SM Last MMG (if hormonal medication request): 08/01/16 BIRADS 1 negative/density c Refill authorized: 04/14/17 #90 w/0 refills; today please advise; DL out of office

## 2017-07-24 ENCOUNTER — Telehealth: Payer: Self-pay | Admitting: Obstetrics & Gynecology

## 2017-07-24 NOTE — Telephone Encounter (Signed)
Return call to patient. Left message to call back. 

## 2017-07-24 NOTE — Telephone Encounter (Signed)
Patient called to speak with Deloris Ping and Gay Filler about her upcoming surgery. Routing to [place] first. Cc: Gay Filler

## 2017-07-25 NOTE — Telephone Encounter (Signed)
Return call to patient. Left message to call back. 

## 2017-07-25 NOTE — Telephone Encounter (Signed)
Returned call to patient. Left voicemail message requesting a return call °

## 2017-07-25 NOTE — Telephone Encounter (Signed)
Patient returned my call. Confirmed benefits. 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   Routing to [employer]

## 2017-07-25 NOTE — Telephone Encounter (Signed)
Call from patient. Surgery instruction sheet reviewed with patient and printed copy will be provided at consult appointment scheduled for 08-04-17. FMLA forms will be faxed. Questions answered.  .fin

## 2017-07-30 ENCOUNTER — Other Ambulatory Visit: Payer: Self-pay | Admitting: Obstetrics & Gynecology

## 2017-08-02 NOTE — Patient Instructions (Addendum)
Your procedure is scheduled on:  Monday, Dec. 31, 2018  Enter through the [employer] [facility] at:  9:45 AM  Pick up the phone at the desk and dial [phone].  Call this number if you have problems the morning of surgery: [phone].  Remember: Do NOT eat food or drink after:  Midnight Sunday  Take these medicines the morning of surgery with a SIP OF WATER:  None  Stop ALL herbal medications at this time  Do NOT smoke the day of surgery.  Do NOT wear jewelry (body piercing), metal hair clips/bobby pins, make-up, artifical eyelashes or nail polish. Do NOT wear lotions, powders, or perfumes.  You may wear deodorant. Do NOT shave for 48 hours prior to surgery. Do NOT bring valuables to the hospital. Contacts, dentures, or bridgework may not be worn into surgery.  Leave suitcase in car.  After surgery it may be brought to your room.  For patients admitted to the hospital, checkout time is 11:00 AM the day of discharge.  Bring a copy of your healthcare power of attorney and living will documents.  [name] - Preparing for Surgery Before surgery, you can play an important role.  Because skin is not sterile, your skin needs to be as free of germs as possible.  You can reduce the number of germs on your skin by washing with CHG (chlorahexidine gluconate) soap before surgery.  CHG is an antiseptic cleaner which kills germs and bonds with the skin to continue killing germs even after washing. Please DO NOT use if you have an allergy to CHG or antibacterial soaps.  If your skin becomes reddened/irritated stop using the CHG and inform your nurse when you arrive at Short Stay. Do not shave (including legs and underarms) for at least 48 hours prior to the first CHG shower.  You may shave your face/neck.  Please follow these instructions carefully:  1.  Shower with CHG Soap the night before surgery and the  morning of surgery.  2.  If you choose to wash your hair, wash your hair  first as usual with your normal  shampoo.  3.  After you shampoo, rinse your hair and body thoroughly to remove the shampoo.                             4.  Use CHG as you would any other liquid soap.  You can apply chg directly to the skin and wash.  Gently with a scrungie or clean washcloth.  5.  Apply the CHG Soap to your body ONLY FROM THE NECK DOWN.   Do   not use on face/ open                           Wound or open sores. Avoid contact with eyes, ears mouth and   genitals (private parts).                       Wash face,  Genitals (private parts) with your normal soap.             6.  Wash thoroughly, paying special attention to the area where your    surgery  will be performed.  7.  Thoroughly rinse your body with warm water from the neck down.  8.  DO NOT shower/wash with your normal soap after using and rinsing off  the CHG Soap.                9.  Pat yourself dry with a clean towel.            10.  Wear clean pajamas.            11.  Place clean sheets on your bed the night of your first shower and do not  sleep with pets. Day of Surgery : Do not apply any lotions/deodorants the morning of surgery.  Please wear clean clothes to the hospital/surgery center.  FAILURE TO FOLLOW THESE INSTRUCTIONS MAY RESULT IN THE CANCELLATION OF YOUR SURGERY  PATIENT SIGNATURE_________________________________  NURSE SIGNATURE__________________________________  ________________________________________________________________________   [name]  An incentive spirometer is a tool that can help keep your lungs clear and active. This tool measures how well you are filling your lungs with each breath. Taking long deep breaths may help reverse or decrease the chance of developing breathing (pulmonary) problems (especially infection) following:  A long period of time when you are unable to move or be active. BEFORE THE PROCEDURE   If the spirometer includes an indicator to show your best  effort, your nurse or respiratory therapist will set it to a desired goal.  If possible, sit up straight or lean slightly forward. Try not to slouch.  Hold the incentive spirometer in an upright position. INSTRUCTIONS FOR USE  1. Sit on the edge of your bed if possible, or sit up as far as you can in bed or on a chair. 2. Hold the incentive spirometer in an upright position. 3. Breathe out normally. 4. Place the mouthpiece in your mouth and seal your lips tightly around it. 5. Breathe in slowly and as deeply as possible, raising the piston or the ball toward the top of the column. 6. Hold your breath for 3-5 seconds or for as long as possible. Allow the piston or ball to fall to the bottom of the column. 7. Remove the mouthpiece from your mouth and breathe out normally. 8. Rest for a few seconds and repeat Steps 1 through 7 at least 10 times every 1-2 hours when you are awake. Take your time and take a few normal breaths between deep breaths. 9. The spirometer may include an indicator to show your best effort. Use the indicator as a goal to work toward during each repetition. 10. After each set of 10 deep breaths, practice coughing to be sure your lungs are clear. If you have an incision (the cut made at the time of surgery), support your incision when coughing by placing a pillow or rolled up towels firmly against it. Once you are able to get out of bed, walk around indoors and cough well. You may stop using the incentive spirometer when instructed by your caregiver.  RISKS AND COMPLICATIONS  Take your time so you do not get dizzy or light-headed.  If you are in pain, you may need to take or ask for pain medication before doing incentive spirometry. It is harder to take a deep breath if you are having pain. AFTER USE  Rest and breathe slowly and easily.  It can be helpful to keep track of a log of your progress. Your caregiver can provide you with a simple table to help with this. If you  are using the spirometer at home, follow these instructions: [name] IF:   You are having difficultly using the spirometer.  You have trouble using the spirometer  as often as instructed.  Your pain medication is not giving enough relief while using the spirometer.  You develop fever of 100.5 F (38.1 C) or higher. SEEK IMMEDIATE MEDICAL CARE IF:   You cough up bloody sputum that had not been present before.  You develop fever of 102 F (38.9 C) or greater.  You develop worsening pain at or near the incision site. MAKE SURE YOU:   Understand these instructions.  Will watch your condition.  Will get help right away if you are not doing well or get worse. Document Released: 12/19/2006 Document Revised: 10/31/2011 Document Reviewed: 02/19/2007 [facility] Patient Information 2014 [place], [place].   ________________________________________________________________________

## 2017-08-04 ENCOUNTER — Encounter: Payer: Self-pay | Admitting: Obstetrics & Gynecology

## 2017-08-04 ENCOUNTER — Ambulatory Visit: Payer: BLUE CROSS/BLUE SHIELD | Admitting: Obstetrics & Gynecology

## 2017-08-04 ENCOUNTER — Other Ambulatory Visit: Payer: Self-pay

## 2017-08-04 VITALS — BP 110/60 | HR 84 | Resp 18 | Ht 61.25 in | Wt 128.0 lb

## 2017-08-04 DIAGNOSIS — N809 Endometriosis, unspecified: Secondary | ICD-10-CM

## 2017-08-04 DIAGNOSIS — N946 Dysmenorrhea, unspecified: Secondary | ICD-10-CM

## 2017-08-04 DIAGNOSIS — Z01818 Encounter for other preprocedural examination: Secondary | ICD-10-CM | POA: Diagnosis not present

## 2017-08-04 LAB — POCT URINE PREGNANCY: Preg Test, Ur: NEGATIVE

## 2017-08-04 MED ORDER — ALPRAZOLAM 0.25 MG PO TABS
0.2500 mg | ORAL_TABLET | Freq: Every evening | ORAL | 0 refills | Status: DC | PRN
Start: 2017-08-04 — End: 2019-05-08

## 2017-08-04 MED ORDER — IBUPROFEN 800 MG PO TABS: 800.0000 mg | ORAL_TABLET | Freq: Three times a day (TID) | ORAL | 0 refills | Status: DC | PRN

## 2017-08-04 MED ORDER — OXYCODONE-ACETAMINOPHEN 5-325 MG PO TABS: 1.0000 | ORAL_TABLET | Freq: Four times a day (QID) | ORAL | 0 refills | Status: DC | PRN

## 2017-08-04 NOTE — Progress Notes (Signed)
36 y.o. G0P0000 MarriedCaucasian female here for discussion of upcoming procedure Total laparoscopic hysterectomy with Salpingectomy. Laparoscopic oophorectomy, possible unilateral or bilateral. Cystoscopy on 08/21/17. Planned due to endometriosis.  Pt has been counseled about other options.  Meical therapy treatment for endometriosis have been reviewed.  She has been on OCPs with over-all improvement at first that seems to have worked less effectively for her this past year.  She does not desire childbearing and wants definitve treatment..    Ob Hx:   Patient's last menstrual period was 07/31/2017.          Sexually active: Yes.   Birth control: withdrawal Last pap: 03/24/17 Neg. HR HPV:neg  Last MMG: 08/01/16 BIRADS1:neg. F/u age 77 Tobacco: No  Past Surgical History:  Procedure Laterality Date  . CHOLECYSTECTOMY  06/2015  . CHROMOPERTUBATION N/A 05/30/2016   Procedure: CHROMOPERTUBATION;  Surgeon: Megan Salon, MD;  Location: [facility];  Service: Gynecology;  Laterality: N/A;  . COLPOSCOPY  2008  . LAPAROSCOPY N/A 05/30/2016   Procedure: LAPAROSCOPY OPERATIVE WITH LYSIS OF ADHESIONS, EXCISION OF POSSIBLE ENDOMETRIOSIS;  Surgeon: Megan Salon, MD;  Location: [facility];  Service: Gynecology;  Laterality: N/A;  . REFRACTIVE SURGERY Bilateral 02/2015  . TONSILLECTOMY  1988  . TRIGGER FINGER RELEASE Right 12/23/2013   Procedure: RIGHT THUMB TRIGGER RELEASE ;  Surgeon: Tennis Must, MD;  Location: [facility];  Service: Orthopedics;  Laterality: Right;    Past Medical History:  Diagnosis Date  . Abnormal Pap smear of cervix    2008 or 2009  . Dental bridge present    lower front  . Dry eyes   . Endometriosis   . Headache    Migraines  . Irritable bowel syndrome (IBS)   . Seasonal allergies   . Trigger thumb of right hand 11/2013    Allergies: Patient has no known allergies.  Current Outpatient Medications  Medication Sig Dispense Refill  . Carboxymethylcellulose Sod PF  (REFRESH PLUS) 0.5 % SOLN Place 1 drop into both eyes 2 (two) times daily.    . Cholecalciferol (VITAMIN D3) 2000 units TABS Take 2,000 Units by mouth at bedtime.    . cyclobenzaprine (FLEXERIL) 5 MG tablet Take 1 tablet (5 mg total) by mouth 3 (three) times daily as needed for muscle spasms. 60 tablet 0  . fluticasone (FLONASE) 50 MCG/ACT nasal spray Place 2 sprays into both nostrils daily as needed for allergies.     Marland Kitchen ibuprofen (ADVIL,MOTRIN) 800 MG tablet Take 1 tablet (800 mg total) by mouth every 8 (eight) hours as needed for mild pain, moderate pain or cramping. 30 tablet 4  . montelukast (SINGULAIR) 10 MG tablet Take 10 mg by mouth at bedtime.    . Multiple Vitamin (MULTIVITAMIN WITH MINERALS) TABS tablet Take 1 tablet by mouth at bedtime.    . naproxen sodium (ALEVE) 220 MG tablet Take 220 mg by mouth 3 (three) times daily as needed (for pain.).    . Simethicone (GAS-X EXTRA STRENGTH) 125 MG CAPS Take 125 mg by mouth at bedtime.    . traMADol (ULTRAM) 50 MG tablet 50-100mg  every six hours.  400mg  max in 24 hours. (Patient taking differently: Take 50-100 mg by mouth every 6 (six) hours as needed (for pain. (Max 400 mg/24 hrs.)). ) 30 tablet 0  . valACYclovir (VALTREX) 1000 MG tablet TAKE 1 TABLET (1,000 MG TOTAL) BY MOUTH DAILY. (Patient taking differently: Take 1,000 mg by mouth at bedtime. ) 90 tablet 4  No current facility-administered medications for this visit.     ROS: complete ROS was negative except for chronic pelvic pain.  Exam:    BP 110/60 (BP Location: Right Arm, Patient Position: Sitting, Cuff Size: Normal)   Pulse 84   Resp 18   Ht 5' 1.25" (1.556 m)   Wt 128 lb (58.1 kg)   LMP 07/31/2017   BMI 23.99 kg/m   General appearance: alert and cooperative Head: Normocephalic, without obvious abnormality, atraumatic Neck: no adenopathy, supple, symmetrical, trachea midline and thyroid not enlarged, symmetric, no tenderness/mass/nodules Lungs: clear to auscultation  bilaterally Heart: regular rate and rhythm, S1, S2 normal, no murmur, click, rub or gallop Abdomen: soft, non-tender; bowel sounds normal; no masses,  no organomegaly Extremities: extremities normal, atraumatic, no cyanosis or edema Skin: Skin color, texture, turgor normal. No rashes or lesions Lymph nodes: Cervical, supraclavicular, and axillary nodes normal. no inguinal nodes palpated Neurologic: Grossly normal  Pelvic: External genitalia:  no lesions              Urethra: normal appearing urethra with no masses, tenderness or lesions              Bartholins and Skenes: normal                 Vagina: normal appearing vagina with normal color and discharge, no lesions              Cervix: normal appearance              Pap taken: No.        Bimanual Exam:  Uterus:  uterus is normal size, shape, consistency and nontender                                      Adnexa:    normal adnexa in size, nontender and no masses                                        A: Chronic pelvic pain Dysmenorrhea Endometriosis, biopsy proven    P:  TLH/bilateral salpingectomy, cystoscopy planned Rx for Motrin and Percocet given. Medications/Vitamins reviewed.  Pt knows needs to stop any ASA products. Hysterectomy brochure given for pre and post op instructions. Rx for Motrin, Percocet, and Xanax given.

## 2017-08-08 ENCOUNTER — Other Ambulatory Visit: Payer: Self-pay

## 2017-08-08 ENCOUNTER — Other Ambulatory Visit: Payer: Self-pay | Admitting: Obstetrics & Gynecology

## 2017-08-08 ENCOUNTER — Encounter (HOSPITAL_COMMUNITY)
Admission: RE | Admit: 2017-08-08 | Discharge: 2017-08-08 | Disposition: A | Discharge status: Home / Self Care | Payer: BLUE CROSS/BLUE SHIELD | Source: Ambulatory Visit | Attending: Obstetrics & Gynecology | Admitting: Obstetrics & Gynecology

## 2017-08-08 ENCOUNTER — Encounter (HOSPITAL_COMMUNITY): Payer: Self-pay

## 2017-08-08 DIAGNOSIS — R102 Pelvic and perineal pain: Secondary | ICD-10-CM | POA: Diagnosis not present

## 2017-08-08 DIAGNOSIS — Z01812 Encounter for preprocedural laboratory examination: Secondary | ICD-10-CM | POA: Diagnosis not present

## 2017-08-08 DIAGNOSIS — G8929 Other chronic pain: Secondary | ICD-10-CM | POA: Diagnosis not present

## 2017-08-08 DIAGNOSIS — N809 Endometriosis, unspecified: Secondary | ICD-10-CM | POA: Insufficient documentation

## 2017-08-08 HISTORY — DX: Hypoglycemia, unspecified: E16.2

## 2017-08-08 HISTORY — DX: Anxiety disorder, unspecified: F41.9

## 2017-08-08 HISTORY — DX: Personal history of other diseases of the digestive system: Z87.19

## 2017-08-08 LAB — CBC
HEMATOCRIT: 37.6 % (ref 36.0–46.0)
Hemoglobin: 12.7 g/dL (ref 12.0–15.0)
MCH: 31.7 pg (ref 26.0–34.0)
MCHC: 33.8 g/dL (ref 30.0–36.0)
MCV: 93.8 fL (ref 78.0–100.0)
PLATELETS: 261 10*3/uL (ref 150–400)
RBC: 4.01 MIL/uL (ref 3.87–5.11)
RDW: 12.1 % (ref 11.5–15.5)
WBC: 7.9 10*3/uL (ref 4.0–10.5)

## 2017-08-21 ENCOUNTER — Ambulatory Visit (HOSPITAL_COMMUNITY): Payer: BLUE CROSS/BLUE SHIELD | Admitting: Certified Registered Nurse Anesthetist

## 2017-08-21 ENCOUNTER — Encounter (HOSPITAL_COMMUNITY)
Admission: AD | Discharge status: Against Medical Advice | Payer: Self-pay | Source: Ambulatory Visit | Attending: Obstetrics & Gynecology

## 2017-08-21 ENCOUNTER — Other Ambulatory Visit: Payer: Self-pay

## 2017-08-21 ENCOUNTER — Ambulatory Visit (HOSPITAL_COMMUNITY)
Admission: AD | Admit: 2017-08-21 | Discharge: 2017-08-21 | Discharge status: Against Medical Advice | Payer: BLUE CROSS/BLUE SHIELD | Source: Ambulatory Visit | Attending: Obstetrics & Gynecology | Admitting: Obstetrics & Gynecology

## 2017-08-21 ENCOUNTER — Encounter (HOSPITAL_COMMUNITY): Payer: Self-pay

## 2017-08-21 DIAGNOSIS — Z8249 Family history of ischemic heart disease and other diseases of the circulatory system: Secondary | ICD-10-CM | POA: Diagnosis not present

## 2017-08-21 DIAGNOSIS — N800 Endometriosis of uterus: Secondary | ICD-10-CM | POA: Insufficient documentation

## 2017-08-21 DIAGNOSIS — N809 Endometriosis, unspecified: Secondary | ICD-10-CM | POA: Diagnosis present

## 2017-08-21 DIAGNOSIS — Z79899 Other long term (current) drug therapy: Secondary | ICD-10-CM | POA: Insufficient documentation

## 2017-08-21 DIAGNOSIS — D251 Intramural leiomyoma of uterus: Secondary | ICD-10-CM | POA: Insufficient documentation

## 2017-08-21 DIAGNOSIS — Q5279 Other congenital malformations of vulva: Secondary | ICD-10-CM | POA: Insufficient documentation

## 2017-08-21 DIAGNOSIS — N803 Endometriosis of pelvic peritoneum: Secondary | ICD-10-CM | POA: Insufficient documentation

## 2017-08-21 DIAGNOSIS — N946 Dysmenorrhea, unspecified: Secondary | ICD-10-CM | POA: Diagnosis not present

## 2017-08-21 DIAGNOSIS — F419 Anxiety disorder, unspecified: Secondary | ICD-10-CM | POA: Diagnosis not present

## 2017-08-21 DIAGNOSIS — N9069 Other specified hypertrophy of vulva: Secondary | ICD-10-CM | POA: Diagnosis not present

## 2017-08-21 DIAGNOSIS — K589 Irritable bowel syndrome without diarrhea: Secondary | ICD-10-CM | POA: Diagnosis not present

## 2017-08-21 DIAGNOSIS — Z87891 Personal history of nicotine dependence: Secondary | ICD-10-CM | POA: Insufficient documentation

## 2017-08-21 DIAGNOSIS — G8929 Other chronic pain: Secondary | ICD-10-CM | POA: Insufficient documentation

## 2017-08-21 DIAGNOSIS — R102 Pelvic and perineal pain: Secondary | ICD-10-CM | POA: Diagnosis not present

## 2017-08-21 DIAGNOSIS — D259 Leiomyoma of uterus, unspecified: Secondary | ICD-10-CM | POA: Diagnosis not present

## 2017-08-21 HISTORY — PX: TOTAL LAPAROSCOPIC HYSTERECTOMY WITH SALPINGECTOMY: SHX6742

## 2017-08-21 HISTORY — PX: CYSTOSCOPY: SHX5120

## 2017-08-21 HISTORY — PX: LABIOPLASTY: SHX1900

## 2017-08-21 LAB — HCG, SERUM, QUALITATIVE: PREG SERUM: NEGATIVE

## 2017-08-21 LAB — HEMOGLOBIN: Hemoglobin: 13 g/dL (ref 12.0–15.0)

## 2017-08-21 SURGERY — HYSTERECTOMY, TOTAL, LAPAROSCOPIC, WITH SALPINGECTOMY
Anesthesia: General | Site: Urethra

## 2017-08-21 MED ORDER — PROPOFOL 10 MG/ML IV BOLUS
INTRAVENOUS | Status: AC
  Filled 2017-08-21: qty 20

## 2017-08-21 MED ORDER — FENTANYL CITRATE (PF) 250 MCG/5ML IJ SOLN
INTRAMUSCULAR | Status: AC
  Filled 2017-08-21: qty 5

## 2017-08-21 MED ORDER — MORPHINE SULFATE (PF) 4 MG/ML IV SOLN: 1.0000 mg | INTRAVENOUS | Status: DC | PRN

## 2017-08-21 MED ORDER — SIMETHICONE 80 MG PO CHEW: 80.0000 mg | CHEWABLE_TABLET | Freq: Four times a day (QID) | ORAL | Status: DC | PRN

## 2017-08-21 MED ORDER — MONTELUKAST SODIUM 10 MG PO TABS
10.0000 mg | ORAL_TABLET | Freq: Every day | ORAL | Status: DC
  Filled 2017-08-21: qty 1

## 2017-08-21 MED ORDER — PROPOFOL 10 MG/ML IV BOLUS
INTRAVENOUS | Status: DC | PRN
  Administered 2017-08-21: 200 mg via INTRAVENOUS

## 2017-08-21 MED ORDER — ROCURONIUM BROMIDE 100 MG/10ML IV SOLN
INTRAVENOUS | Status: DC | PRN
  Administered 2017-08-21: 50 mg via INTRAVENOUS

## 2017-08-21 MED ORDER — BUPIVACAINE HCL (PF) 0.25 % IJ SOLN
INTRAMUSCULAR | Status: AC
  Filled 2017-08-21: qty 30

## 2017-08-21 MED ORDER — LACTATED RINGERS IV SOLN
INTRAVENOUS | Status: DC
  Administered 2017-08-21 (×3): via INTRAVENOUS

## 2017-08-21 MED ORDER — ROPIVACAINE HCL 5 MG/ML IJ SOLN
INTRAMUSCULAR | Status: AC
  Filled 2017-08-21: qty 30

## 2017-08-21 MED ORDER — ONDANSETRON HCL 4 MG/2ML IJ SOLN
INTRAMUSCULAR | Status: DC | PRN
  Administered 2017-08-21: 4 mg via INTRAVENOUS

## 2017-08-21 MED ORDER — DEXAMETHASONE SODIUM PHOSPHATE 10 MG/ML IJ SOLN
INTRAMUSCULAR | Status: AC
  Filled 2017-08-21: qty 1

## 2017-08-21 MED ORDER — SODIUM CHLORIDE 0.9 % IJ SOLN
INTRAMUSCULAR | Status: AC
  Filled 2017-08-21: qty 50

## 2017-08-21 MED ORDER — DEXAMETHASONE SODIUM PHOSPHATE 10 MG/ML IJ SOLN
INTRAMUSCULAR | Status: DC | PRN
  Administered 2017-08-21: 10 mg via INTRAVENOUS

## 2017-08-21 MED ORDER — KETOROLAC TROMETHAMINE 30 MG/ML IJ SOLN
30.0000 mg | Freq: Four times a day (QID) | INTRAMUSCULAR | Status: DC
  Administered 2017-08-21: 30 mg via INTRAVENOUS
  Filled 2017-08-21: qty 1

## 2017-08-21 MED ORDER — PANTOPRAZOLE SODIUM 40 MG IV SOLR
40.0000 mg | Freq: Every day | INTRAVENOUS | Status: DC
  Filled 2017-08-21: qty 40

## 2017-08-21 MED ORDER — ACETAMINOPHEN 10 MG/ML IV SOLN
1000.0000 mg | Freq: Once | INTRAVENOUS | Status: AC
  Administered 2017-08-21: 1000 mg via INTRAVENOUS
  Filled 2017-08-21: qty 100

## 2017-08-21 MED ORDER — SCOPOLAMINE 1 MG/3DAYS TD PT72
MEDICATED_PATCH | TRANSDERMAL | Status: AC
  Administered 2017-08-21: 1.5 mg via TRANSDERMAL
  Filled 2017-08-21: qty 1

## 2017-08-21 MED ORDER — MIDAZOLAM HCL 2 MG/2ML IJ SOLN
INTRAMUSCULAR | Status: DC | PRN
  Administered 2017-08-21: 2 mg via INTRAVENOUS

## 2017-08-21 MED ORDER — HYDROMORPHONE HCL 1 MG/ML IJ SOLN
0.2500 mg | INTRAMUSCULAR | Status: DC | PRN
  Administered 2017-08-21: 0.5 mg via INTRAVENOUS

## 2017-08-21 MED ORDER — SCOPOLAMINE 1 MG/3DAYS TD PT72
1.0000 | MEDICATED_PATCH | Freq: Once | TRANSDERMAL | Status: DC
  Administered 2017-08-21: 1.5 mg via TRANSDERMAL

## 2017-08-21 MED ORDER — STERILE WATER FOR IRRIGATION IR SOLN
Status: DC | PRN
  Administered 2017-08-21: 1000 mL via INTRAVESICAL

## 2017-08-21 MED ORDER — SODIUM CHLORIDE 0.9 % IV SOLN
INTRAVENOUS | Status: DC | PRN
  Administered 2017-08-21: 11:00:00

## 2017-08-21 MED ORDER — GLYCOPYRROLATE 0.2 MG/ML IJ SOLN
INTRAMUSCULAR | Status: AC
  Filled 2017-08-21: qty 1

## 2017-08-21 MED ORDER — KETOROLAC TROMETHAMINE 30 MG/ML IJ SOLN: 30.0000 mg | Freq: Four times a day (QID) | INTRAMUSCULAR | Status: DC

## 2017-08-21 MED ORDER — BUPIVACAINE HCL (PF) 0.25 % IJ SOLN
INTRAMUSCULAR | Status: DC | PRN
  Administered 2017-08-21: 10 mL
  Administered 2017-08-21: 20 mL

## 2017-08-21 MED ORDER — ACETAMINOPHEN 325 MG PO TABS: 650.0000 mg | ORAL_TABLET | ORAL | Status: DC | PRN

## 2017-08-21 MED ORDER — PROMETHAZINE HCL 25 MG/ML IJ SOLN: 6.2500 mg | INTRAMUSCULAR | Status: DC | PRN

## 2017-08-21 MED ORDER — CEFOTETAN DISODIUM-DEXTROSE 2-2.08 GM-%(50ML) IV SOLR
2.0000 g | INTRAVENOUS | Status: AC
  Administered 2017-08-21: 2 g via INTRAVENOUS

## 2017-08-21 MED ORDER — MENTHOL 3 MG MT LOZG: 1.0000 | LOZENGE | OROMUCOSAL | Status: DC | PRN

## 2017-08-21 MED ORDER — CEFOTETAN DISODIUM-DEXTROSE 2-2.08 GM-%(50ML) IV SOLR
INTRAVENOUS | Status: AC
  Filled 2017-08-21: qty 50

## 2017-08-21 MED ORDER — HYDROMORPHONE HCL 1 MG/ML IJ SOLN
INTRAMUSCULAR | Status: AC
  Filled 2017-08-21: qty 0.5

## 2017-08-21 MED ORDER — OXYCODONE-ACETAMINOPHEN 5-325 MG PO TABS: 1.0000 | ORAL_TABLET | ORAL | Status: DC | PRN

## 2017-08-21 MED ORDER — MIDAZOLAM HCL 2 MG/2ML IJ SOLN
INTRAMUSCULAR | Status: AC
  Filled 2017-08-21: qty 2

## 2017-08-21 MED ORDER — KETOROLAC TROMETHAMINE 30 MG/ML IJ SOLN
INTRAMUSCULAR | Status: AC
  Filled 2017-08-21: qty 1

## 2017-08-21 MED ORDER — KETOROLAC TROMETHAMINE 30 MG/ML IJ SOLN
INTRAMUSCULAR | Status: DC | PRN
  Administered 2017-08-21: 30 mg via INTRAVENOUS

## 2017-08-21 MED ORDER — ROCURONIUM BROMIDE 100 MG/10ML IV SOLN
INTRAVENOUS | Status: AC
  Filled 2017-08-21: qty 1

## 2017-08-21 MED ORDER — LIDOCAINE HCL (CARDIAC) 20 MG/ML IV SOLN
INTRAVENOUS | Status: DC | PRN
  Administered 2017-08-21: 30 mg via INTRAVENOUS

## 2017-08-21 MED ORDER — FENTANYL CITRATE (PF) 100 MCG/2ML IJ SOLN
INTRAMUSCULAR | Status: DC | PRN
  Administered 2017-08-21 (×2): 50 ug via INTRAVENOUS
  Administered 2017-08-21: 150 ug via INTRAVENOUS
  Administered 2017-08-21: 100 ug via INTRAVENOUS

## 2017-08-21 MED ORDER — DEXTROSE-NACL 5-0.45 % IV SOLN: INTRAVENOUS | Status: DC

## 2017-08-21 MED ORDER — KETOROLAC TROMETHAMINE 30 MG/ML IJ SOLN: 30.0000 mg | Freq: Once | INTRAMUSCULAR | Status: DC | PRN

## 2017-08-21 MED ORDER — FENTANYL CITRATE (PF) 100 MCG/2ML IJ SOLN
INTRAMUSCULAR | Status: AC
  Filled 2017-08-21: qty 2

## 2017-08-21 MED ORDER — ALUM & MAG HYDROXIDE-SIMETH 200-200-20 MG/5ML PO SUSP: 30.0000 mL | ORAL | Status: DC | PRN

## 2017-08-21 MED ORDER — ONDANSETRON HCL 4 MG/2ML IJ SOLN
INTRAMUSCULAR | Status: AC
  Filled 2017-08-21: qty 2

## 2017-08-21 MED ORDER — LIDOCAINE HCL (CARDIAC) 20 MG/ML IV SOLN
INTRAVENOUS | Status: AC
  Filled 2017-08-21: qty 5

## 2017-08-21 SURGICAL SUPPLY — 52 items
APPLICATOR ARISTA FLEXITIP XL (MISCELLANEOUS) ×5 IMPLANT
BARRIER ADHS 3X4 INTERCEED (GAUZE/BANDAGES/DRESSINGS) ×5 IMPLANT
CABLE HIGH FREQUENCY MONO STRZ (ELECTRODE) IMPLANT
COVER LIGHT HANDLE  1/PK (MISCELLANEOUS) ×1
COVER LIGHT HANDLE 1/PK (MISCELLANEOUS) ×4 IMPLANT
COVER MAYO STAND STRL (DRAPES) ×5 IMPLANT
DERMABOND ADVANCED (GAUZE/BANDAGES/DRESSINGS) ×1
DERMABOND ADVANCED .7 DNX12 (GAUZE/BANDAGES/DRESSINGS) ×4 IMPLANT
DURAPREP 26ML APPLICATOR (WOUND CARE) ×5 IMPLANT
GLOVE BIOGEL PI IND STRL 7.0 (GLOVE) ×16 IMPLANT
GLOVE BIOGEL PI INDICATOR 7.0 (GLOVE) ×4
GLOVE ECLIPSE 6.5 STRL STRAW (GLOVE) ×10 IMPLANT
GOWN STRL REUS W/TWL LRG LVL3 (GOWN DISPOSABLE) ×20 IMPLANT
HEMOSTAT ARISTA ABSORB 3G PWDR (MISCELLANEOUS) ×5 IMPLANT
LIGASURE VESSEL 5MM BLUNT TIP (ELECTROSURGICAL) ×5 IMPLANT
NEEDLE INSUFFLATION 120MM (ENDOMECHANICALS) ×5 IMPLANT
NS IRRIG 1000ML POUR BTL (IV SOLUTION) ×5 IMPLANT
OCCLUDER COLPOPNEUMO (BALLOONS) ×5 IMPLANT
PACK LAPAROSCOPY BASIN (CUSTOM PROCEDURE TRAY) ×5 IMPLANT
PACK TRENDGUARD 450 HYBRID PRO (MISCELLANEOUS) ×4 IMPLANT
PACK TRENDGUARD 600 HYBRD PROC (MISCELLANEOUS) IMPLANT
PACK VAGINAL MINOR WOMEN LF (CUSTOM PROCEDURE TRAY) IMPLANT
PAD OB MATERNITY 4.3X12.25 (PERSONAL CARE ITEMS) ×5 IMPLANT
POUCH LAPAROSCOPIC INSTRUMENT (MISCELLANEOUS) ×5 IMPLANT
PROTECTOR NERVE ULNAR (MISCELLANEOUS) ×10 IMPLANT
SCISSORS LAP 5X35 DISP (ENDOMECHANICALS) IMPLANT
SET CYSTO W/LG BORE CLAMP LF (SET/KITS/TRAYS/PACK) ×5 IMPLANT
SET IRRIG TUBING LAPAROSCOPIC (IRRIGATION / IRRIGATOR) ×5 IMPLANT
SET TRI-LUMEN FLTR TB AIRSEAL (TUBING) ×5 IMPLANT
SHEARS HARMONIC ACE PLUS 36CM (ENDOMECHANICALS) ×5 IMPLANT
SUT VIC AB 0 CT1 27 (SUTURE) ×2
SUT VIC AB 0 CT1 27XBRD ANBCTR (SUTURE) ×8 IMPLANT
SUT VIC AB 0 CT2 27 (SUTURE) IMPLANT
SUT VIC AB 2-0 SH 27 (SUTURE)
SUT VIC AB 2-0 SH 27XBRD (SUTURE) IMPLANT
SUT VICRYL 4-0 PS2 18IN ABS (SUTURE) ×10 IMPLANT
SUT VLOC 180 0 9IN  GS21 (SUTURE) ×1
SUT VLOC 180 0 9IN GS21 (SUTURE) ×4 IMPLANT
SYR 10ML LL (SYRINGE) ×5 IMPLANT
SYR 50ML LL SCALE MARK (SYRINGE) ×10 IMPLANT
TIP UTERINE 5.1X6CM LAV DISP (MISCELLANEOUS) ×5 IMPLANT
TIP UTERINE 6.7X10CM GRN DISP (MISCELLANEOUS) IMPLANT
TIP UTERINE 6.7X6CM WHT DISP (MISCELLANEOUS) IMPLANT
TIP UTERINE 6.7X8CM BLUE DISP (MISCELLANEOUS) IMPLANT
TOWEL OR 17X24 6PK STRL BLUE (TOWEL DISPOSABLE) ×10 IMPLANT
TRENDGUARD 450 HYBRID PRO PACK (MISCELLANEOUS) ×5
TRENDGUARD 600 HYBRID PROC PK (MISCELLANEOUS)
TROCAR ADV FIXATION 5X100MM (TROCAR) ×5 IMPLANT
TROCAR PORT AIRSEAL 5X120 (TROCAR) ×5 IMPLANT
TROCAR XCEL NON BLADE 8MM B8LT (ENDOMECHANICALS) ×5 IMPLANT
TROCAR XCEL NON-BLD 5MMX100MML (ENDOMECHANICALS) ×5 IMPLANT
WARMER LAPAROSCOPE (MISCELLANEOUS) ×5 IMPLANT

## 2017-08-21 NOTE — Transfer of Care (Signed)
Immediate Anesthesia Transfer of Care Note  Patient: [name]  Procedure(s) Performed: TOTAL LAPAROSCOPIC HYSTERECTOMY WITH SALPINGECTOMY (Bilateral Abdomen) LAPAROSCOPIC OOPHORECTOMY possible unilateral or bilateral (Bilateral Abdomen) CYSTOSCOPY (N/A Urethra) LABIAPLASTY (N/A Perineum)  Patient Location: PACU  Anesthesia Type:General  Level of Consciousness: awake, alert , oriented and patient cooperative  Airway & Oxygen Therapy: Patient Spontanous Breathing and Patient connected to nasal cannula oxygen  Post-op Assessment: Report given to RN, Post -op Vital signs reviewed and stable and Patient moving all extremities X 4  Post vital signs: Reviewed and stable  Last Vitals:  Vitals:   08/21/17 0855  BP: (!) 127/96  Pulse: 85  Resp: 18  Temp: 37 C  SpO2: 99%    Last Pain:  Vitals:   08/21/17 0855  TempSrc: Oral  PainSc: 1       Patients Stated Pain Goal: 3 (03/50/09 3818)  Complications: No apparent anesthesia complications

## 2017-08-21 NOTE — Op Note (Signed)
08/21/2017  12:46 PM  PATIENT:  [name]  36 y.o. female  PRE-OPERATIVE DIAGNOSIS:  pelvic pain in female, endometriosis, dysmenorrhea, asymmetric labia with labia pain  POST-OPERATIVE DIAGNOSIS:  pelvic pain in female, endometriosis, dysmenorrhea, asymmetric labia with labia pain  PROCEDURE:  Procedure(s): TOTAL LAPAROSCOPIC HYSTERECTOMY WITH SALPINGECTOMY LAPAROSCOPIC OOPHORECTOMY possible unilateral or bilateral CYSTOSCOPY RESECTION OF ENDOMETRIOSIS LABIAPLASTY  SURGEON:  Megan Salon  ASSISTANTS: Dr. Sumner Boast   ANESTHESIA:   general  ESTIMATED BLOOD LOSS: 50 mL  BLOOD ADMINISTERED:none   FLUIDS: 2000ccLR  UOP: 200cc clear UOP  SPECIMEN:  Uterus, cervix, bilateral fallopian tubes, left peritoneum from endometriosis resection  DISPOSITION OF SPECIMEN:  PATHOLOGY  FINDINGS: normal uterus, fallopian tubes, ovaries, endometriosis along left sidewall  DESCRIPTION OF OPERATION: Patient is taken to the operating room. She is placed in the supine position. She is a running IV in place. Informed consent was present on the chart. SCDs on her lower extremities and functioning properly. Patient was positioned while she was awake.  Her legs were placed in the low lithotomy position in [place]. Her arms were tucked by the side.  General endotracheal anesthesia was administered by the anesthesia staff without difficulty.  Time out performed.    Chlora prep was then used to prep the abdomen and Betadine was used to prep the inner thighs, perineum and vagina. Once 3 minutes had past the patient was draped in a normal standard fashion. The legs were lifted to the high lithotomy position. The cervix was visualized by placing a heavy weighted speculum in the posterior aspect of the vagina and using a curved Deaver retractor to the retract anteriorly. The anterior lip of the cervix was grasped with single-tooth tenaculum.  The uterus sounded to 7 cm. Pratt dilators were used  to dilate the cervix up to a #19. A RUMI uterine manipulator was obtained. A # 6, thin purple disposable tip was placed on the RUMI manipulator as well as a 2.5, silver KOH ring. This was passed through the cervix and the bulb of the disposable tip was inflated with 6 cc of normal saline. There was a good fit of the KOH ring around the cervix. The tenaculum was removed. There is also good manipulation of the uterus. The speculum and retractor were removed as well. A Foley catheter was placed to straight drain.  Clear urine was noted. Legs were lowered to the low lithotomy position and attention was turned the abdomen.  The umbilicus was everted.  A Veress needle was obtained. Syringe of sterile saline was placed on a open Veress needle.  This was passed into the umbilicus until just when the fluid started to drip.  Then low flow CO2 gas was attached the needle and the pneumoperitoneum was achieved without difficulty. Once four liters of gas was in the abdomen the Veress needle was removed and a 5 millimeter non-bladed Optiview trocar and port were passed directly to the abdomen. The laparoscope was then used to confirm intraperitoneal placement. A large uterus with fundal fibroid was noted.  There was some endometriosis appearing tissue on the back of the cervix.  Ovaries were normal.  Are where prior tubal reanastomosis had been performed was seen and looks really nice, without any adhesions.  Locations for RLQ, LLQ, and suprapubic ports were noted by transillumination of the abdominal wall.  0.25% marcaine was used to anesthetize the skin.  72mm skin incision was made in the RLQ and an AirSeal port was placed underdirect  visualization of the laparoscope.  Then a 68mm skin incision was made and a 17mm nonbladed trochar and port was placed in the LLQ.  Finally, and 12mm skin incision was made about 4cm above the pubic symphasis and an 41mm non-bladed port was placed with direct visualization of the laparoscope.  All  trochars were removed.    Ureters were identifies.  Attention was turned to the left side. With uterus on stretch the left tube was excised off the ovary and mesosalpinx was dissected to free the tube. Then the left utero-ovarian pedicle was serially clamped cauterized and incised using the ligasure device. Left round ligament was serially clamped cauterized and incised. The anterior and posterior peritoneum of the inferior leaf of the broad ligament were opened. The beginning of the baldde flap was created.  The bladder was taken down below the level of the KOH ring. The left uterine artery skeletonized and then just superior to the KOH ring this vessel was serially clamped, cauterized, and incised.  Attention was turned the right side.  The uterus was placed on stretch to the opposite side.  The tube was excised off the ovary using sharp dissection a bipolar cautery.  The mesosalpinx was incised freeing the tube. Then the right uterine ovarian pedicle was serially clamped cauterized and incised. Next the right round ligament was serially clamped cauterized and incised. The anterior posterior peritoneum of the inferiorly for the broad ligament were opened. The anterior peritoneum was carried across to the dissection on the left side. The remainder of the bladder flap was created using sharp dissection. The bladder was well below the level of the KOH ring. The left uterine artery skeletonized. Then the left uterine artery, above the level of the KOH ring, was serially clamped cauterized and incised. The uterus was devascularized at this point.  The colpotomy was performed a starting in the midline and using a harmonic scalpel with the inferior edge of the open blade  This was carried around a circumferential fashion until the vaginal mucosa was completely incised in the specimen was freed.  The specimen was then delivered to the vagina.  A vaginal occlusive device was used to maintain the  pneumoperitoneum  Instruments were changed with a needle driver and kobra graspers.  Using a 9 inch V. lock suture, the cuff was closed by incorporating the anterior and posterior vaginal mucosa in each stitch. This was carried across all the way to the left corner and a running fashion. Two stitches were brought back towards the midline and the suture was cut flush with the vagina. The needle was brought out the pelvis. The pelvis was irrigated. All pedicles were inspected. No bleeding was noted.   The area of endometriosis along the left sidewall was then excised. The peritoneum was lifted all the side wall.  A small incision was made with the harmonic scalpel.  The pertitoneum was placed on stretch and with careful dissection the entire area of endometriosis was excised without being near any sidewall structures.  Excellent hemosis was present.  An Interceed was placed across this area of dissection to decrease any risk of adhesion formation.    At this point the procedure was completed.   The ports were removed under direct visualization of the laparoscope and the pneumoperitoneum was relieved.  The patient was taken out of Trendelenburg positioning.  Several deep breaths were given to the patient's trying to any gas the abdomen and finally the midline port was removed.  The skin  was then closed with subcuticular stitches of 3-0 Vicryl. The skin was cleansed Dermabond was applied. Attention was then turned the vagina and the cuff was inspected. No bleeding was noted. The anterior posterior vaginal mucosa was incorporated in each stitch. The Foley catheter was removed.  Cystoscopy was performed.  No sutures or bladder injuries were noted.  Entire bladder was visualized including the dome and bubble present in the bladder.  Ureters were noted with normal urine jets from each one was seen.  Foley was left out after the cystoscopic fluid was drained and cystoscope removed.  Attention was turned to right  labia minora.  Using a marking pen, the labia was marked to make it symmetric with the left.  Using Metzenbaum scissors, the excess tissue was excised.  Single interrupted stitches of #4.0 Vicryl were placed.  Excellent hemostasis was present.    Sponge, lap, needle, initially counts were correct x2. Patient tolerated the procedure very well. She was awakened from anesthesia, extubated and taken to recovery in stable condition.   COUNTS:  YES  PLAN OF CARE: Transfer to PACU

## 2017-08-21 NOTE — H&P (Signed)
[name] is an 36 y.o. female G0 MWF here for definitive surgery and treatment of endometriosis, chronic pelvic pain, dysmenorrhea.  She did have a laparoscopy with biopsy proven endometriosis.  She has been on OCPs and has decided to proceed with more definitive treatment.  She is aware I am planning to save at least one ovary if possible.  Procedure, risks, benefits and alternatives have been discussed.  She did consider medical therapy for endometriosis as well but insurance declined treatment.  She is aware that she will not be able to have childbearing after this procedure.  She and her husband do not desire children and she is ok with this decision.  Pt does have asymmetric labia and would like right side decreased in size.  Am planning on doing this today as well.  Procedure, risks, benefits, alternatives have been discussed for this as well.  All questions have been answered.    Pertinent Gynecological History: Menses: with severe dysmenorrhea Contraception: OCP (estrogen/progesterone) DES exposure: denies Blood transfusions: none Sexually transmitted diseases: no past history Previous GYN Procedures: laparoscopy  Last mammogram: n/a  Last pap: normal Date: 8/18 OB History: G0, P0   Menstrual History: Patient's last menstrual period was 07/31/2017.    Past Medical History:  Diagnosis Date  . Abnormal Pap smear of cervix    2008 or 2009  . Anxiety   . Dental bridge present    lower front  . Dry eyes   . Endometriosis   . Headache    Migraines  . History of indigestion    treat with OTC  . Hypoglycemia   . Irritable bowel syndrome (IBS)   . Seasonal allergies   . Trigger thumb of right hand 11/2013    Past Surgical History:  Procedure Laterality Date  . CHOLECYSTECTOMY  06/2015  . CHROMOPERTUBATION N/A 05/30/2016   Procedure: CHROMOPERTUBATION;  Surgeon: Megan Salon, MD;  Location: [facility];  Service: Gynecology;  Laterality: N/A;  . COLPOSCOPY  2008  .  LAPAROSCOPY N/A 05/30/2016   Procedure: LAPAROSCOPY OPERATIVE WITH LYSIS OF ADHESIONS, EXCISION OF POSSIBLE ENDOMETRIOSIS;  Surgeon: Megan Salon, MD;  Location: [facility];  Service: Gynecology;  Laterality: N/A;  . REFRACTIVE SURGERY Bilateral 02/2015  . TONSILLECTOMY  1988  . TRIGGER FINGER RELEASE Right 12/23/2013   Procedure: RIGHT THUMB TRIGGER RELEASE ;  Surgeon: Tennis Must, MD;  Location: [facility];  Service: Orthopedics;  Laterality: Right;    Family History  Problem Relation Age of Onset  . Breast cancer Maternal Grandmother 28       died early 37's  . Hyperlipidemia Mother   . Heart disease Father   . Cancer Paternal Grandfather     Social History:  reports that she quit smoking about 7 years ago. she has never used smokeless tobacco. She reports that she drinks about 1.8 - 2.4 oz of alcohol per week. She reports that she does not use drugs.  Allergies: No Known Allergies  Medications Prior to Admission  Medication Sig Dispense Refill Last Dose  . ALPRAZolam (XANAX) 0.25 MG tablet Take 1 tablet (0.25 mg total) by mouth at bedtime as needed for anxiety. 30 tablet 0 08/20/2017 at Unknown time  . Carboxymethylcellulose Sod PF (REFRESH PLUS) 0.5 % SOLN Place 1 drop into both eyes 2 (two) times daily.   08/21/2017 at Unknown time  . Cholecalciferol (VITAMIN D3) 2000 units TABS Take 2,000 Units by mouth at bedtime.   Past Week at  Unknown time  . ibuprofen (ADVIL,MOTRIN) 800 MG tablet Take 1 tablet (800 mg total) by mouth every 8 (eight) hours as needed for mild pain, moderate pain or cramping. 30 tablet 0 Past Month at Unknown time  . montelukast (SINGULAIR) 10 MG tablet Take 10 mg by mouth at bedtime.   08/20/2017 at Unknown time  . Multiple Vitamin (MULTIVITAMIN WITH MINERALS) TABS tablet Take 1 tablet by mouth at bedtime.   Past Month at Unknown time  . Simethicone (GAS-X EXTRA STRENGTH) 125 MG CAPS Take 125 mg by mouth at bedtime.   08/20/2017 at Unknown time  .  valACYclovir (VALTREX) 1000 MG tablet TAKE 1 TABLET (1,000 MG TOTAL) BY MOUTH DAILY. (Patient taking differently: Take 1,000 mg by mouth at bedtime. ) 90 tablet 4 08/20/2017 at Unknown time  . cyclobenzaprine (FLEXERIL) 5 MG tablet Take 1 tablet (5 mg total) by mouth 3 (three) times daily as needed for muscle spasms. 60 tablet 0 More than a month at Unknown time  . fluticasone (FLONASE) 50 MCG/ACT nasal spray Place 2 sprays into both nostrils daily as needed for allergies.    More than a month at Unknown time  . naproxen sodium (ALEVE) 220 MG tablet Take 220 mg by mouth 3 (three) times daily as needed (for pain.).   More than a month at Unknown time  . oxyCODONE-acetaminophen (PERCOCET/ROXICET) 5-325 MG tablet Take 1-2 tablets by mouth every 6 (six) hours as needed for severe pain. 30 tablet 0   . traMADol (ULTRAM) 50 MG tablet 50-100mg  every six hours.  400mg  max in 24 hours. (Patient taking differently: Take 50-100 mg by mouth every 6 (six) hours as needed (for pain. (Max 400 mg/24 hrs.)). ) 30 tablet 0 More than a month at Unknown time    Review of Systems  All other systems reviewed and are negative.   Blood pressure (!) 127/96, pulse 85, temperature 98.6 F (37 C), temperature source Oral, resp. rate 18, last menstrual period 07/31/2017, SpO2 99 %. Physical Exam  Constitutional: She is oriented to person, place, and time. She appears well-developed and well-nourished.  Cardiovascular: Normal rate and regular rhythm.  Respiratory: Effort normal and breath sounds normal.  GI: Soft. Bowel sounds are normal.  Neurological: She is alert and oriented to person, place, and time.  Skin: Skin is warm and dry.  Psychiatric: She has a normal mood and affect.    Results for orders placed or performed during the hospital encounter of 08/21/17 (from the past 24 hour(s))  hCG, serum, qualitative     Status: None   Collection Time: 08/21/17  8:55 AM  Result Value Ref Range   Preg, Serum NEGATIVE  NEGATIVE    No results found.  Assessment/Plan: 36 yo G0 MWF with endometriosis, chronic pelvic pain, severe dysmenorrhea here for TLH/bilateral salpingectomy/cystoscopy.  As well, she has asymmetric labia and will have reduction on the right side done today.  Questions answered.  Pt ready to proceed.    Megan Salon 08/21/2017, 9:38 AM

## 2017-08-21 NOTE — Discharge Instructions (Signed)
Post Op Hysterectomy Instructions Please read the instructions below. Refer to these instructions for the next few weeks. These instructions provide you with general information on caring for yourself after surgery. Your caregiver may also give you specific instructions. While your treatment has been planned according to the most current medical practices available, unavoidable problems sometimes happen. If you have any problems or questions after you leave, please call your caregiver.  HOME CARE INSTRUCTIONS Healing will take time. You will have discomfort, tenderness, swelling and bruising at the operative site for a couple of weeks. This is normal and will get better as time goes on.  Only take over-the-counter or prescription medicines for pain, discomfort or fever as directed by your caregiver.  Do not take aspirin. It can cause bleeding.  Do not drive when taking pain medication.  Follow your caregiver's advice regarding diet, exercise, lifting, driving and general activities.  Resume your usual diet as directed and allowed.  Get plenty of rest and sleep.  Do not douche, use tampons, or have sexual intercourse until your caregiver gives you permission. .  Take your temperature if you feel hot or flushed.  You may shower today when you get home.  No tub bath for one week.   Do not drink alcohol until you are not taking any narcotic pain medications.  Try to have someone home with you for a week or two to help with the household activities.   Be careful over the next two to three weeks with any activities at home that involve lifting, pushing, or pulling.  Listen to your body--if something feels uncomfortable to do, then don't do it. Make sure you and your family understands everything about your operation and recovery.  Walking up stairs is fine. Do not sign any legal documents until you feel normal again.  Keep all your follow-up appointments as recommended by your caregiver.   PLEASE CALL  THE OFFICE IF: There is swelling, redness or increasing pain in the wound area.  Pus is coming from the wound.  You notice a bad smell from the wound or surgical dressing.  You have pain, redness and swelling from the intravenous site.  The wound is breaking open (the edges are not staying together).   You develop pain or bleeding when you urinate.  You develop abnormal vaginal discharge.  You have any type of abnormal reaction or develop an allergy to your medication.  You need stronger pain medication for your pain   SEEK IMMEDIATE MEDICAL CARE: You develop a temperature of 100.5 or higher.  You develop abdominal pain.  You develop chest pain.  You develop shortness of breath.  You pass out.  You develop pain, swelling or redness of your leg.  You develop heavy vaginal bleeding with or without blood clots.   MEDICATIONS: Restart your regular medications BUT wait one week before restarting all vitamins and mineral supplements Use Motrin 800mg every 8 hours for the next several days.  This will help you use less Percocet.  Use the Percocet 5/325 1-2 tabs every 4-6 hours as needed for pain. You may use an over the counter stool softener like Colace or Dulcolax to help with starting a bowel movement.  Start the day after you go home.  Warm liquids, fluids, and ambulation help too.  If you have not had a bowel movement in four days, you need to call the office.    too.  If you have not had a bowel movement in four days, you need to call the office.  If you have any questions or concerns, call me at [phone].

## 2017-08-21 NOTE — Progress Notes (Signed)
Day of Surgery Procedure(s) (LRB): TOTAL LAPAROSCOPIC HYSTERECTOMY WITH SALPINGECTOMY (Bilateral) CYSTOSCOPY (N/A) LABIAPLASTY (N/A)  Subjective: Patient reports excellent pain control.  Has only gotten toradol for pain.  No nausea.  Has eaten, walked, and voided.  Would like to go home.  Burping but no flatus yet.  Pt would like to go home.  Objective: I have reviewed patient's vital signs, intake and output, medications and labs. Vitals:   08/21/17 1354 08/21/17 1604  BP: 120/81 119/80  Pulse: (!) 106 (!) 111  Resp: 16 16  Temp: 98.2 F (36.8 C) 97.7 F (36.5 C)  SpO2: 98% 100%   Hb:  13.0 drawn at 1754  General: alert and cooperative Resp: clear to auscultation bilaterally Cardio: regular rate and rhythm, S1, S2 normal, no murmur, click, rub or gallop GI: soft, non-tender; bowel sounds normal; no masses,  no organomegaly and incision: clean, dry and intact Extremities: extremities normal, atraumatic, no cyanosis or edema Vaginal Bleeding: minimal  Assessment: s/p Procedure(s): TOTAL LAPAROSCOPIC HYSTERECTOMY WITH SALPINGECTOMY (Bilateral) CYSTOSCOPY (N/A) LABIAPLASTY (N/A): stable and progressing well  Plan: Discharge home  LOS: 0 days    Megan Salon 08/21/2017, 6:37 PM

## 2017-08-21 NOTE — Discharge Summary (Signed)
Physician Discharge Summary  Patient ID: [name] MRN: [id number] DOB/AGE: 04-28-1981 36 y.o.  Admit date: 08/21/2017 Discharge date: 08/21/2017  Admission Diagnoses: chronic pelvic pain, endometriosis, dysmenorrhea, asymmetric labia  Discharge Diagnoses:  Active Problems:   Endometriosis  Discharged Condition: stable  Hospital Course: Patient admitted through same day surgery.  She was taken to OR where TLH/bilateral salpingectomy/cystoscopy, right labia reduction were performed.  Surgical findings included endometriosis along left sidewall.  Surgery was uneventful.  EBL 50cc.  Foley catheter was removed before leaving OR.  Patient transferred to PACU where she was stable and then to 3rd floor for the remainder of her hospitalization.  During her post-op recovery, her vitals and stable and she was AF.  In evening of POD#0, she was able to transition to oral pain medications and regular diet.  She was able to ambulate and she had good pain control.  She was also able to void on her own.  Patient seen both in the evening of POD#0.  Post op hb was 13.0.  Pt desired discharge to home which I felt was appropriate.    Consults: None  Significant Diagnostic Studies: labs: post op hb 13.0  Treatments: surgery: TLH/bilateral salpingectomy/cystoscopy, right labial reduction  Discharge Exam: Blood pressure 119/80, pulse (!) 111, temperature 97.7 F (36.5 C), temperature source Oral, resp. rate 16, height 5\' 1"  (1.549 m), weight 127 lb (57.6 kg), last menstrual period 07/31/2017, SpO2 100 %. General appearance: alert, cooperative and no distress Resp: clear to auscultation bilaterally Cardio: regular rate and rhythm, S1, S2 normal, no murmur, click, rub or gallop GI: soft, non-tender; bowel sounds normal; no masses,  no organomegaly Extremities: extremities normal, atraumatic, no cyanosis or edema Incision/Wound:c/d/i  Disposition: 07-Left Against Medical Advice/Left Without Being  Seen/Elopement   Allergies as of 08/21/2017   No Known Allergies     Medication List    STOP taking these medications   naproxen sodium 220 MG tablet Commonly known as:  ALEVE   traMADol 50 MG tablet Commonly known as:  ULTRAM     TAKE these medications   ALPRAZolam 0.25 MG tablet Commonly known as:  XANAX Take 1 tablet (0.25 mg total) by mouth at bedtime as needed for anxiety.   cyclobenzaprine 5 MG tablet Commonly known as:  FLEXERIL Take 1 tablet (5 mg total) by mouth 3 (three) times daily as needed for muscle spasms.   fluticasone 50 MCG/ACT nasal spray Commonly known as:  FLONASE Place 2 sprays into both nostrils daily as needed for allergies.   GAS-X EXTRA STRENGTH 125 MG Caps Generic drug:  Simethicone Take 125 mg by mouth at bedtime.   ibuprofen 800 MG tablet Commonly known as:  ADVIL,MOTRIN Take 1 tablet (800 mg total) by mouth every 8 (eight) hours as needed for mild pain, moderate pain or cramping.   montelukast 10 MG tablet Commonly known as:  SINGULAIR Take 10 mg by mouth at bedtime.   multivitamin with minerals Tabs tablet Take 1 tablet by mouth at bedtime.   oxyCODONE-acetaminophen 5-325 MG tablet Commonly known as:  PERCOCET/ROXICET Take 1-2 tablets by mouth every 6 (six) hours as needed for severe pain.   REFRESH PLUS 0.5 % Soln Generic drug:  Carboxymethylcellulose Sod PF Place 1 drop into both eyes 2 (two) times daily.   valACYclovir 1000 MG tablet Commonly known as:  VALTREX TAKE 1 TABLET (1,000 MG TOTAL) BY MOUTH DAILY. What changed:  when to take this   Vitamin D3 2000 units Tabs Take  2,000 Units by mouth at bedtime.      Follow-up Information    Megan Salon, MD On 08/28/2017.   Specialty:  Gynecology Why:  pt already has appt time Contact information: [address] [place] [address] [phone]           Signed: Megan Salon 08/21/2017, 6:42 PM

## 2017-08-21 NOTE — Anesthesia Postprocedure Evaluation (Signed)
Anesthesia Post Note  Patient: [name]  Procedure(s) Performed: TOTAL LAPAROSCOPIC HYSTERECTOMY WITH SALPINGECTOMY (Bilateral Abdomen) CYSTOSCOPY (N/A Urethra) LABIAPLASTY (N/A Perineum)     Patient location during evaluation: PACU Anesthesia Type: General Level of consciousness: awake and alert Pain management: pain level controlled Vital Signs Assessment: post-procedure vital signs reviewed and stable Respiratory status: spontaneous breathing, nonlabored ventilation, respiratory function stable and patient connected to nasal cannula oxygen Cardiovascular status: blood pressure returned to baseline and stable Postop Assessment: no apparent nausea or vomiting Anesthetic complications: no    Last Vitals:  Vitals:   08/21/17 1330 08/21/17 1354  BP: 113/81 120/81  Pulse: 99 (!) 106  Resp: 15 16  Temp:  36.8 C  SpO2: 100% 98%    Last Pain:  Vitals:   08/21/17 1354  TempSrc:   PainSc: 2    Pain Goal: Patients Stated Pain Goal: 3 (08/21/17 0855)               Tiajuana Amass

## 2017-08-21 NOTE — Anesthesia Preprocedure Evaluation (Signed)
Anesthesia Evaluation  Patient identified by MRN, date of birth, ID band Patient awake    Reviewed: Allergy & Precautions, NPO status , Patient's Chart, lab work & pertinent test results  Airway Mallampati: II  TM Distance: >3 FB Neck ROM: Full    Dental  (+) Dental Advisory Given   Pulmonary former smoker,    breath sounds clear to auscultation       Cardiovascular negative cardio ROS   Rhythm:Regular Rate:Normal     Neuro/Psych negative neurological ROS     GI/Hepatic negative GI ROS, Neg liver ROS,   Endo/Other  negative endocrine ROS  Renal/GU negative Renal ROS     Musculoskeletal   Abdominal   Peds  Hematology negative hematology ROS (+)   Anesthesia Other Findings   Reproductive/Obstetrics endometriosis                             Lab Results  Component Value Date   WBC 7.9 08/08/2017   HGB 12.7 08/08/2017   HCT 37.6 08/08/2017   MCV 93.8 08/08/2017   PLT 261 08/08/2017   Lab Results  Component Value Date   CREATININE 0.62 04/29/2016   BUN 11 04/29/2016   NA 137 04/29/2016   K 4.6 04/29/2016   CL 99 04/29/2016   CO2 28 04/29/2016  '  Anesthesia Physical Anesthesia Plan  ASA: I  Anesthesia Plan: General   Post-op Pain Management:    Induction:   PONV Risk Score and Plan: 4 or greater and Scopolamine patch - Pre-op, Midazolam, Dexamethasone, Ondansetron and Treatment may vary due to age or medical condition  Airway Management Planned: Oral ETT  Additional Equipment:   Intra-op Plan:   Post-operative Plan: Extubation in OR  Informed Consent: I have reviewed the patients History and Physical, chart, labs and discussed the procedure including the risks, benefits and alternatives for the proposed anesthesia with the patient or authorized representative who has indicated his/her understanding and acceptance.   Dental advisory given  Plan Discussed with:  CRNA  Anesthesia Plan Comments:         Anesthesia Quick Evaluation

## 2017-08-21 NOTE — Anesthesia Postprocedure Evaluation (Signed)
Anesthesia Post Note  Patient: [name]  Procedure(s) Performed: TOTAL LAPAROSCOPIC HYSTERECTOMY WITH SALPINGECTOMY (Bilateral Abdomen) CYSTOSCOPY (N/A Urethra) LABIAPLASTY (N/A Perineum)     Patient location during evaluation: [facility] Anesthesia Type: General Level of consciousness: awake and alert and oriented Pain management: pain level controlled Vital Signs Assessment: post-procedure vital signs reviewed and stable Respiratory status: spontaneous breathing, nonlabored ventilation and respiratory function stable Cardiovascular status: stable Postop Assessment: adequate PO intake and no apparent nausea or vomiting Anesthetic complications: no    Last Vitals:  Vitals:   08/21/17 1354 08/21/17 1604  BP: 120/81 119/80  Pulse: (!) 106 (!) 111  Resp: 16 16  Temp: 36.8 C 36.5 C  SpO2: 98% 100%    Last Pain:  Vitals:   08/21/17 1604  TempSrc: Oral  PainSc:    Pain Goal: Patients Stated Pain Goal: 3 (08/21/17 0855)               Willa Rough

## 2017-08-22 ENCOUNTER — Encounter (HOSPITAL_COMMUNITY): Payer: Self-pay | Admitting: Obstetrics & Gynecology

## 2017-08-25 ENCOUNTER — Other Ambulatory Visit: Payer: Self-pay

## 2017-08-25 ENCOUNTER — Ambulatory Visit (INDEPENDENT_AMBULATORY_CARE_PROVIDER_SITE_OTHER): Payer: BLUE CROSS/BLUE SHIELD | Admitting: Obstetrics & Gynecology

## 2017-08-25 ENCOUNTER — Encounter: Payer: Self-pay | Admitting: Obstetrics & Gynecology

## 2017-08-25 ENCOUNTER — Telehealth: Payer: Self-pay | Admitting: Obstetrics & Gynecology

## 2017-08-25 VITALS — BP 100/78 | HR 80 | Resp 18 | Ht 61.0 in | Wt 127.0 lb

## 2017-08-25 DIAGNOSIS — T7840XA Allergy, unspecified, initial encounter: Secondary | ICD-10-CM

## 2017-08-25 DIAGNOSIS — Z9889 Other specified postprocedural states: Secondary | ICD-10-CM

## 2017-08-25 NOTE — Progress Notes (Signed)
Post Operative Visit  Procedure: Total Laparoscopic hysterectomy with salpingectomy, cystoscopy, Labiaplasty.  Days Post-op: 4   Subjective: Pt called today reporting some drainage from incision at umbilicus, so here for evaluation.  Doing well, otherwise.  Voiding.  Having bowel movements.  Using Motrin only.  No fevers.  Pathology reviewed.  No vaginal bleeding or discharge.  Objective: BP 100/78 (BP Location: Right Arm, Patient Position: Sitting, Cuff Size: Normal)   Pulse 80   Resp 18   Ht 5\' 1"  (1.549 m)   Wt 127 lb (57.6 kg)   LMP 07/31/2017   BMI 24.00 kg/m   EXAM General: alert, cooperative and no distress Resp: clear to auscultation bilaterally Cardio: regular rate and rhythm, S1, S2 normal, no murmur, click, rub or gallop GI: soft, non-tender; bowel sounds normal; no masses,  no organomegaly and incision: clean, dry, intact and but erythematous and blisters present beneath demabond at all inciisons.  No other evidence of skin changes except at incisions where dermabond was placed. Extremities: extremities normal, atraumatic, no cyanosis or edema Vaginal Bleeding: none  Assessment: s/p TLH/bilateral salpingectomy, cystoscopy, resection of endometriosis on left sidewall.    Plan: Likely allergic reaction to derma bond present.  Benadryl orally and topically recommended.  Will recheck in 3 days.  Pt knows to call if anything worsens

## 2017-08-25 NOTE — Telephone Encounter (Signed)
Message   ----- Message from [place], Generic sent at 08/25/2017 6:38 AM EST -----    Hi Dr Sabra Heck,  Im doing alright, still moving a little slow. I wanted to ask you a question going into the weekend. I know you said my bellybutton incision would bother me the most. Earlier in the week it was leaking a little clear fluid, no odor. Yesterday and this morning its has a blood tinge and seems sticky, but still no odor. Not a ton but enough to leave a lot of little spots on the underside of my shirt. Also, everything is sooooo itchy. I assume its just everything healing I just dont remember being this itchy with previous surgeries. I couldnt find your number or I would have texted. Let me know your thoughts. Thanks and hope youre having a great week.    [name]

## 2017-08-25 NOTE — Telephone Encounter (Signed)
Left message to call Yaiza Palazzola at [phone].  

## 2017-08-25 NOTE — Telephone Encounter (Signed)
Spoke with patient. S/p TLH on 08/21/17.  Reports blood tinged drainage from umbilical incision. States area around incision is "a little pink", unsure if this is stained from blood or inflammation. No warmth at incision site. Reports increased itching.   Denies odor, fever/chills, N/V.   Recommended OV for further evaluation. OV scheduled for today at 2pm with Dr. Sabra Heck. Patient verbalizes understanding and is agreeable.

## 2017-08-28 ENCOUNTER — Ambulatory Visit (INDEPENDENT_AMBULATORY_CARE_PROVIDER_SITE_OTHER): Payer: BLUE CROSS/BLUE SHIELD | Admitting: Obstetrics & Gynecology

## 2017-08-28 ENCOUNTER — Encounter: Payer: Self-pay | Admitting: Obstetrics & Gynecology

## 2017-08-28 VITALS — BP 110/60 | HR 84 | Resp 16 | Ht 61.0 in | Wt 127.0 lb

## 2017-08-28 DIAGNOSIS — Z9889 Other specified postprocedural states: Secondary | ICD-10-CM

## 2017-08-28 NOTE — Progress Notes (Signed)
Post Operative Visit  Procedure: Total Laparoscopic hysterectomy with Salpingectomy, cystoscopy, Labiaplasty.  Days Post-op: 8  Subjective: Doing well.  Taking motrin only.  Having bowel movements.  Urinary function feels normal.  No vaginal bleeding.  No vaginal discharge.  Itching at incisions due to reaction to Dermabond is improved.  Objective: BP 110/60 (BP Location: Right Arm, Patient Position: Sitting, Cuff Size: Normal)   Pulse 84   Resp 16   Ht 5\' 1"  (1.549 m)   Wt 127 lb (57.6 kg)   LMP 07/31/2017   BMI 24.00 kg/m   EXAM General: alert and cooperative Resp: clear to auscultation bilaterally Cardio: regular rate and rhythm, S1, S2 normal, no murmur, click, rub or gallop GI: soft, non-tender; bowel sounds normal; no masses,  no organomegaly and incision: improved erythema and blistering Extremities: extremities normal, atraumatic, no cyanosis or edema Vaginal Bleeding: none  Gyn:  NAEFG, vagina without lesions, cuff well approximated, no masses  Assessment: s/p TLH/bilateral salpingectomy/cystoscopy, resection of endometriosis  Plan: Recheck 5 weeks Pt knows to call with any questions or concerns.

## 2017-09-05 ENCOUNTER — Encounter: Payer: Self-pay | Admitting: Obstetrics & Gynecology

## 2017-09-05 ENCOUNTER — Telehealth: Payer: Self-pay | Admitting: Obstetrics & Gynecology

## 2017-09-05 NOTE — Telephone Encounter (Signed)
Routing to [place] for review and advise. Picture are attached to patient's MyChart in Epic for review as well.

## 2017-09-05 NOTE — Telephone Encounter (Signed)
Message   ----- Message from [place], Generic sent at 09/05/2017 9:37 AM EST -----    Hi Dr Sabra Heck. Since having the allergic reaction to the Dermabond I have been keeping a close eye on my incisions. The remainder of the glue came off about 2 days ago. My left incision has had small hives around it for a few days, even after the glue came off. This morning the the right side has larger hives. I have been using Benadryl cream which helps a little with the itching. I have not been using the oral Benadryl. I will try taking it tonight and see if that helps. Any other suggestions. I have attached pictures. First is of the left side and second is of the right side.  Thank you!  Marga Hoots

## 2017-09-06 NOTE — Telephone Encounter (Signed)
Spoke with patient. Appointment scheduled for tomorrow 09/07/2017 at 3:30 pm with Dr.Miller. Encounter closed.

## 2017-09-06 NOTE — Telephone Encounter (Signed)
I think this looks ok but please see if I can just do a recheck tomorrow to make sure everything is healing ok.

## 2017-09-07 ENCOUNTER — Ambulatory Visit (INDEPENDENT_AMBULATORY_CARE_PROVIDER_SITE_OTHER): Payer: BLUE CROSS/BLUE SHIELD | Admitting: Obstetrics & Gynecology

## 2017-09-07 VITALS — BP 102/70 | HR 76 | Resp 16 | Ht 61.0 in | Wt 125.0 lb

## 2017-09-07 DIAGNOSIS — Z9889 Other specified postprocedural states: Secondary | ICD-10-CM

## 2017-09-07 NOTE — Progress Notes (Signed)
Post Operative Visit  Procedure: Total Laparoscopic hysterectomy with salpingectomy, cystoscopy, Labiaplasty.  Days Post-op: 18  Subjective: Pt sent mychart message about incisions and is here for recheck today.  Had reaction/allergy to dermabond.  Has been taking Benadryl and using topical benadryl but has a little flare of symptoms on Monday and Tuesday.  Is better now.  No drainage.  Abdominal bloating is improved.  Taking motrin intermittently only.  No vaginal bleeding.  Normal bowel and bladder function.  Objective: BP 102/70 (BP Location: Right Arm, Patient Position: Sitting, Cuff Size: Normal)   Pulse 76   Resp 16   Ht 5\' 1"  (1.549 m)   Wt 125 lb (56.7 kg)   LMP 07/31/2017   BMI 23.62 kg/m   EXAM General: alert and cooperative GI: soft, non-tender; bowel sounds normal; no masses,  no organomegaly and incision: clean, dry and intact Extremities: extremities normal, atraumatic, no cyanosis or edema Vaginal Bleeding: none  Assessment: s/p TLH/bilateral salpingectomy/cystoscopy  Plan: Recheck 4 weeks Pt reassured.  Can now start using Mederma.

## 2017-09-10 ENCOUNTER — Encounter: Payer: Self-pay | Admitting: Obstetrics & Gynecology

## 2017-09-22 DIAGNOSIS — D225 Melanocytic nevi of trunk: Secondary | ICD-10-CM | POA: Diagnosis not present

## 2017-09-22 DIAGNOSIS — L814 Other melanin hyperpigmentation: Secondary | ICD-10-CM | POA: Diagnosis not present

## 2017-09-22 DIAGNOSIS — B353 Tinea pedis: Secondary | ICD-10-CM | POA: Diagnosis not present

## 2017-09-22 DIAGNOSIS — L218 Other seborrheic dermatitis: Secondary | ICD-10-CM | POA: Diagnosis not present

## 2017-09-29 ENCOUNTER — Ambulatory Visit (INDEPENDENT_AMBULATORY_CARE_PROVIDER_SITE_OTHER): Payer: BLUE CROSS/BLUE SHIELD | Admitting: Obstetrics & Gynecology

## 2017-09-29 ENCOUNTER — Other Ambulatory Visit: Payer: Self-pay

## 2017-09-29 ENCOUNTER — Encounter: Payer: Self-pay | Admitting: Obstetrics & Gynecology

## 2017-09-29 VITALS — BP 110/68 | HR 74 | Resp 14 | Ht 61.0 in | Wt 125.0 lb

## 2017-09-29 DIAGNOSIS — Z9889 Other specified postprocedural states: Secondary | ICD-10-CM

## 2017-09-29 DIAGNOSIS — R5383 Other fatigue: Secondary | ICD-10-CM

## 2017-09-29 NOTE — Progress Notes (Signed)
Post Operative Visit  Procedure: Total Laparoscopic hysterectomy with salpingectomy, cystoscopy, Labiaplasty Days Post-op: 38 days   Subjective: Doing well.  Denies vaginal bleeding.  Energy is still really poor.  Felt like she overdid it this weekend and then felt sort of crummy for several days.  Feels like she is not ready to go back to work.  Is very concerned she will not have enough energy to get to the work day.  There is no light duty at her work.  This postponed schedule on Monday.  Bladder function is normal.  Bowel function is normal.  Denies fevers, vaginal bleeding, vaginal discharge.  Objective: BP 110/68 (BP Location: Right Arm, Patient Position: Sitting, Cuff Size: Normal)   Pulse 74   Resp 14   Ht 5\' 1"  (1.549 m)   Wt 125 lb (56.7 kg)   LMP 07/31/2017   BMI 23.62 kg/m   EXAM General: alert and cooperative Resp: clear to auscultation bilaterally Cardio: regular rate and rhythm, S1, S2 normal, no murmur, click, rub or gallop GI: soft, non-tender; bowel sounds normal; no masses,  no organomegaly and incision: clean, dry and intact Extremities: extremities normal, atraumatic, no cyanosis or edema Vaginal Bleeding: none  GYN: Normal-appearing external female genitalia, vagina pink and moist without lesions or discharge, cuff healing well without masses or fullness and is nontender, no pelvic masses  Assessment: s/p TLH, bilateral salpingectomy, resection of endometriosis, cystoscopy due to pelvic pain and endometriosis  Plan: Advised patient I would do paperwork for her to be off work for 2 more weeks.  She is getting immune update at that time.  She is aware she needs pelvic rest for a 4 weeks. If she returns to work normally and without any other issues, I will plan to see her her yearly exam.

## 2017-10-11 ENCOUNTER — Telehealth: Payer: Self-pay | Admitting: Obstetrics & Gynecology

## 2017-10-11 NOTE — Telephone Encounter (Signed)
Letter signed and back to you for pt to pick up.

## 2017-10-11 NOTE — Telephone Encounter (Signed)
Letter to the front for patient pick up. Patient is aware. Encounter closed.

## 2017-10-11 NOTE — Telephone Encounter (Signed)
Patient called and requested a letter releasing her to work starting on Monday, 10/16/17. She said she will come by and pick it up once it is ready.

## 2017-10-11 NOTE — Telephone Encounter (Signed)
Letter written and to [place] for review.

## 2018-01-23 DIAGNOSIS — Z130 Encounter for screening for diseases of the blood and blood-forming organs and certain disorders involving the immune mechanism: Secondary | ICD-10-CM | POA: Diagnosis not present

## 2018-01-23 DIAGNOSIS — Z13220 Encounter for screening for lipoid disorders: Secondary | ICD-10-CM | POA: Diagnosis not present

## 2018-01-23 DIAGNOSIS — Z0000 Encounter for general adult medical examination without abnormal findings: Secondary | ICD-10-CM | POA: Diagnosis not present

## 2018-03-28 ENCOUNTER — Ambulatory Visit: Payer: BLUE CROSS/BLUE SHIELD | Admitting: Certified Nurse Midwife

## 2018-07-05 ENCOUNTER — Ambulatory Visit: Payer: BLUE CROSS/BLUE SHIELD | Admitting: Obstetrics & Gynecology

## 2018-07-05 ENCOUNTER — Encounter

## 2018-07-17 ENCOUNTER — Other Ambulatory Visit: Payer: Self-pay

## 2018-07-17 ENCOUNTER — Telehealth: Payer: Self-pay | Admitting: *Deleted

## 2018-07-17 ENCOUNTER — Ambulatory Visit: Payer: BLUE CROSS/BLUE SHIELD | Admitting: Certified Nurse Midwife

## 2018-07-17 ENCOUNTER — Encounter: Payer: Self-pay | Admitting: Certified Nurse Midwife

## 2018-07-17 VITALS — BP 110/70 | HR 64 | Temp 98.2°F | Resp 16 | Wt 122.0 lb

## 2018-07-17 DIAGNOSIS — N390 Urinary tract infection, site not specified: Secondary | ICD-10-CM

## 2018-07-17 DIAGNOSIS — R319 Hematuria, unspecified: Secondary | ICD-10-CM

## 2018-07-17 MED ORDER — NITROFURANTOIN MONOHYD MACRO 100 MG PO CAPS: ORAL_CAPSULE | ORAL | 0 refills | Status: DC

## 2018-07-17 NOTE — Telephone Encounter (Signed)
Return call to patient.  Dysuria x 2 days. No fevers or flank pain. Using AZO due to symptoms.  Office visit today with Melvia Heaps CNM scheduled.  Encounter closed.

## 2018-07-17 NOTE — Patient Instructions (Signed)

## 2018-07-17 NOTE — Telephone Encounter (Signed)
Patient would like to come in today for uti symptoms. Can nurse please assist with scheduling? Thank you

## 2018-07-17 NOTE — Progress Notes (Signed)
37 y.o. Married Caucasian female G0P0000 here with complaint of UTI, with onset  on 3-4 days ago with slight back pain. . Patient complaining of urinary frequency/urgency/ and pain with urination and hematuria last pm. She started on Azo OTC last pm and helped with pain and back discomfort.. Patient denies fever, chills, nausea or back pain. No new personal products. Patient feels not probably related to sexual activity. Denies any vaginal symptoms.    Contraception is hysterectomy. Patient trying to consume fluids and working in [place] today. Back pain feels much better today. No other health issues today.  Review of Systems  Constitutional: Negative.   HENT: Negative.   Eyes: Negative.   Respiratory: Negative.   Cardiovascular: Negative.   Gastrointestinal: Negative.   Genitourinary: Positive for dysuria, frequency and urgency.       Blood in urine, side discomfort  Musculoskeletal: Negative.   Skin: Negative.   Neurological: Negative.   Endo/Heme/Allergies: Negative.   Psychiatric/Behavioral: Negative.     O: Healthy female WDWN Affect: Normal, orientation x 3 Skin : warm and dry CVAT:slightly positive on right only  Abdomen: postive for suprapubic tenderness  Pelvic exam: External genital area: normal, no lesions Bladder,Urethra tender, Urethral meatus: tender, red Vagina: normal vaginal discharge, normal appearance   Cervix:/Uterus/: surgically absent.  Adnexa: non tender, no fullness  A: UTI with hematuria Normal pelvic exam Unable to dip urine due to azo  P: Reviewed findings of UTI and need for treatment. Possible crystal or stone with hematuria description. Rx: Macrobid see order with instructions DTO:IZTIW micro, culture Reviewed warning signs and symptoms of UTI/stones/crystals and need to advise if occurring. Stop Azo after 24-48 hours and if not improving needs to call. Encouraged to limit soda, tea, and coffee and be sure to increase water intake. Discussed starting on  Cranberry capsules for prevention.   RV prn

## 2018-07-17 NOTE — Telephone Encounter (Signed)
Message left to return call to Kaylani Fromme at [phone].    

## 2018-07-18 LAB — URINALYSIS, MICROSCOPIC ONLY: Casts: NONE SEEN /lpf

## 2018-07-19 LAB — URINE CULTURE

## 2018-07-23 ENCOUNTER — Telehealth: Payer: Self-pay | Admitting: Certified Nurse Midwife

## 2018-07-23 NOTE — Telephone Encounter (Signed)
Left detailed message, name identified on voicemail, ok per dpr. Advised as seen below per Melvia Heaps, CNM. Return call to office if any additional questions.   Encounter closed.

## 2018-07-23 NOTE — Telephone Encounter (Signed)
Patient called to see if the urine culture results are ready for her. She said she is still experiencing some tenderness and is curious about the results.

## 2018-07-23 NOTE — Telephone Encounter (Signed)
Spoke with patient. Patient seen in office on 07/17/18 for urinary symptoms, started on Macrobid 100mg  bid. Patient has 2 doses left, feels better than last week, still feeling "tender when urinating, not quite normal". Denies any other symptoms. Requesting urine culture results.  Advised Melvia Heaps, CNM is out of the office today. UC showed E coli, treating with appropriate abx. Advised provider needs to review final results, instructed to complete abx. I will review with covering provider and return call with any additional recommendations. Patient agreeable.   Routing to Dr. Quincy Simmonds to review.   Cc: Melvia Heaps, CNM

## 2018-07-23 NOTE — Telephone Encounter (Signed)
Reviewed culture and feel medication adequate. Make sure she is drinking enough water to flush out bacteria. If not resolved by completion of medication needs recheck urine in office

## 2018-07-23 NOTE — Telephone Encounter (Signed)
Patient left message on machine over the weekend requesting lab results.

## 2018-08-11 ENCOUNTER — Other Ambulatory Visit: Payer: Self-pay | Admitting: Obstetrics & Gynecology

## 2018-08-13 NOTE — Telephone Encounter (Signed)
Medication refill request: Valtrex 1gm #90, 4R Last AEX:  03-24-17 Next AEX: 09-28-18 Last MMG (if hormonal medication request): 08-01-16 Neg/density C/BiRads1 Refill authorized: Please advise

## 2018-09-04 DIAGNOSIS — D225 Melanocytic nevi of trunk: Secondary | ICD-10-CM | POA: Diagnosis not present

## 2018-09-04 DIAGNOSIS — L814 Other melanin hyperpigmentation: Secondary | ICD-10-CM | POA: Diagnosis not present

## 2018-09-04 DIAGNOSIS — B351 Tinea unguium: Secondary | ICD-10-CM | POA: Diagnosis not present

## 2018-09-28 ENCOUNTER — Encounter: Payer: Self-pay | Admitting: Obstetrics & Gynecology

## 2018-09-28 ENCOUNTER — Ambulatory Visit (INDEPENDENT_AMBULATORY_CARE_PROVIDER_SITE_OTHER): Payer: BLUE CROSS/BLUE SHIELD | Admitting: Obstetrics & Gynecology

## 2018-09-28 ENCOUNTER — Other Ambulatory Visit: Payer: Self-pay

## 2018-09-28 VITALS — BP 100/60 | HR 72 | Resp 16 | Ht 61.25 in | Wt 121.8 lb

## 2018-09-28 DIAGNOSIS — E559 Vitamin D deficiency, unspecified: Secondary | ICD-10-CM

## 2018-09-28 DIAGNOSIS — R6882 Decreased libido: Secondary | ICD-10-CM | POA: Diagnosis not present

## 2018-09-28 DIAGNOSIS — Z01419 Encounter for gynecological examination (general) (routine) without abnormal findings: Secondary | ICD-10-CM | POA: Diagnosis not present

## 2018-09-28 DIAGNOSIS — R3915 Urgency of urination: Secondary | ICD-10-CM | POA: Diagnosis not present

## 2018-09-28 DIAGNOSIS — Z0000 Encounter for general adult medical examination without abnormal findings: Secondary | ICD-10-CM

## 2018-09-28 LAB — POCT URINALYSIS DIPSTICK
BILIRUBIN UA: NEGATIVE
Blood, UA: NEGATIVE
GLUCOSE UA: NEGATIVE
KETONES UA: NEGATIVE
Leukocytes, UA: NEGATIVE
Nitrite, UA: NEGATIVE
Protein, UA: NEGATIVE
UROBILINOGEN UA: 0.2 U/dL
pH, UA: 5 (ref 5.0–8.0)

## 2018-09-28 NOTE — Progress Notes (Signed)
38 y.o. G0P0000 Married White or Caucasian female here for annual exam.  Doing well.  December was really busy at work.  Reports she is feeling so much better.  Did ease back into work but this has been good.    Denies vaginal bleeding.    Having some increased issues with feeling like bladder much sooner than in the past.  When bladder is full, she does have increased pressure and discomfort.  This started in the summer.  Urine is not cloudy and does not have any odor.    Patient's last menstrual period was 07/31/2017.          Sexually active: Yes.    The current method of family planning is status post hysterectomy.    Exercising: Yes.    weights, cardio, walking Smoker:  no  Health Maintenance: Pap:  03/24/17 Neg. HR HPV:neg   03/23/16 ASCUS. HR HPV:neg  History of abnormal Pap:  yes MMG:  08/01/16 BIRADS1:neg. F/u age 15  TDaP:  2014 Screening Labs: PCP  UA: normal    reports that she quit smoking about 8 years ago. She has never used smokeless tobacco. She reports current alcohol use of about 3.0 - 4.0 standard drinks of alcohol per week. She reports that she does not use drugs.  Past Medical History:  Diagnosis Date  . Abnormal Pap smear of cervix    2008 or 2009  . Anxiety   . Dental bridge present    lower front  . Dry eyes   . Endometriosis   . Headache    Migraines  . History of indigestion    treat with OTC  . Hypoglycemia   . Irritable bowel syndrome (IBS)   . Seasonal allergies   . Trigger thumb of right hand 11/2013    Past Surgical History:  Procedure Laterality Date  . CHOLECYSTECTOMY  06/2015  . CHROMOPERTUBATION N/A 05/30/2016   Procedure: CHROMOPERTUBATION;  Surgeon: Megan Salon, MD;  Location: [facility];  Service: Gynecology;  Laterality: N/A;  . COLPOSCOPY  2008  . CYSTOSCOPY N/A 08/21/2017   Procedure: CYSTOSCOPY;  Surgeon: Megan Salon, MD;  Location: [facility];  Service: Gynecology;  Laterality: N/A;  . LABIOPLASTY N/A 08/21/2017   Procedure:  LABIAPLASTY;  Surgeon: Megan Salon, MD;  Location: [facility];  Service: Gynecology;  Laterality: N/A;  . LAPAROSCOPY N/A 05/30/2016   Procedure: LAPAROSCOPY OPERATIVE WITH LYSIS OF ADHESIONS, EXCISION OF POSSIBLE ENDOMETRIOSIS;  Surgeon: Megan Salon, MD;  Location: [facility];  Service: Gynecology;  Laterality: N/A;  . REFRACTIVE SURGERY Bilateral 02/2015  . TONSILLECTOMY  1988  . TOTAL LAPAROSCOPIC HYSTERECTOMY WITH SALPINGECTOMY Bilateral 08/21/2017   Procedure: TOTAL LAPAROSCOPIC HYSTERECTOMY WITH SALPINGECTOMY;  Surgeon: Megan Salon, MD;  Location: [facility];  Service: Gynecology;  Laterality: Bilateral;  . TRIGGER FINGER RELEASE Right 12/23/2013   Procedure: RIGHT THUMB TRIGGER RELEASE ;  Surgeon: Tennis Must, MD;  Location: [facility];  Service: Orthopedics;  Laterality: Right;    Current Outpatient Medications  Medication Sig Dispense Refill  . ALPRAZolam (XANAX) 0.25 MG tablet Take 1 tablet (0.25 mg total) by mouth at bedtime as needed for anxiety. 30 tablet 0  . Carboxymethylcellulose Sod PF (REFRESH PLUS) 0.5 % SOLN Place 1 drop into both eyes 2 (two) times daily.    . Cholecalciferol (VITAMIN D3) 2000 units TABS Take 2,000 Units by mouth at bedtime.    . Collagen 500 MG CAPS Take by mouth daily.    Marland Kitchen  eletriptan (RELPAX) 40 MG tablet 1 TABLET AS NEEDED ONE TIME ONCE A DAY AS NEEDED FOR HEADACHES ORALLY    . JUBLIA 10 % SOLN APPLY TOPICALLY TO THE AFFECTED AREA EVERY DAY    . montelukast (SINGULAIR) 10 MG tablet Take 10 mg by mouth at bedtime.    . Multiple Vitamin (MULTIVITAMIN WITH MINERALS) TABS tablet Take 1 tablet by mouth at bedtime.    . Simethicone (GAS-X EXTRA STRENGTH) 125 MG CAPS Take 125 mg by mouth at bedtime.    . valACYclovir (VALTREX) 1000 MG tablet TAKE 1 TABLET BY MOUTH EVERY DAY 90 tablet 4   No current facility-administered medications for this visit.     Family History  Problem Relation Age of Onset  . Breast cancer Maternal Grandmother 28        died early 59's  . Hyperlipidemia Mother   . Heart disease Father   . Cancer Paternal Grandfather     Review of Systems  Genitourinary: Positive for frequency and urgency.       Loss of sexual interest  Night urination   All other systems reviewed and are negative.   Exam:   BP 100/60 (BP Location: Right Arm, Patient Position: Sitting, Cuff Size: Normal)   Pulse 72   Resp 16   Ht 5' 1.25" (1.556 m)   Wt 121 lb 12.8 oz (55.2 kg)   LMP 07/31/2017   BMI 22.83 kg/m     Height: 5' 1.25" (155.6 cm)  Weight loss:  6 # Ht Readings from Last 3 Encounters:  09/28/18 5' 1.25" (1.556 m)  09/29/17 5\' 1"  (1.549 m)  09/07/17 5\' 1"  (1.549 m)    General appearance: alert, cooperative and appears stated age Head: Normocephalic, without obvious abnormality, atraumatic Neck: no adenopathy, supple, symmetrical, trachea midline and thyroid normal to inspection and palpation Lungs: clear to auscultation bilaterally Breasts: normal appearance, no masses or tenderness Heart: regular rate and rhythm Abdomen: soft, non-tender; bowel sounds normal; no masses,  no organomegaly Extremities: extremities normal, atraumatic, no cyanosis or edema Skin: Skin color, texture, turgor normal. No rashes or lesions Lymph nodes: Cervical, supraclavicular, and axillary nodes normal. No abnormal inguinal nodes palpated Neurologic: Grossly normal   Pelvic: External genitalia:  no lesions              Urethra:  normal appearing urethra with no masses, tenderness or lesions              Bartholins and Skenes: normal                 Vagina: normal appearing vagina with normal color and discharge, no lesions              Cervix: absent              Pap taken: No. Bimanual Exam:  Uterus:  uterus absent              Adnexa: normal adnexa and no mass, fullness, tenderness               Rectovaginal: Confirms               Anus:  normal sphincter tone, no lesions  Chaperone was present for exam.  A:  Well Woman  with normal exam H/O endometriosis H/O TALH/bilateral salpingectomy/cystoscopy 08/21/17 Urinary urgency Decreased libido Family hx of breast cancer in [place] in her late 20's H/O endometriosis  P:   Mammogram guidelines reviewed pap smear not indicated Total  testosterone and estradiol levels will be obtained today Urine culture pending Consider elimination diet for IC return annually or prn

## 2018-09-29 ENCOUNTER — Encounter: Payer: Self-pay | Admitting: Obstetrics & Gynecology

## 2018-09-30 LAB — URINE CULTURE

## 2018-10-03 LAB — ESTRADIOL: Estradiol: 36.4 pg/mL

## 2018-10-03 LAB — TESTOSTERONE, TOTAL, LC/MS/MS: Testosterone, total: 9.2 ng/dL — ABNORMAL LOW (ref 10.0–55.0)

## 2018-10-03 LAB — VITAMIN D 25 HYDROXY (VIT D DEFICIENCY, FRACTURES): Vit D, 25-Hydroxy: 28.5 ng/mL — ABNORMAL LOW (ref 30.0–100.0)

## 2018-10-04 LAB — FOLLICLE STIMULATING HORMONE: FSH: 9.2 m[IU]/mL

## 2018-10-04 LAB — SPECIMEN STATUS REPORT

## 2018-10-05 DIAGNOSIS — J029 Acute pharyngitis, unspecified: Secondary | ICD-10-CM | POA: Diagnosis not present

## 2018-10-08 ENCOUNTER — Other Ambulatory Visit: Payer: Self-pay | Admitting: *Deleted

## 2018-10-08 MED ORDER — NONFORMULARY OR COMPOUNDED ITEM: 1 refills | Status: DC

## 2018-10-08 MED ORDER — NONFORMULARY OR COMPOUNDED ITEM: 2 refills | Status: DC

## 2018-12-31 ENCOUNTER — Telehealth: Payer: Self-pay | Admitting: Obstetrics & Gynecology

## 2018-12-31 DIAGNOSIS — R7989 Other specified abnormal findings of blood chemistry: Secondary | ICD-10-CM

## 2018-12-31 DIAGNOSIS — R6882 Decreased libido: Secondary | ICD-10-CM

## 2018-12-31 NOTE — Telephone Encounter (Signed)
Spoke with patient. Lab appointment scheduled for tomorrow 01/01/2019 at 2 pm for testosterone recheck. Patient has been using testosterone cream and is due to a recheck to ensure medication dosing is correct. Patient is agreeable to date and time. Order placed.  Routing to provider and will close encounter.

## 2018-12-31 NOTE — Telephone Encounter (Signed)
Left message to call Kaitlyn at [phone]. 

## 2018-12-31 NOTE — Telephone Encounter (Signed)
Patient states she was prescribed testosterone cream in February and was told have her hormone levels rechecked in 3 months. Patient states she is almost out of cream and wants to know if she can schedule this appointment or to wait. Please advise.

## 2019-01-01 ENCOUNTER — Other Ambulatory Visit (INDEPENDENT_AMBULATORY_CARE_PROVIDER_SITE_OTHER): Payer: BLUE CROSS/BLUE SHIELD

## 2019-01-01 ENCOUNTER — Other Ambulatory Visit: Payer: Self-pay

## 2019-01-01 DIAGNOSIS — R7989 Other specified abnormal findings of blood chemistry: Secondary | ICD-10-CM

## 2019-01-01 DIAGNOSIS — R6882 Decreased libido: Secondary | ICD-10-CM

## 2019-01-03 LAB — TESTOSTERONE, TOTAL, LC/MS/MS: Testosterone, total: 27.4 ng/dL (ref 10.0–55.0)

## 2019-01-07 ENCOUNTER — Telehealth: Payer: Self-pay | Admitting: Obstetrics & Gynecology

## 2019-01-07 NOTE — Telephone Encounter (Signed)
Spoke with patient, advised as seen below per Dr. Miller. Patient verbalizes understanding and is agreeable.   Encounter closed.  

## 2019-01-07 NOTE — Telephone Encounter (Signed)
Spoke with patient. States testosterone cream is working well for decreased libido and vaginal dryness, has a refill on file.   Patient asking how long testosterone cream use is recommended? Long term?   Advised will review with Dr. Sabra Heck and return call. Patient agreeable. Patient request detailed message on mobile number of no answer.  Dr. Sabra Heck  -please advise

## 2019-01-07 NOTE — Telephone Encounter (Signed)
Yes, she can use this long term.  It's only increasing her testosterone to within the normal range for a female.

## 2019-01-07 NOTE — Telephone Encounter (Signed)
Routed to Triage

## 2019-01-07 NOTE — Telephone Encounter (Signed)
Patient left voicemail over lunch for P[place] with questions regarding her results.

## 2019-01-08 ENCOUNTER — Ambulatory Visit: Payer: BLUE CROSS/BLUE SHIELD | Admitting: Obstetrics & Gynecology

## 2019-01-08 ENCOUNTER — Encounter: Payer: Self-pay | Admitting: Obstetrics & Gynecology

## 2019-01-08 ENCOUNTER — Other Ambulatory Visit: Payer: Self-pay

## 2019-01-08 ENCOUNTER — Telehealth: Payer: Self-pay | Admitting: Obstetrics & Gynecology

## 2019-01-08 VITALS — BP 100/60 | HR 80 | Temp 97.7°F | Ht 61.25 in | Wt 123.0 lb

## 2019-01-08 DIAGNOSIS — R350 Frequency of micturition: Secondary | ICD-10-CM | POA: Diagnosis not present

## 2019-01-08 LAB — POCT URINALYSIS DIPSTICK
Bilirubin, UA: NEGATIVE
Blood, UA: NEGATIVE
Glucose, UA: NEGATIVE
Leukocytes, UA: NEGATIVE
Nitrite, UA: NEGATIVE
Protein, UA: NEGATIVE
Urobilinogen, UA: 0.2 E.U./dL
pH, UA: 5 (ref 5.0–8.0)

## 2019-01-08 MED ORDER — NONFORMULARY OR COMPOUNDED ITEM: 1 refills | Status: DC

## 2019-01-08 MED ORDER — SULFAMETHOXAZOLE-TRIMETHOPRIM 800-160 MG PO TABS: 1.0000 | ORAL_TABLET | Freq: Two times a day (BID) | ORAL | 0 refills | Status: DC

## 2019-01-08 NOTE — Telephone Encounter (Signed)
Patient believes she has a UTI. No openings for me to schedule.

## 2019-01-08 NOTE — Progress Notes (Signed)
kGYNECOLOGY  VISIT  CC:   Lower back pain and frequency   HPI: 38 y.o. G0P0000 Married White or Caucasian female here for UTI symptoms that started yesterday.  She's having low back pain and some dysuria.  Denies fever.  No hematuria.  No vaginal discharge or vaginal bleeding.    Testosterone is helping and desires RF.    GYNECOLOGIC HISTORY: Patient's last menstrual period was 07/31/2017. Contraception: hysterectomy  Menopausal hormone therapy: none  Patient Active Problem List   Diagnosis Date Noted  . Endometriosis 05/01/2017  . Spasm of bowel 01/29/2016  . IBS (irritable bowel syndrome) 01/29/2016    Past Medical History:  Diagnosis Date  . Abnormal Pap smear of cervix    2008 or 2009  . Anxiety   . Dental bridge present    lower front  . Dry eyes   . Endometriosis   . Headache    Migraines  . History of indigestion    treat with OTC  . Hypoglycemia   . Irritable bowel syndrome (IBS)   . Seasonal allergies   . Trigger thumb of right hand 11/2013    Past Surgical History:  Procedure Laterality Date  . CHOLECYSTECTOMY  06/2015  . CHROMOPERTUBATION N/A 05/30/2016   Procedure: CHROMOPERTUBATION;  Surgeon: Megan Salon, MD;  Location: [facility];  Service: Gynecology;  Laterality: N/A;  . COLPOSCOPY  2008  . CYSTOSCOPY N/A 08/21/2017   Procedure: CYSTOSCOPY;  Surgeon: Megan Salon, MD;  Location: [facility];  Service: Gynecology;  Laterality: N/A;  . LABIOPLASTY N/A 08/21/2017   Procedure: LABIAPLASTY;  Surgeon: Megan Salon, MD;  Location: [facility];  Service: Gynecology;  Laterality: N/A;  . LAPAROSCOPY N/A 05/30/2016   Procedure: LAPAROSCOPY OPERATIVE WITH LYSIS OF ADHESIONS, EXCISION OF POSSIBLE ENDOMETRIOSIS;  Surgeon: Megan Salon, MD;  Location: [facility];  Service: Gynecology;  Laterality: N/A;  . REFRACTIVE SURGERY Bilateral 02/2015  . TONSILLECTOMY  1988  . TOTAL LAPAROSCOPIC HYSTERECTOMY WITH SALPINGECTOMY Bilateral 08/21/2017   Procedure: TOTAL LAPAROSCOPIC  HYSTERECTOMY WITH SALPINGECTOMY;  Surgeon: Megan Salon, MD;  Location: [facility];  Service: Gynecology;  Laterality: Bilateral;  . TRIGGER FINGER RELEASE Right 12/23/2013   Procedure: RIGHT THUMB TRIGGER RELEASE ;  Surgeon: Tennis Must, MD;  Location: [facility];  Service: Orthopedics;  Laterality: Right;    MEDS:   Current Outpatient Medications on File Prior to Visit  Medication Sig Dispense Refill  . ALPRAZolam (XANAX) 0.25 MG tablet Take 1 tablet (0.25 mg total) by mouth at bedtime as needed for anxiety. 30 tablet 0  . Carboxymethylcellulose Sod PF (REFRESH PLUS) 0.5 % SOLN Place 1 drop into both eyes 2 (two) times daily.    . Cholecalciferol (VITAMIN D3) 2000 units TABS Take 2,000 Units by mouth at bedtime.    Marland Kitchen eletriptan (RELPAX) 40 MG tablet 1 TABLET AS NEEDED ONE TIME ONCE A DAY AS NEEDED FOR HEADACHES ORALLY    . JUBLIA 10 % SOLN APPLY TOPICALLY TO THE AFFECTED AREA EVERY DAY    . montelukast (SINGULAIR) 10 MG tablet Take 10 mg by mouth at bedtime.    . Multiple Vitamin (MULTIVITAMIN WITH MINERALS) TABS tablet Take 1 tablet by mouth at bedtime.    . NONFORMULARY OR COMPOUNDED ITEM Testosterone 1mg /0.37ml topical cream.  Apply 0.45ml three times weekly to thighs. Disp: 90 day supply. 1 each 1  . Simethicone (GAS-X EXTRA STRENGTH) 125 MG CAPS Take 125 mg by mouth at bedtime.    Marland Kitchen  SUMAtriptan (IMITREX) 25 MG tablet Take 25 mg by mouth every 2 (two) hours as needed for migraine. May repeat in 2 hours if headache persists or recurs.    . valACYclovir (VALTREX) 1000 MG tablet TAKE 1 TABLET BY MOUTH EVERY DAY 90 tablet 4   No current facility-administered medications on file prior to visit.     ALLERGIES: Other  Family History  Problem Relation Age of Onset  . Breast cancer Maternal Grandmother 28       died early 53's  . Hyperlipidemia Mother   . Heart disease Father   . Cancer Paternal Grandfather     SH:  Married, non smoker  Review of Systems  Genitourinary:  Positive for flank pain and frequency.  All other systems reviewed and are negative.   PHYSICAL EXAMINATION:    BP 100/60   Pulse 80   Temp 97.7 F (36.5 C) (Temporal)   Ht 5' 1.25" (1.556 m)   Wt 123 lb (55.8 kg)   LMP 07/31/2017   BMI 23.05 kg/m     General appearance: alert, cooperative and appears stated age Lungs:  clear to auscultation, no wheezes, rales or rhonchi, symmetric air entry Flank:  No CVA tenderness Abdomen: soft, non-tender; bowel sounds normal; no masses,  no organomegaly Lymph:  no inguinal LAD noted  Pelvic: External genitalia:  no lesions              Urethra:  normal appearing urethra with no masses, tenderness or lesions              Bartholins and Skenes: normal                 Vagina: normal appearing vagina with normal color and discharge, no lesions              Cervix: absent              Bimanual Exam:  Uterus:  uterus absent              Adnexa: no mass, fullness, tenderness  Chaperone was present for exam.  Assessment: Possible UTI  Plan: Bactrim DS BID x 3 day Urine culture pending

## 2019-01-08 NOTE — Telephone Encounter (Signed)
Spoke with patient. Reports bladder "achiness and tenderness" that started on 5/18, lower back pain and urinary frequency started today. Denies fever/chills, N/V, or blood in urine. Covid 19 pre-screen negative. Patient requesting OV with Dr. Sabra Heck. Ov scheduled for today at 4:30pm with Dr. Sabra Heck.   Patient verbalizes understanding and is agreeable. Encounter closed.

## 2019-01-09 LAB — URINE CULTURE

## 2019-01-09 LAB — URINALYSIS, MICROSCOPIC ONLY
Bacteria, UA: NONE SEEN
Casts: NONE SEEN /lpf

## 2019-02-07 DIAGNOSIS — F418 Other specified anxiety disorders: Secondary | ICD-10-CM | POA: Diagnosis not present

## 2019-02-07 DIAGNOSIS — Z13220 Encounter for screening for lipoid disorders: Secondary | ICD-10-CM | POA: Diagnosis not present

## 2019-02-07 DIAGNOSIS — Z0000 Encounter for general adult medical examination without abnormal findings: Secondary | ICD-10-CM | POA: Diagnosis not present

## 2019-02-07 DIAGNOSIS — Z9109 Other allergy status, other than to drugs and biological substances: Secondary | ICD-10-CM | POA: Diagnosis not present

## 2019-03-15 DIAGNOSIS — F4322 Adjustment disorder with anxiety: Secondary | ICD-10-CM | POA: Diagnosis not present

## 2019-04-01 DIAGNOSIS — F4322 Adjustment disorder with anxiety: Secondary | ICD-10-CM | POA: Diagnosis not present

## 2019-04-23 DIAGNOSIS — F4322 Adjustment disorder with anxiety: Secondary | ICD-10-CM | POA: Diagnosis not present

## 2019-05-08 ENCOUNTER — Other Ambulatory Visit: Payer: Self-pay

## 2019-05-08 ENCOUNTER — Encounter: Payer: Self-pay | Admitting: Certified Nurse Midwife

## 2019-05-08 ENCOUNTER — Ambulatory Visit (INDEPENDENT_AMBULATORY_CARE_PROVIDER_SITE_OTHER): Payer: BC Managed Care – PPO | Admitting: Certified Nurse Midwife

## 2019-05-08 VITALS — BP 110/68 | HR 64 | Temp 97.4°F | Resp 16 | Wt 123.0 lb

## 2019-05-08 DIAGNOSIS — N3001 Acute cystitis with hematuria: Secondary | ICD-10-CM

## 2019-05-08 MED ORDER — NITROFURANTOIN MONOHYD MACRO 100 MG PO CAPS: 100.0000 mg | ORAL_CAPSULE | Freq: Two times a day (BID) | ORAL | 0 refills | Status: DC

## 2019-05-08 MED ORDER — PHENAZOPYRIDINE HCL 100 MG PO TABS: 100.0000 mg | ORAL_TABLET | Freq: Three times a day (TID) | ORAL | 0 refills | Status: DC | PRN

## 2019-05-08 NOTE — Progress Notes (Signed)
38 y.o. Married Caucasian female G0P0000 here with complaint of UTI, with onset  on 24 hours ago. Patient complaining of urinary frequency/urgency/ and pain with urination. Noted blood urine also. Patient denies fever, chills, nausea or back pain. No new personal products. Patient feels not related to sexual activity. Denies any vaginal symptoms, except for dryness.. Has started Testosterone cream which seems to helped with symptoms. Patient drinking adequate water intake. Occasional soda intake. No other health issues today.  Review of Systems  Constitutional: Negative.   HENT: Negative.   Eyes: Negative.   Respiratory: Negative.   Cardiovascular: Negative.   Gastrointestinal: Negative.   Genitourinary: Positive for dysuria, frequency and urgency.       Blood in urine  Musculoskeletal: Negative.   Skin: Negative.   Neurological: Negative.   Endo/Heme/Allergies: Negative.   Psychiatric/Behavioral: Negative.     O: Healthy female WDWN Affect: Normal, orientation x 3 Skin : warm and dry CVAT: negative bilateral Abdomen: postive for suprapubic tenderness  Pelvic exam: External genital area: normal, no lesions Bladder,Urethra tender, Urethral meatus: tender, red Vagina: normal vaginal discharge, scant moisture noted but normal appearance    Cervix/Uterus absent Adnexa: normal non tender, no fullness or masses  Unable to dip due to AZO use  A: UTI Normal pelvic exam Suspect vaginal dryness contributing to UTI  P: Reviewed findings of UTI and need for treatment. [id number] see order with instructions Rx Pyridium see order with instructions [id number] culture Reviewed warning signs and symptoms of UTI and need to advise if occurring. Encouraged to limit soda, tea, and coffee and be sure to increase water intake. Discussed patting after urination instead of wiping to decrease irritation. Suggested using small amount after UTI resolved, Olive oil around urethral meatus to help with  reduction of UTI. Questions addressed.   RV prn

## 2019-05-08 NOTE — Patient Instructions (Signed)

## 2019-05-11 LAB — URINE CULTURE

## 2019-05-27 DIAGNOSIS — F4322 Adjustment disorder with anxiety: Secondary | ICD-10-CM | POA: Diagnosis not present

## 2019-05-31 ENCOUNTER — Telehealth (INDEPENDENT_AMBULATORY_CARE_PROVIDER_SITE_OTHER): Payer: BC Managed Care – PPO | Admitting: Family

## 2019-05-31 ENCOUNTER — Encounter: Payer: Self-pay | Admitting: Family

## 2019-05-31 ENCOUNTER — Other Ambulatory Visit: Payer: Self-pay

## 2019-05-31 VITALS — Ht 61.0 in | Wt 119.0 lb

## 2019-05-31 DIAGNOSIS — N809 Endometriosis, unspecified: Secondary | ICD-10-CM | POA: Diagnosis not present

## 2019-05-31 DIAGNOSIS — E162 Hypoglycemia, unspecified: Secondary | ICD-10-CM

## 2019-05-31 DIAGNOSIS — F419 Anxiety disorder, unspecified: Secondary | ICD-10-CM

## 2019-05-31 DIAGNOSIS — K580 Irritable bowel syndrome with diarrhea: Secondary | ICD-10-CM

## 2019-05-31 DIAGNOSIS — G4700 Insomnia, unspecified: Secondary | ICD-10-CM

## 2019-05-31 NOTE — Progress Notes (Signed)
Subjective:    Patient ID: [name], female    DOB: 06-02-1981, 38 y.o.   MRN: [id number]  HPI  Virtual Visit via Video Note  I connected with [name] on 05/31/19 at  8:40 AM EDT by a video enabled telemedicine application and verified that I am speaking with the correct person using two identifiers.  Location: Patient: home Provider: office   I discussed the limitations of evaluation and management by telemedicine and the availability of in person appointments. The patient expressed understanding and agreed to proceed.  History of Present Illness:  Seasonal allergies- takes daily singulair. Uses allegra or zyrtec. Spring and fall are bad for her.    IBS- she reports that this is mostly diarrhea, occasional constipation.  Takes a daily probiotic. Worsened by stress.  Hypoglycemia- reports that she was diagnosed 8-10 years ago.  Reports that she changed her eating habits, and can keep under control.    Endometriosis-  Got married in 2015, came off of OCP at that time.  Bad cramps/pain returned.  Reports that she had exp lap.  Had hysterectomy 2018.    Anxiety- reports that she has had a lot of anxiety recently- husband is a Engineer, structural.  Not sleeping great.  She has used xanax, klonopin (for sleep).  Feels like since the gym has opened back up she has had some stress relief.    She sees Hale Bogus for [employer].   Total chol- 252 HDL- 3.2 LDL- 155 Triglycerides 88    Review of Systems  Constitutional: Negative for unexpected weight change.  HENT: Negative for hearing loss and rhinorrhea.   Eyes: Negative for visual disturbance.  Respiratory: Negative for cough.   Cardiovascular: Negative for leg swelling.  Gastrointestinal: Positive for diarrhea. Negative for blood in stool and constipation.  Genitourinary: Positive for urgency (has spoken to GYN, some concern about possible IC-has declined further work up at this time). Negative for dysuria, frequency and  hematuria.  Musculoskeletal: Negative for arthralgias and myalgias.  Skin: Negative for rash.  Neurological: Positive for headaches (Reports migraines, relieved by triptan).  Hematological: Negative for adenopathy.   Past Medical History:  Diagnosis Date  . Abnormal Pap smear of cervix    2008 or 2009  . Anxiety   . Dental bridge present    lower front  . Dry eyes   . Endometriosis   . Headache    Migraines  . History of indigestion    treat with OTC  . Hypoglycemia   . Irritable bowel syndrome (IBS)   . Seasonal allergies   . Trigger thumb of right hand 11/2013     Social History   Socioeconomic History  . Marital status: Married    Spouse name: Not on file  . Number of children: Not on file  . Years of education: Not on file  . Highest education level: Not on file  Occupational History  . Occupation: Surgical tech  Social Needs  . Financial resource strain: Not hard at all  . Food insecurity    Worry: Never true    Inability: Never true  . Transportation needs    Medical: No    Non-medical: No  Tobacco Use  . Smoking status: Former Smoker    Quit date: 08/22/2010    Years since quitting: 8.7  . Smokeless tobacco: Never Used  Substance and Sexual Activity  . Alcohol use: Yes    Alcohol/week: 3.0 - 4.0 standard drinks  Types: 3 - 4 Standard drinks or equivalent per week    Comment: per week  . Drug use: No  . Sexual activity: Yes    Partners: Male    Birth control/protection: Surgical    Comment: TLH  Lifestyle  . Physical activity    Days per week: 4 days    Minutes per session: 60 min  . Stress: Not on file  Relationships  . Social connections    Talks on phone: More than three times a week    Gets together: Once a week    Attends religious service: Never    Active member of club or organization: No    Attends meetings of clubs or organizations: Never    Relationship status: Married  . Intimate partner violence    Fear of current or ex partner:  No    Emotionally abused: No    Physically abused: No    Forced sexual activity: No  Other Topics Concern  . Not on file  Social History Narrative   She is surgical tech in the OR   Married   No children   [employer] Bully    Enjoys hiking with dog, exercise for stress relief    Past Surgical History:  Procedure Laterality Date  . CHOLECYSTECTOMY  06/2015  . CHROMOPERTUBATION N/A 05/30/2016   Procedure: CHROMOPERTUBATION;  Surgeon: Megan Salon, MD;  Location: [facility];  Service: Gynecology;  Laterality: N/A;  . COLPOSCOPY  2008  . CYSTOSCOPY N/A 08/21/2017   Procedure: CYSTOSCOPY;  Surgeon: Megan Salon, MD;  Location: [facility];  Service: Gynecology;  Laterality: N/A;  . LABIOPLASTY N/A 08/21/2017   Procedure: LABIAPLASTY;  Surgeon: Megan Salon, MD;  Location: [facility];  Service: Gynecology;  Laterality: N/A;  . LAPAROSCOPY N/A 05/30/2016   Procedure: LAPAROSCOPY OPERATIVE WITH LYSIS OF ADHESIONS, EXCISION OF POSSIBLE ENDOMETRIOSIS;  Surgeon: Megan Salon, MD;  Location: [facility];  Service: Gynecology;  Laterality: N/A;  . REFRACTIVE SURGERY Bilateral 02/2015  . TONSILLECTOMY  1988  . TOTAL LAPAROSCOPIC HYSTERECTOMY WITH SALPINGECTOMY Bilateral 08/21/2017   Procedure: TOTAL LAPAROSCOPIC HYSTERECTOMY WITH SALPINGECTOMY;  Surgeon: Megan Salon, MD;  Location: [facility];  Service: Gynecology;  Laterality: Bilateral;  . TRIGGER FINGER RELEASE Right 12/23/2013   Procedure: RIGHT THUMB TRIGGER RELEASE ;  Surgeon: Tennis Must, MD;  Location: [facility];  Service: Orthopedics;  Laterality: Right;    Family History  Problem Relation Age of Onset  . Breast cancer Maternal Grandmother 28       died early 16's  . Hyperlipidemia Mother   . Heart disease Father        valve replacement, aortic aneurysm  . Cancer Paternal Grandfather        Jaw cancer, smoker    Allergies  Allergen Reactions  . Other     Dermabond.  Topical blisters.    Current Outpatient Medications on  File Prior to Visit  Medication Sig Dispense Refill  . Carboxymethylcellulose Sod PF (REFRESH PLUS) 0.5 % SOLN Place 1 drop into both eyes 2 (two) times daily.    . Cholecalciferol (VITAMIN D3) 2000 units TABS Take 2,000 Units by mouth at bedtime.    . clonazePAM (KLONOPIN) 0.5 MG tablet TAKE 1/2  1 TABLET BY MOUTH AT BEDTIME    . eletriptan (RELPAX) 40 MG tablet 1 TABLET AS NEEDED ONE TIME ONCE A DAY AS NEEDED FOR HEADACHES ORALLY    . montelukast (SINGULAIR) 10 MG tablet Take  10 mg by mouth at bedtime.    . Multiple Vitamin (MULTIVITAMIN WITH MINERALS) TABS tablet Take 1 tablet by mouth at bedtime.    . NONFORMULARY OR COMPOUNDED ITEM Testosterone 1mg /0.63ml topical cream.  Apply 0.81ml three times weekly to thighs. Disp: 90 day supply. 1 each 1  . Simethicone (GAS-X EXTRA STRENGTH) 125 MG CAPS Take 125 mg by mouth at bedtime.    . valACYclovir (VALTREX) 1000 MG tablet TAKE 1 TABLET BY MOUTH EVERY DAY 90 tablet 4  . phenazopyridine (PYRIDIUM) 100 MG tablet Take 1 tablet (100 mg total) by mouth 3 (three) times daily as needed for pain. (Patient not taking: Reported on 05/31/2019) 10 tablet 0   No current facility-administered medications on file prior to visit.     Ht 5\' 1"  (1.549 m)   Wt 119 lb (54 kg)   LMP 07/31/2017   BMI 22.48 kg/m       Objective:   Physical Exam    Gen: Awake, alert, no acute distress Resp: Breathing is even and non-labored Psych: calm/pleasant demeanor Neuro: Alert and Oriented x 3, + facial symmetry, speech is clear.       Assessment & Plan:    IBS- discussed use of prn imodium, probiotics.  Hypoglycemia- stable with regular meals/regular food intake. Monitor.  Hx of endometriosis- stable following abdominal hysterectomy.  Anxiety- overall seems to be managing well- using exercise to help manage stress. We discussed possibility of counseling if symptoms worsen.  Insomnia- discussed good sleep hygiene, melatonin use prn. If symptoms worsen or  fail to improve, could consider trial of trazodone.   Follow Up Instructions:    I discussed the assessment and treatment plan with the patient. The patient was provided an opportunity to ask questions and all were answered. The patient agreed with the plan and demonstrated an understanding of the instructions.   The patient was advised to call back or seek an in-person evaluation if the symptoms worsen or if the condition fails to improve as anticipated.  Nance Pear, NP

## 2019-07-09 ENCOUNTER — Telehealth: Payer: Self-pay | Admitting: Obstetrics & Gynecology

## 2019-07-09 ENCOUNTER — Encounter: Payer: Self-pay | Admitting: Obstetrics & Gynecology

## 2019-07-09 DIAGNOSIS — N390 Urinary tract infection, site not specified: Secondary | ICD-10-CM

## 2019-07-09 MED ORDER — SULFAMETHOXAZOLE-TRIMETHOPRIM 800-160 MG PO TABS: 1.0000 | ORAL_TABLET | Freq: Two times a day (BID) | ORAL | 0 refills | Status: DC

## 2019-07-09 NOTE — Telephone Encounter (Signed)
Patient is returning call to Jill. °

## 2019-07-09 NOTE — Telephone Encounter (Signed)
Spoke with patient. Advised per Dr. Sabra Heck.   1. Patient states she is unable to confirm if she can get to the office today to provide urine sample. She will return call later in the day to schedule if she can.   2. Rx for Bactrim DS to pharmacy.  3. Patient request to proceed with referral to urology, Dr. Matilde Sprang. Order placed. Is aware she will be called with appt info once scheduled.   4. Patient asking if Dr. Sabra Heck will prescribe prophylaxis abx in the meantime?   Routing to Dr. Sabra Heck to advise.

## 2019-07-09 NOTE — Telephone Encounter (Signed)
Left message to call Candid Bovey, RN at [facility] [phone].   

## 2019-07-09 NOTE — Telephone Encounter (Signed)
Hi Dr Sabra Heck,   [facility] you are doing well. I left a message on the office voicemail as well. I am coming back from vacation and today is my first day back to work. I woke up this morning with some urgency to urinate. I had some pain at the end. After about three or four trips to the bathroom I started bleeding when I urinated. The discomfort is worse now than when I first woke up. I know my work day is busy and being my first day back from vacation I think it will be difficult to get in for an appointment. Could you please advise?  Also, I am ready for the urology referral. I think when we discussed the IC last year you where going to refer me to Dr. Matilde Sprang? He is who I would like to see, if possible.  Thank you so much.  [name]  [phone]

## 2019-07-09 NOTE — Telephone Encounter (Signed)
I would love to have a urine sample for culture if possible.  She can just have lab appt.  Ok to put in urine culture if she can do this.  Also, ok to send rx for bactrim DS bid x 3 days as her other UTI's this past year were sensitive to this.  Ok to refer to urology as well for recurrent UTIs.  They are likely going to recommend prophylaxis after intercourse to start with but if she wants to see urology first, that is ok.  Please put in referral recurrent UTIs and pt desires referral.  Thanks.

## 2019-07-09 NOTE — Telephone Encounter (Signed)
Spoke with patient. Patient reports urinary frequency, urgency, dysuria, mid lower back pain and blood in urine. Symptoms started today. Denies fever/chills or flank pain. Hx of IC and recurrent UTI. Last tx 05/08/19 for e coli UTI. Patient declined OV, returned to work today, unable to be seen. Offered late appt with covering provider, patient declined. Reviewed after hour options, such as urgent care, to meet patients work schedule needs.   Patient is requesting Dr. Sabra Heck review and advise on RX and referral to urology. Advised patient Dr. Sabra Heck is in the OR, response may not be immediate, patient verbalizes understanding.   Dr. Sabra Heck -please advise.

## 2019-07-09 NOTE — Telephone Encounter (Signed)
Left message to call Rhealyn Cullen, RN at [facility] [phone].   

## 2019-07-09 NOTE — Telephone Encounter (Signed)
Patient left message on answering machine wanting to get a prescription for uti symptoms. She will be coming back to town and not able to come in for appointment.

## 2019-07-11 NOTE — Telephone Encounter (Signed)
OK to start trimethoprim 100mg  within 30 minutes of intercourse.  #30/1RF

## 2019-07-11 NOTE — Telephone Encounter (Signed)
Left message to call Corrinne Benegas, RN at [facility] [phone].   

## 2019-07-12 MED ORDER — TRIMETHOPRIM 100 MG PO TABS: ORAL_TABLET | ORAL | 1 refills | Status: DC

## 2019-07-12 NOTE — Telephone Encounter (Signed)
Spoke with patient, advised per Dr. Sabra Heck. Patient request RX to pharamcy on file. Patient will proceed with referral to urology as well. Patient is aware she will be contacted with appt details once scheduled.   Routing to provider for final review. Patient is agreeable to disposition. Will close encounter.   Cc: [name]

## 2019-07-27 DIAGNOSIS — R05 Cough: Secondary | ICD-10-CM | POA: Diagnosis not present

## 2019-07-27 DIAGNOSIS — R5383 Other fatigue: Secondary | ICD-10-CM | POA: Diagnosis not present

## 2019-07-27 DIAGNOSIS — Z9189 Other specified personal risk factors, not elsewhere classified: Secondary | ICD-10-CM | POA: Diagnosis not present

## 2019-07-27 DIAGNOSIS — R52 Pain, unspecified: Secondary | ICD-10-CM | POA: Diagnosis not present

## 2019-08-26 DIAGNOSIS — N3020 Other chronic cystitis without hematuria: Secondary | ICD-10-CM | POA: Diagnosis not present

## 2019-08-27 DIAGNOSIS — R05 Cough: Secondary | ICD-10-CM | POA: Diagnosis not present

## 2019-08-27 DIAGNOSIS — J111 Influenza due to unidentified influenza virus with other respiratory manifestations: Secondary | ICD-10-CM | POA: Diagnosis not present

## 2019-09-03 ENCOUNTER — Telehealth: Payer: Self-pay

## 2019-09-03 NOTE — Telephone Encounter (Signed)
Patient called today with a question regarding Covid vaccine. Her spouse tested positive last night for Covid and she has her appointment today to get her Covid vaccine. Reached out to PCP and she stated patient should be ok to get vaccine today as scheduled.

## 2019-09-09 ENCOUNTER — Other Ambulatory Visit: Payer: Self-pay | Admitting: Obstetrics & Gynecology

## 2019-09-09 DIAGNOSIS — Z01419 Encounter for gynecological examination (general) (routine) without abnormal findings: Secondary | ICD-10-CM

## 2019-09-10 DIAGNOSIS — R300 Dysuria: Secondary | ICD-10-CM | POA: Diagnosis not present

## 2019-09-10 DIAGNOSIS — N3020 Other chronic cystitis without hematuria: Secondary | ICD-10-CM | POA: Diagnosis not present

## 2019-09-10 NOTE — Telephone Encounter (Signed)
Med refill request: Valtrex Last AEX: 09/28/2018 Next AEX: 02/11/2020 Last MMG (if hormonal med)  10/2015, BIRADS 1, negative Refill authorized: # 30, #7 RF, orders pended if approved

## 2019-09-23 DIAGNOSIS — N3020 Other chronic cystitis without hematuria: Secondary | ICD-10-CM | POA: Diagnosis not present

## 2019-11-08 ENCOUNTER — Encounter: Payer: Self-pay | Admitting: Certified Nurse Midwife

## 2019-11-12 ENCOUNTER — Other Ambulatory Visit: Payer: Self-pay

## 2019-11-12 ENCOUNTER — Ambulatory Visit (INDEPENDENT_AMBULATORY_CARE_PROVIDER_SITE_OTHER): Payer: BC Managed Care – PPO | Admitting: Family

## 2019-11-12 ENCOUNTER — Encounter: Payer: Self-pay | Admitting: Family

## 2019-11-12 VITALS — BP 112/82 | HR 78 | Temp 97.1°F | Resp 16 | Ht 61.0 in | Wt 122.8 lb

## 2019-11-12 DIAGNOSIS — E785 Hyperlipidemia, unspecified: Secondary | ICD-10-CM | POA: Diagnosis not present

## 2019-11-12 DIAGNOSIS — Z0000 Encounter for general adult medical examination without abnormal findings: Secondary | ICD-10-CM

## 2019-11-12 DIAGNOSIS — N3281 Overactive bladder: Secondary | ICD-10-CM

## 2019-11-12 DIAGNOSIS — G4700 Insomnia, unspecified: Secondary | ICD-10-CM

## 2019-11-12 DIAGNOSIS — G43909 Migraine, unspecified, not intractable, without status migrainosus: Secondary | ICD-10-CM

## 2019-11-12 LAB — COMPREHENSIVE METABOLIC PANEL
ALT: 12 U/L (ref 0–35)
AST: 13 U/L (ref 0–37)
Albumin: 5 g/dL (ref 3.5–5.2)
Alkaline Phosphatase: 58 U/L (ref 39–117)
BUN: 12 mg/dL (ref 6–23)
CO2: 28 mEq/L (ref 19–32)
Calcium: 10.1 mg/dL (ref 8.4–10.5)
Chloride: 100 mEq/L (ref 96–112)
Creatinine, Ser: 0.57 mg/dL (ref 0.40–1.20)
GFR: 118.02 mL/min (ref >60.00)
Glucose, Bld: 101 mg/dL — ABNORMAL HIGH (ref 70–99)
Potassium: 4.7 mEq/L (ref 3.5–5.1)
Sodium: 135 mEq/L (ref 135–145)
Total Bilirubin: 0.6 mg/dL (ref 0.2–1.2)
Total Protein: 7.5 g/dL (ref 6.0–8.3)

## 2019-11-12 LAB — LIPID PANEL
Cholesterol: 269 mg/dL — ABNORMAL HIGH (ref 0–200)
HDL: 95.6 mg/dL (ref >39.00)
LDL Cholesterol: 157 mg/dL — ABNORMAL HIGH (ref 0–99)
NonHDL: 172.91
Total CHOL/HDL Ratio: 3
Triglycerides: 79 mg/dL (ref 0.0–149.0)
VLDL: 15.8 mg/dL (ref 0.0–40.0)

## 2019-11-12 LAB — CBC WITH DIFFERENTIAL/PLATELET
Basophils Absolute: 0 10*3/uL (ref 0.0–0.1)
Basophils Relative: 0.6 % (ref 0.0–3.0)
Eosinophils Absolute: 0.1 10*3/uL (ref 0.0–0.7)
Eosinophils Relative: 1.6 % (ref 0.0–5.0)
HCT: 40.5 % (ref 36.0–46.0)
Hemoglobin: 13.8 g/dL (ref 12.0–15.0)
Lymphocytes Relative: 39.2 % (ref 12.0–46.0)
Lymphs Abs: 2.8 10*3/uL (ref 0.7–4.0)
MCHC: 34.1 g/dL (ref 30.0–36.0)
MCV: 92.8 fl (ref 78.0–100.0)
Monocytes Absolute: 0.5 10*3/uL (ref 0.1–1.0)
Monocytes Relative: 6.6 % (ref 3.0–12.0)
Neutro Abs: 3.7 10*3/uL (ref 1.4–7.7)
Neutrophils Relative %: 52 % (ref 43.0–77.0)
Platelets: 248 10*3/uL (ref 150.0–400.0)
RBC: 4.37 Mil/uL (ref 3.87–5.11)
RDW: 12.5 % (ref 11.5–15.5)
WBC: 7.1 10*3/uL (ref 4.0–10.5)

## 2019-11-12 LAB — TSH: TSH: 1.23 u[IU]/mL (ref 0.35–4.50)

## 2019-11-12 MED ORDER — TRAZODONE HCL 50 MG PO TABS: ORAL_TABLET | ORAL | 2 refills | Status: DC

## 2019-11-12 MED ORDER — ELETRIPTAN HYDROBROMIDE 40 MG PO TABS: ORAL_TABLET | ORAL | 1 refills | Status: DC

## 2019-11-12 MED ORDER — MONTELUKAST SODIUM 10 MG PO TABS: 10.0000 mg | ORAL_TABLET | Freq: Every day | ORAL | 1 refills | Status: DC

## 2019-11-12 MED ORDER — OXYBUTYNIN CHLORIDE ER 10 MG PO TB24: 10.0000 mg | ORAL_TABLET | Freq: Every day | ORAL | 5 refills | Status: DC

## 2019-11-12 NOTE — Progress Notes (Signed)
Subjective:    Patient ID: [name], female    DOB: Feb 01, 1981, 39 y.o.   MRN: [id number]  HPI  Patient is a 39 yr old female who presents today for cpx.  Immunizations: up to date Diet: healthy Exercise: goes to the gym 3 times a week, walks the dog Pap Smear:  hysterectomy Mammogram: starts next year Vision: 2016 Dental: up to date Wt Readings from Last 3 Encounters:  11/12/19 122 lb 12.8 oz (55.7 kg)  05/31/19 119 lb (54 kg)  05/08/19 123 lb (55.8 kg)     Allergies- uses singulair/allegra.  IBS- fair control.   Reports very rare hypoglycemia symptoms.   Reports weekly migraines-uses relpax.   Insomnia- continues to be an issue for her.   Review of Systems  Constitutional: Negative for unexpected weight change.  HENT: Negative for hearing loss and rhinorrhea.   Eyes: Negative for visual disturbance.  Respiratory: Negative for cough and shortness of breath.   Cardiovascular: Negative for chest pain and leg swelling.  Gastrointestinal: Positive for diarrhea. Negative for blood in stool.  Genitourinary:       Saw urology this year- had 4 UTI's in 1 year. Had negative work up.    Musculoskeletal: Negative for arthralgias and myalgias.  Skin: Negative for rash.  Neurological: Positive for headaches.  Hematological: Negative for adenopathy.  Psychiatric/Behavioral:       Denies current issues with depression/anxiety.    Past Medical History:  Diagnosis Date  . Abnormal Pap smear of cervix    2008 or 2009  . Anxiety   . Dental bridge present    lower front  . Dry eyes   . Endometriosis   . Headache    Migraines  . History of indigestion    treat with OTC  . Hypoglycemia   . Irritable bowel syndrome (IBS)   . Seasonal allergies   . Trigger thumb of right hand 11/2013     Social History   Socioeconomic History  . Marital status: Married    Spouse name: Not on file  . Number of children: Not on file  . Years of education: Not on file  .  Highest education level: Not on file  Occupational History  . Occupation: Surgical tech  Tobacco Use  . Smoking status: Former Smoker    Quit date: 08/22/2010    Years since quitting: 9.2  . Smokeless tobacco: Never Used  Substance and Sexual Activity  . Alcohol use: Yes    Alcohol/week: 3.0 - 4.0 standard drinks    Types: 3 - 4 Standard drinks or equivalent per week    Comment: per week  . Drug use: No  . Sexual activity: Yes    Partners: Male    Birth control/protection: Surgical    Comment: TLH  Other Topics Concern  . Not on file  Social History Narrative   She is surgical tech in the OR   Married   No children   American Bully    Enjoys hiking with dog, exercise for stress relief   Social Determinants of Health   Financial Resource Strain: Low Risk   . Difficulty of Paying Living Expenses: Not hard at all  Food Insecurity: No Food Insecurity  . Worried About Charity fundraiser in the Last Year: Never true  . Ran Out of Food in the Last Year: Never true  Transportation Needs: No Transportation Needs  . Lack of Transportation (Medical): No  . Lack of Transportation (Non-Medical):  No  Physical Activity: Sufficiently Active  . Days of Exercise per Week: 4 days  . Minutes of Exercise per Session: 60 min  Stress:   . Feeling of Stress :   Social Connections: Somewhat Isolated  . Frequency of Communication with Friends and Family: More than three times a week  . Frequency of Social Gatherings with Friends and Family: Once a week  . Attends Religious Services: Never  . Active Member of Clubs or Organizations: No  . Attends Archivist Meetings: Never  . Marital Status: Married  Human resources officer Violence: Not At Risk  . Fear of Current or Ex-Partner: No  . Emotionally Abused: No  . Physically Abused: No  . Sexually Abused: No    Past Surgical History:  Procedure Laterality Date  . CHOLECYSTECTOMY  06/2015  . CHROMOPERTUBATION N/A 05/30/2016   Procedure:  CHROMOPERTUBATION;  Surgeon: Megan Salon, MD;  Location: [facility];  Service: Gynecology;  Laterality: N/A;  . COLPOSCOPY  2008  . CYSTOSCOPY N/A 08/21/2017   Procedure: CYSTOSCOPY;  Surgeon: Megan Salon, MD;  Location: [facility];  Service: Gynecology;  Laterality: N/A;  . LABIOPLASTY N/A 08/21/2017   Procedure: LABIAPLASTY;  Surgeon: Megan Salon, MD;  Location: [facility];  Service: Gynecology;  Laterality: N/A;  . LAPAROSCOPY N/A 05/30/2016   Procedure: LAPAROSCOPY OPERATIVE WITH LYSIS OF ADHESIONS, EXCISION OF POSSIBLE ENDOMETRIOSIS;  Surgeon: Megan Salon, MD;  Location: [facility];  Service: Gynecology;  Laterality: N/A;  . REFRACTIVE SURGERY Bilateral 02/2015  . TONSILLECTOMY  1988  . TOTAL LAPAROSCOPIC HYSTERECTOMY WITH SALPINGECTOMY Bilateral 08/21/2017   Procedure: TOTAL LAPAROSCOPIC HYSTERECTOMY WITH SALPINGECTOMY;  Surgeon: Megan Salon, MD;  Location: [facility];  Service: Gynecology;  Laterality: Bilateral;  . TRIGGER FINGER RELEASE Right 12/23/2013   Procedure: RIGHT THUMB TRIGGER RELEASE ;  Surgeon: Tennis Must, MD;  Location: [facility];  Service: Orthopedics;  Laterality: Right;    Family History  Problem Relation Age of Onset  . Breast cancer Maternal Grandmother 28       died early 71's  . Hyperlipidemia Mother   . Heart disease Father        valve replacement, aortic aneurysm  . Cancer Paternal Grandfather        Jaw cancer, smoker    Allergies  Allergen Reactions  . Other     Dermabond.  Topical blisters.    Current Outpatient Medications on File Prior to Visit  Medication Sig Dispense Refill  . Carboxymethylcellulose Sod PF (REFRESH PLUS) 0.5 % SOLN Place 1 drop into both eyes 2 (two) times daily.    . Cholecalciferol (VITAMIN D3) 2000 units TABS Take 2,000 Units by mouth at bedtime.    Marland Kitchen MELATONIN ER PO Take by mouth.    . Multiple Vitamin (MULTIVITAMIN WITH MINERALS) TABS tablet Take 1 tablet by mouth at bedtime.    . NONFORMULARY OR COMPOUNDED  ITEM Testosterone 1mg /0.55ml topical cream.  Apply 0.46ml three times weekly to thighs. Disp: 90 day supply. 1 each 1  . phenazopyridine (PYRIDIUM) 100 MG tablet Take 1 tablet (100 mg total) by mouth 3 (three) times daily as needed for pain. 10 tablet 0  . Simethicone (GAS-X EXTRA STRENGTH) 125 MG CAPS Take 125 mg by mouth at bedtime.    . sulfamethoxazole-trimethoprim (BACTRIM DS) 800-160 MG tablet Take 1 tablet by mouth 2 (two) times daily. 6 tablet 0  . trimethoprim (TRIMPEX) 100 MG tablet Take 1 tablet PO within 30 minutes  of intercourse PRN. 30 tablet 1  . valACYclovir (VALTREX) 1000 MG tablet TAKE 1 TABLET BY MOUTH EVERY DAY 30 tablet 7   No current facility-administered medications on file prior to visit.    BP 112/82 (BP Location: Right Arm, Patient Position: Sitting, Cuff Size: Small)   Pulse 78   Temp (!) 97.1 F (36.2 C) (Temporal)   Resp 16   Ht 5\' 1"  (1.549 m)   Wt 122 lb 12.8 oz (55.7 kg)   LMP 07/31/2017   SpO2 100%   BMI 23.20 kg/m       Objective:   Physical Exam  Physical Exam  Constitutional: She is oriented to person, place, and time. She appears well-developed and well-nourished. No distress.  HENT:  Head: Normocephalic and atraumatic.  Right Ear: Tympanic membrane and ear canal normal.  Left Ear: Tympanic membrane and ear canal normal.  Mouth/Throat: Not examined- pt wearing mask Eyes: Pupils are equal, round, and reactive to light. No scleral icterus.  Neck: Normal range of motion. No thyromegaly present.  Cardiovascular: Normal rate and regular rhythm.   No murmur heard. Pulmonary/Chest: Effort normal and breath sounds normal. No respiratory distress. He has no wheezes. She has no rales. She exhibits no tenderness.  Abdominal: Soft. Bowel sounds are normal. She exhibits no distension and no mass. There is no tenderness. There is no rebound and no guarding.  Musculoskeletal: She exhibits no edema.  Lymphadenopathy:    She has no cervical adenopathy.    Neurological: She is alert and oriented to person, place, and time. She has normal patellar reflexes. She exhibits normal muscle tone. Coordination normal.  Skin: Skin is warm and dry.  Psychiatric: She has a normal mood and affect. Her behavior is normal. Judgment and thought content normal.  Breast/pelvic: deferred        Assessment & Plan:    Preventative care- encouraged pt to continue healthy diet and regular exercise. Obtain routine lab work.   Hyperlipidemia- brings her lab work from former clinic noting elevated cholesterol. Will obtain follow up lipid panel.  Overactive bladder- will give trial of Detrol.    Insomnia- will give trial of prn trazodone.  Migraines- about once a week. Improve with use of prn relpax. She declines neurology referral at this time.      This visit occurred during the SARS-CoV-2 public health emergency.  Safety protocols were in place, including screening questions prior to the visit, additional usage of staff PPE, and extensive cleaning of exam room while observing appropriate contact time as indicated for disinfecting solutions.    Assessment & Plan:

## 2019-11-12 NOTE — Patient Instructions (Addendum)
Please schedule a routine eye exam.  Please begin Ditropan for your bladder. Begin trazodone 1/2 to 1 tab once daily.

## 2019-11-13 ENCOUNTER — Encounter: Payer: Self-pay | Admitting: Family

## 2019-11-13 DIAGNOSIS — E785 Hyperlipidemia, unspecified: Secondary | ICD-10-CM | POA: Insufficient documentation

## 2019-11-20 NOTE — Progress Notes (Signed)
39 y.o. G0P0000 Married White or Caucasian female here for annual exam.  She is changing jobs this month.  Is going to be at [employer] starting mid-April.  She's been vaccinated for Covid.    Denies vaginal bleeding.  Reports she continues to have some hot flashes.  She would like hormonal testing again today.  She is used topical testosterone and this helps with libido but she reports she is not faithful with using it.  Saw Dr. Tresa Moore for consultation regarding possible IC.  She had a CT that was negative.  She was given a long term rx for antibiotics when she feels a possible UTI.  She then saw Melissa, NP, and was prescribed oxybutynin and she felt did help.  She's not sure of the dosage.  She did have some dry eyes.    Having some hot flashes.    Patient's last menstrual period was 07/31/2017.          Sexually active: Yes.    The current method of family planning is status post hysterectomy.    Exercising: Yes.    weights & cardio Smoker:  no  Health Maintenance: Pap:  03-24-17 neg HPV HR neg History of abnormal Pap:  yes MMG:  08-01-16 category c density birads 1:neg Colonoscopy:  none BMD:   none TDaP:  2020 Hep C testing: neg 2018 Screening Labs: would like to have hormone testing today.   reports that she quit smoking about 9 years ago. She has never used smokeless tobacco. She reports current alcohol use of about 3.0 - 4.0 standard drinks of alcohol per week. She reports that she does not use drugs.  Past Medical History:  Diagnosis Date  . Abnormal Pap smear of cervix    2008 or 2009  . Anxiety   . Dental bridge present    lower front  . Dry eyes   . Endometriosis   . Headache    Migraines  . History of indigestion    treat with OTC  . Hypoglycemia   . Irritable bowel syndrome (IBS)   . Seasonal allergies   . Trigger thumb of right hand 11/2013    Past Surgical History:  Procedure Laterality Date  . CHOLECYSTECTOMY  06/2015  . CHROMOPERTUBATION N/A 05/30/2016   Procedure: CHROMOPERTUBATION;  Surgeon: Megan Salon, MD;  Location: [facility];  Service: Gynecology;  Laterality: N/A;  . COLPOSCOPY  2008  . CYSTOSCOPY N/A 08/21/2017   Procedure: CYSTOSCOPY;  Surgeon: Megan Salon, MD;  Location: [facility];  Service: Gynecology;  Laterality: N/A;  . LABIOPLASTY N/A 08/21/2017   Procedure: LABIAPLASTY;  Surgeon: Megan Salon, MD;  Location: [facility];  Service: Gynecology;  Laterality: N/A;  . LAPAROSCOPY N/A 05/30/2016   Procedure: LAPAROSCOPY OPERATIVE WITH LYSIS OF ADHESIONS, EXCISION OF POSSIBLE ENDOMETRIOSIS;  Surgeon: Megan Salon, MD;  Location: [facility];  Service: Gynecology;  Laterality: N/A;  . REFRACTIVE SURGERY Bilateral 02/2015  . TONSILLECTOMY  1988  . TOTAL LAPAROSCOPIC HYSTERECTOMY WITH SALPINGECTOMY Bilateral 08/21/2017   Procedure: TOTAL LAPAROSCOPIC HYSTERECTOMY WITH SALPINGECTOMY;  Surgeon: Megan Salon, MD;  Location: [facility];  Service: Gynecology;  Laterality: Bilateral;  . TRIGGER FINGER RELEASE Right 12/23/2013   Procedure: RIGHT THUMB TRIGGER RELEASE ;  Surgeon: Tennis Must, MD;  Location: [facility];  Service: Orthopedics;  Laterality: Right;    Current Outpatient Medications  Medication Sig Dispense Refill  . Carboxymethylcellulose Sod PF (REFRESH PLUS) 0.5 % SOLN Place 1 drop into  both eyes 2 (two) times daily.    . Cholecalciferol (VITAMIN D3) 2000 units TABS Take 2,000 Units by mouth at bedtime.    Marland Kitchen eletriptan (RELPAX) 40 MG tablet 1 TABLET AS NEEDED ONE TIME ONCE A DAY AS NEEDED FOR HEADACHES ORALLY 30 tablet 1  . MELATONIN ER PO Take by mouth.    . montelukast (SINGULAIR) 10 MG tablet Take 1 tablet (10 mg total) by mouth at bedtime. 90 tablet 1  . Multiple Vitamin (MULTIVITAMIN WITH MINERALS) TABS tablet Take 1 tablet by mouth at bedtime.    . NONFORMULARY OR COMPOUNDED ITEM Testosterone 1mg /0.34ml topical cream.  Apply 0.75ml three times weekly to thighs. Disp: 90 day supply. 1 each 1  . oxybutynin (DITROPAN-XL)  10 MG 24 hr tablet Take 1 tablet (10 mg total) by mouth at bedtime. 30 tablet 5  . Simethicone (GAS-X EXTRA STRENGTH) 125 MG CAPS Take 125 mg by mouth at bedtime.    . traZODone (DESYREL) 50 MG tablet 1/2 to 1 tablet by mouth at bedtime as needed 30 tablet 2  . valACYclovir (VALTREX) 1000 MG tablet TAKE 1 TABLET BY MOUTH EVERY DAY 30 tablet 7  . sulfamethoxazole-trimethoprim (BACTRIM DS) 800-160 MG tablet Take 1 tablet by mouth 2 (two) times daily. (Patient not taking: Reported on 11/26/2019) 6 tablet 0  . trimethoprim (TRIMPEX) 100 MG tablet Take 1 tablet PO within 30 minutes of intercourse PRN. (Patient not taking: Reported on 11/26/2019) 30 tablet 1   No current facility-administered medications for this visit.    Family History  Problem Relation Age of Onset  . Breast cancer Maternal Grandmother 28       died early 57's  . Hyperlipidemia Mother   . Heart disease Father        valve replacement, aortic aneurysm  . Cancer Paternal Grandfather        Jaw cancer, smoker    Review of Systems  Constitutional: Negative.   HENT: Negative.   Eyes: Negative.   Respiratory: Negative.   Cardiovascular: Negative.   Gastrointestinal: Negative.   Endocrine: Negative.   Genitourinary: Negative.   Musculoskeletal: Negative.   Skin: Negative.   Allergic/Immunologic: Negative.   Neurological: Negative.   Psychiatric/Behavioral: Negative.     Exam:   BP 118/72   Pulse 68   Temp 98.1 F (36.7 C) (Skin)   Resp 16   Ht 5' 2.5" (1.588 m)   Wt 122 lb (55.3 kg)   LMP 07/31/2017   BMI 21.96 kg/m     Height: 5' 2.5" (158.8 cm)  Ht Readings from Last 3 Encounters:  11/26/19 5' 2.5" (1.588 m)  11/12/19 5\' 1"  (1.549 m)  05/31/19 5\' 1"  (1.549 m)   General appearance: alert, cooperative and appears stated age Head: Normocephalic, without obvious abnormality, atraumatic Neck: no adenopathy, supple, symmetrical, trachea midline and thyroid normal to inspection and palpation Lungs: clear to  auscultation bilaterally Breasts: normal appearance, no masses or tenderness Heart: regular rate and rhythm Abdomen: soft, non-tender; bowel sounds normal; no masses,  no organomegaly Extremities: extremities normal, atraumatic, no cyanosis or edema Skin: Skin color, texture, turgor normal. No rashes or lesions Lymph nodes: Cervical, supraclavicular, and axillary nodes normal. No abnormal inguinal nodes palpated Neurologic: Grossly normal   Pelvic: External genitalia:  no lesions              Urethra:  normal appearing urethra with no masses, tenderness or lesions  Bartholins and Skenes: normal                 Vagina: normal appearing vagina with normal color and discharge, no lesions              Cervix: absent              Pap taken: No. Bimanual Exam:  Uterus:  uterus absent              Adnexa: normal adnexa and no mass, fullness, tenderness               Rectovaginal: Confirms               Anus:  normal sphincter tone, no lesions  Chaperone, Terence Lux, CMA, was present for exam.  A:  Well Woman with normal exam History of endometriosis Status post TLH with bilateral salpingectomy and cystoscopy 08/21/2017 Urinary urgency, questionable IC, saw urology last year but was not very satisfied with visit Decreased libido that is improved with testosterone use Family history of breast cancer maternal grandmother in her late 46s with increased lifetime risk of breast cancer of 20.4% calculated last year with the [employer] model.  Has declined genetic testing. History of HSV  P:   Mammogram guidelines reviewed.  She is can start 3D yearly mammograms with her birthday.  We have discussed breast MRI as well.  She not interested in this at this time pap smear not indicated Total testosterone, FSH, estradiol obtained today Will try oxybutynin extended release 5 mg daily.  Rx to pharmacy.  She will give an update in a month Refill for Valtrex 1 g daily.  She is going to  try half a tab daily for suppressive therapy to see if this works [facility] Klonopin 0.5 mg one half tab nightly as needed anxiety/insomnia.  Patient uses rarely.  #30/0 refills return annually or prn

## 2019-11-25 ENCOUNTER — Other Ambulatory Visit: Payer: Self-pay

## 2019-11-26 ENCOUNTER — Ambulatory Visit (INDEPENDENT_AMBULATORY_CARE_PROVIDER_SITE_OTHER): Payer: BC Managed Care – PPO | Admitting: Obstetrics & Gynecology

## 2019-11-26 ENCOUNTER — Other Ambulatory Visit: Payer: Self-pay

## 2019-11-26 ENCOUNTER — Encounter: Payer: Self-pay | Admitting: Obstetrics & Gynecology

## 2019-11-26 VITALS — BP 118/72 | HR 68 | Temp 98.1°F | Resp 16 | Ht 62.5 in | Wt 122.0 lb

## 2019-11-26 DIAGNOSIS — Z90710 Acquired absence of both cervix and uterus: Secondary | ICD-10-CM | POA: Diagnosis not present

## 2019-11-26 DIAGNOSIS — Z01419 Encounter for gynecological examination (general) (routine) without abnormal findings: Secondary | ICD-10-CM

## 2019-11-26 DIAGNOSIS — R6882 Decreased libido: Secondary | ICD-10-CM | POA: Diagnosis not present

## 2019-11-26 DIAGNOSIS — R232 Flushing: Secondary | ICD-10-CM

## 2019-11-26 MED ORDER — NONFORMULARY OR COMPOUNDED ITEM: 1 refills | Status: DC

## 2019-11-26 MED ORDER — OXYBUTYNIN CHLORIDE ER 5 MG PO TB24: 5.0000 mg | ORAL_TABLET | Freq: Every day | ORAL | 4 refills | Status: DC

## 2019-11-26 MED ORDER — VALACYCLOVIR HCL 1 G PO TABS: 1000.0000 mg | ORAL_TABLET | Freq: Every day | ORAL | 4 refills | Status: DC

## 2019-11-26 MED ORDER — CLONAZEPAM 0.5 MG PO TABS: ORAL_TABLET | ORAL | 0 refills | Status: DC

## 2019-11-26 NOTE — Patient Instructions (Signed)
Oxytrol patch  Let me know what the dosing was for the oral oxybutynin ER that you tried.

## 2019-11-27 DIAGNOSIS — Z90710 Acquired absence of both cervix and uterus: Secondary | ICD-10-CM | POA: Insufficient documentation

## 2019-11-30 LAB — ESTRADIOL: Estradiol: 62.2 pg/mL

## 2019-11-30 LAB — TESTOSTERONE, TOTAL, LC/MS/MS: Testosterone, total: 9.6 ng/dL — ABNORMAL LOW (ref 10.0–55.0)

## 2019-11-30 LAB — FOLLICLE STIMULATING HORMONE: FSH: 7.1 m[IU]/mL

## 2019-12-12 ENCOUNTER — Encounter: Payer: Self-pay | Admitting: Family

## 2019-12-13 ENCOUNTER — Encounter: Payer: Self-pay | Admitting: Internal Medicine

## 2019-12-13 ENCOUNTER — Other Ambulatory Visit: Payer: Self-pay

## 2019-12-13 ENCOUNTER — Ambulatory Visit: Payer: BC Managed Care – PPO | Admitting: Internal Medicine

## 2019-12-13 VITALS — BP 123/86 | HR 79 | Temp 98.0°F | Resp 18 | Ht 62.5 in | Wt 122.5 lb

## 2019-12-13 DIAGNOSIS — W57XXXA Bitten or stung by nonvenomous insect and other nonvenomous arthropods, initial encounter: Secondary | ICD-10-CM | POA: Diagnosis not present

## 2019-12-13 DIAGNOSIS — S30860A Insect bite (nonvenomous) of lower back and pelvis, initial encounter: Secondary | ICD-10-CM

## 2019-12-13 NOTE — Progress Notes (Signed)
Pre visit review using our clinic review tool, if applicable. No additional management support is needed unless otherwise documented below in the visit note. 

## 2019-12-13 NOTE — Patient Instructions (Signed)
Let us wait few more days to see how you are feeling.  If you continue to feel poorly in the next few days on and off let me know  You have fever, rash, severe muscle aches or joint aches let us know.  If needed will check Lyme serology  Please avoid further tick bites during the summer

## 2019-12-13 NOTE — Progress Notes (Signed)
Subjective:    Patient ID: [name], female    DOB: [birth year], 39 y.o.   MRN: [id number]  DOS:  12/13/2019 Type of visit - description: Acute 6 days ago, she took a hike [place] the woods (location: [facility]). The next day her husband found a tick at her back, it was possibly slightly engorged, it was removed with some difficulty. The area is still red.  She is here because in the last couple of days she is experiencing malaise. She had a mild headache different from her migraines Her temperature this check at work: No fever chills No arthralgias but some ache located at the upper back neck.    Review of Systems See above   Past Medical History:  Diagnosis Date  . Abnormal Pap smear of cervix    2008 or 2009  . Anxiety   . Dental bridge present    lower front  . Dry eyes   . Endometriosis   . Headache    Migraines  . History of indigestion    treat with OTC  . Hypoglycemia   . Irritable bowel syndrome (IBS)   . Seasonal allergies   . Trigger thumb of right hand 11/2013    Past Surgical History:  Procedure Laterality Date  . CHOLECYSTECTOMY  06/2015  . CHROMOPERTUBATION N/A 05/30/2016   Procedure: CHROMOPERTUBATION;  Surgeon: Megan Salon, MD;  Location: [facility];  Service: Gynecology;  Laterality: N/A;  . COLPOSCOPY  2008  . CYSTOSCOPY N/A 08/21/2017   Procedure: CYSTOSCOPY;  Surgeon: Megan Salon, MD;  Location: [facility];  Service: Gynecology;  Laterality: N/A;  . LABIOPLASTY N/A 08/21/2017   Procedure: LABIAPLASTY;  Surgeon: Megan Salon, MD;  Location: [facility];  Service: Gynecology;  Laterality: N/A;  . LAPAROSCOPY N/A 05/30/2016   Procedure: LAPAROSCOPY OPERATIVE WITH LYSIS OF ADHESIONS, EXCISION OF POSSIBLE ENDOMETRIOSIS;  Surgeon: Megan Salon, MD;  Location: [facility];  Service: Gynecology;  Laterality: N/A;  . REFRACTIVE SURGERY Bilateral 02/2015  . TONSILLECTOMY  1988  . TOTAL LAPAROSCOPIC HYSTERECTOMY WITH SALPINGECTOMY Bilateral 08/21/2017   Procedure: TOTAL LAPAROSCOPIC HYSTERECTOMY WITH SALPINGECTOMY;  Surgeon: Megan Salon, MD;  Location: [facility];  Service: Gynecology;  Laterality: Bilateral;  . TRIGGER FINGER RELEASE Right 12/23/2013   Procedure: RIGHT THUMB TRIGGER RELEASE ;  Surgeon: Tennis Must, MD;  Location: [facility];  Service: Orthopedics;  Laterality: Right;    Allergies as of 12/13/2019      Reactions   Other    Dermabond.  Topical blisters.      Medication List       Accurate as of December 13, 2019  3:22 PM. If you have any questions, ask your nurse or doctor.        clonazePAM 0.5 MG tablet Commonly known as: KLONOPIN TAKE 1/2  1 TABLET BY MOUTH AT BEDTIME   eletriptan 40 MG tablet Commonly known as: RELPAX 1 TABLET AS NEEDED ONE TIME ONCE A DAY AS NEEDED FOR HEADACHES ORALLY   Gas-X Extra Strength 125 MG Caps Generic drug: Simethicone Take 125 mg by mouth at bedtime.   MELATONIN ER PO Take by mouth.   montelukast 10 MG tablet Commonly known as: SINGULAIR Take 1 tablet (10 mg total) by mouth at bedtime.   multivitamin with minerals Tabs tablet Take 1 tablet by mouth at bedtime.   NONFORMULARY OR COMPOUNDED ITEM Testosterone 1mg /0.18ml topical cream.  Apply 0.79ml three times weekly to thighs. Disp: 90 day  supply.   oxybutynin 5 MG 24 hr tablet Commonly known as: DITROPAN-XL Take 1 tablet (5 mg total) by mouth at bedtime.   Refresh Plus 0.5 % Soln Generic drug: Carboxymethylcellulose Sod PF Place 1 drop into both eyes 2 (two) times daily.   sulfamethoxazole-trimethoprim 800-160 MG tablet Commonly known as: BACTRIM DS Take 1 tablet by mouth 2 (two) times daily.   traZODone 50 MG tablet Commonly known as: DESYREL 1/2 to 1 tablet by mouth at bedtime as needed   valACYclovir 1000 MG tablet Commonly known as: VALTREX Take 1 tablet (1,000 mg total) by mouth daily.   Vitamin D3 50 MCG (2000 UT) Tabs Take 2,000 Units by mouth at bedtime.          Objective:    Physical Exam Skin:        BP 123/86 (BP Location: Left Arm, Patient Position: Sitting, Cuff Size: Small)   Pulse 79   Temp 98 F (36.7 C) (Temporal)   Resp 18   Ht 5' 2.5" (1.588 m)   Wt 122 lb 8 oz (55.6 kg)   LMP 07/31/2017   SpO2 98%   BMI 22.05 kg/m  General:   Well developed, NAD, healthy-appearing, BMI noted. HEENT:  Normocephalic . Face symmetric, atraumatic  Skin: Not pale. Not jaundice. Neurologic:  alert & oriented X3.  Speech normal, gait appropriate for age and unassisted Psych--  Cognition and judgment appear intact.  Cooperative with normal attention span and concentration.  Behavior appropriate. No anxious or depressed appearing.      Assessment    39 year old female, PMH includes multiple surgeries, IBS, headaches, presents with    Tick bite: Patient had a tick bite while hiking [place] [facility].  Rash is not c/w Lyme disease, symptoms are nonspecific, she looks healthy, vital signs stable. We talked about options including observation, empiric treatment with 1 week of antibiotics, testing in few days. We agreed to observe her symptoms, if she feels poorly on and off she will simply call and we will order lyme serology. If she has severe symptoms needs to seek medical attention. I stressed the need for prevention of tick bites when hiking. She verbalized understanding and agreement with the plan  This visit occurred during the SARS-CoV-2 public health emergency.  Safety protocols were in place, including screening questions prior to the visit, additional usage of staff PPE, and extensive cleaning of exam room while observing appropriate contact time as indicated for disinfecting solutions.

## 2019-12-24 ENCOUNTER — Other Ambulatory Visit: Payer: Self-pay

## 2019-12-24 ENCOUNTER — Encounter: Payer: Self-pay | Admitting: Family

## 2019-12-24 ENCOUNTER — Ambulatory Visit: Payer: BC Managed Care – PPO | Admitting: Family

## 2019-12-24 VITALS — BP 115/73 | HR 81 | Temp 98.4°F | Resp 16 | Ht 62.5 in | Wt 116.0 lb

## 2019-12-24 DIAGNOSIS — E785 Hyperlipidemia, unspecified: Secondary | ICD-10-CM | POA: Diagnosis not present

## 2019-12-24 DIAGNOSIS — G4700 Insomnia, unspecified: Secondary | ICD-10-CM | POA: Diagnosis not present

## 2019-12-24 DIAGNOSIS — N3281 Overactive bladder: Secondary | ICD-10-CM | POA: Diagnosis not present

## 2019-12-24 MED ORDER — ZOLPIDEM TARTRATE 5 MG PO TABS: 5.0000 mg | ORAL_TABLET | Freq: Every evening | ORAL | 0 refills | Status: DC | PRN

## 2019-12-24 NOTE — Progress Notes (Signed)
Subjective:    Patient ID: [name], female    DOB: 08/13/1981, 39 y.o.   MRN: [id number]  HPI  Patient is a 39 yr old female who presents today for follow up.  Insomnia- last visit we gave her a trial of trazodone.  She reports that she d/c'd trazodone and her GYN started her on Klonopin.  She states that she worries a lot and is having a lot of stress. She is in a new job that she likes but she is adjusting to. She states that her husband works as a Engineer, structural and she is very worried about him and his job with the recent events the last few months.   OAB- last visit we gave her a trial of Detrol. She discontinued due to side effects. Reports that it caused dry eye and dry mouth. GYN dropped to 5mg . Did not help.    Review of Systems    see HPI  Past Medical History:  Diagnosis Date  . Abnormal Pap smear of cervix    2008 or 2009  . Anxiety   . Dental bridge present    lower front  . Dry eyes   . Endometriosis   . Headache    Migraines  . History of indigestion    treat with OTC  . Hypoglycemia   . Irritable bowel syndrome (IBS)   . Seasonal allergies   . Trigger thumb of right hand 11/2013     Social History   Socioeconomic History  . Marital status: Married    Spouse name: Not on file  . Number of children: Not on file  . Years of education: Not on file  . Highest education level: Not on file  Occupational History  . Occupation: Surgical tech  Tobacco Use  . Smoking status: Former Smoker    Quit date: 08/22/2010    Years since quitting: 9.3  . Smokeless tobacco: Never Used  Substance and Sexual Activity  . Alcohol use: Yes    Alcohol/week: 3.0 - 4.0 standard drinks    Types: 3 - 4 Standard drinks or equivalent per week    Comment: per week  . Drug use: No  . Sexual activity: Yes    Partners: Male    Birth control/protection: Surgical    Comment: [facility]  Other Topics Concern  . Not on file  Social History Narrative   She is surgical tech in the  OR   Married   No children   American Bully    Enjoys hiking with dog, exercise for stress relief   Social Determinants of Health   Financial Resource Strain: Low Risk   . Difficulty of Paying Living Expenses: Not hard at all  Food Insecurity: No Food Insecurity  . Worried About Charity fundraiser in the Last Year: Never true  . Ran Out of Food in the Last Year: Never true  Transportation Needs: No Transportation Needs  . Lack of Transportation (Medical): No  . Lack of Transportation (Non-Medical): No  Physical Activity: Sufficiently Active  . Days of Exercise per Week: 4 days  . Minutes of Exercise per Session: 60 min  Stress:   . Feeling of Stress :   Social Connections: Somewhat Isolated  . Frequency of Communication with Friends and Family: More than three times a week  . Frequency of Social Gatherings with Friends and Family: Once a week  . Attends Religious Services: Never  . Active Member of Clubs or Organizations: No  .  Attends Archivist Meetings: Never  . Marital Status: Married  Human resources officer Violence: Not At Risk  . Fear of Current or Ex-Partner: No  . Emotionally Abused: No  . Physically Abused: No  . Sexually Abused: No    Past Surgical History:  Procedure Laterality Date  . CHOLECYSTECTOMY  06/2015  . CHROMOPERTUBATION N/A 05/30/2016   Procedure: CHROMOPERTUBATION;  Surgeon: Megan Salon, MD;  Location: [facility];  Service: Gynecology;  Laterality: N/A;  . COLPOSCOPY  2008  . CYSTOSCOPY N/A 08/21/2017   Procedure: CYSTOSCOPY;  Surgeon: Megan Salon, MD;  Location: [facility];  Service: Gynecology;  Laterality: N/A;  . LABIOPLASTY N/A 08/21/2017   Procedure: LABIAPLASTY;  Surgeon: Megan Salon, MD;  Location: [facility];  Service: Gynecology;  Laterality: N/A;  . LAPAROSCOPY N/A 05/30/2016   Procedure: LAPAROSCOPY OPERATIVE WITH LYSIS OF ADHESIONS, EXCISION OF POSSIBLE ENDOMETRIOSIS;  Surgeon: Megan Salon, MD;  Location: [facility];  Service:  Gynecology;  Laterality: N/A;  . REFRACTIVE SURGERY Bilateral 02/2015  . TONSILLECTOMY  1988  . TOTAL LAPAROSCOPIC HYSTERECTOMY WITH SALPINGECTOMY Bilateral 08/21/2017   Procedure: TOTAL LAPAROSCOPIC HYSTERECTOMY WITH SALPINGECTOMY;  Surgeon: Megan Salon, MD;  Location: [facility];  Service: Gynecology;  Laterality: Bilateral;  . TRIGGER FINGER RELEASE Right 12/23/2013   Procedure: RIGHT THUMB TRIGGER RELEASE ;  Surgeon: Tennis Must, MD;  Location: [facility];  Service: Orthopedics;  Laterality: Right;    Family History  Problem Relation Age of Onset  . Breast cancer Maternal Grandmother 28       died early 66's  . Hyperlipidemia Mother   . Heart disease Father        valve replacement, aortic aneurysm  . Cancer Paternal Grandfather        Jaw cancer, smoker    Allergies  Allergen Reactions  . Other     Dermabond.  Topical blisters.    Current Outpatient Medications on File Prior to Visit  Medication Sig Dispense Refill  . Carboxymethylcellulose Sod PF (REFRESH PLUS) 0.5 % SOLN Place 1 drop into both eyes 2 (two) times daily.    . Cholecalciferol (VITAMIN D3) 2000 units TABS Take 2,000 Units by mouth at bedtime.    Marland Kitchen eletriptan (RELPAX) 40 MG tablet 1 TABLET AS NEEDED ONE TIME ONCE A DAY AS NEEDED FOR HEADACHES ORALLY 30 tablet 1  . MELATONIN ER PO Take by mouth.    . montelukast (SINGULAIR) 10 MG tablet Take 1 tablet (10 mg total) by mouth at bedtime. 90 tablet 1  . Multiple Vitamin (MULTIVITAMIN WITH MINERALS) TABS tablet Take 1 tablet by mouth at bedtime.    . NONFORMULARY OR COMPOUNDED ITEM Testosterone 1mg /0.63ml topical cream.  Apply 0.39ml three times weekly to thighs. Disp: 90 day supply. 1 each 1  . oxybutynin (DITROPAN-XL) 5 MG 24 hr tablet Take 1 tablet (5 mg total) by mouth at bedtime. 90 tablet 4  . Simethicone (GAS-X EXTRA STRENGTH) 125 MG CAPS Take 125 mg by mouth at bedtime.    . traZODone (DESYREL) 50 MG tablet 1/2 to 1 tablet by mouth at bedtime as  needed 30 tablet 2  . valACYclovir (VALTREX) 1000 MG tablet Take 1 tablet (1,000 mg total) by mouth daily. 90 tablet 4   No current facility-administered medications on file prior to visit.    BP 115/73 (BP Location: Right Arm, Patient Position: Sitting, Cuff Size: Small)   Pulse 81   Temp 98.4 F (36.9 C) (Temporal)  Resp 16   Ht 5' 2.5" (1.588 m)   Wt 116 lb (52.6 kg)   LMP 07/31/2017   SpO2 100%   BMI 20.88 kg/m    Objective:   Physical Exam Constitutional:      Appearance: She is well-developed.  Neck:     Thyroid: No thyromegaly.  Cardiovascular:     Rate and Rhythm: Normal rate and regular rhythm.     Heart sounds: Normal heart sounds. No murmur.  Pulmonary:     Effort: Pulmonary effort is normal. No respiratory distress.     Breath sounds: Normal breath sounds. No wheezing.  Musculoskeletal:     Cervical back: Neck supple.  Skin:    General: Skin is warm and dry.  Neurological:     Mental Status: She is alert and oriented to person, place, and time.  Psychiatric:        Behavior: Behavior normal.        Thought Content: Thought content normal.        Judgment: Judgment normal.           Assessment & Plan:  Insomnia- d/c klonopin, rx with prn ambien. UDS is ordered today and controlled substance contract is signed.   Overactive bladder- did not tolerate detrol.  Reports symptoms are tolerable and she may consider consult with urogyn in the future.  Hyperlipidemia- reviewed lipid panel with her.  LDL 159, but HDL >90.  Continue healthy diet and regular exercise.  This visit occurred during the SARS-CoV-2 public health emergency.  Safety protocols were in place, including screening questions prior to the visit, additional usage of staff PPE, and extensive cleaning of exam room while observing appropriate contact time as indicated for disinfecting solutions.

## 2019-12-24 NOTE — Patient Instructions (Addendum)
Please stop klonopin. You may use ambien as needed. Send me a note in a few weeks to let me know how you are doing on the [place].

## 2019-12-28 LAB — DRUG MONITORING, PANEL 8 WITH CONFIRMATION, URINE
6 Acetylmorphine: NEGATIVE ng/mL (ref <10)
Alcohol Metabolites: NEGATIVE ng/mL
Amphetamines: NEGATIVE ng/mL (ref <500)
Benzodiazepines: NEGATIVE ng/mL (ref <100)
Buprenorphine, Urine: NEGATIVE ng/mL (ref <5)
Cocaine Metabolite: NEGATIVE ng/mL (ref <150)
Creatinine: 86.3 mg/dL
Ethyl Glucuronide (ETG): NEGATIVE ng/mL (ref <500)
Ethyl Sulfate (ETS): NEGATIVE ng/mL (ref <100)
MDMA: NEGATIVE ng/mL (ref <500)
Marijuana Metabolite: NEGATIVE ng/mL (ref <20)
Opiates: NEGATIVE ng/mL (ref <100)
Oxidant: NEGATIVE ug/mL
Oxycodone: NEGATIVE ng/mL (ref <100)
pH: 5.7 (ref 4.5–9.0)

## 2019-12-28 LAB — DM TEMPLATE

## 2020-02-11 ENCOUNTER — Ambulatory Visit: Payer: BLUE CROSS/BLUE SHIELD | Admitting: Obstetrics & Gynecology

## 2020-02-14 DIAGNOSIS — D2262 Melanocytic nevi of left upper limb, including shoulder: Secondary | ICD-10-CM | POA: Diagnosis not present

## 2020-02-14 DIAGNOSIS — D2261 Melanocytic nevi of right upper limb, including shoulder: Secondary | ICD-10-CM | POA: Diagnosis not present

## 2020-02-14 DIAGNOSIS — D2239 Melanocytic nevi of other parts of face: Secondary | ICD-10-CM | POA: Diagnosis not present

## 2020-02-14 DIAGNOSIS — L720 Epidermal cyst: Secondary | ICD-10-CM | POA: Diagnosis not present

## 2020-02-18 ENCOUNTER — Encounter: Payer: BC Managed Care – PPO | Admitting: Family

## 2020-03-07 ENCOUNTER — Other Ambulatory Visit: Payer: Self-pay | Admitting: Family

## 2020-03-29 DIAGNOSIS — L259 Unspecified contact dermatitis, unspecified cause: Secondary | ICD-10-CM | POA: Diagnosis not present

## 2020-03-29 DIAGNOSIS — L237 Allergic contact dermatitis due to plants, except food: Secondary | ICD-10-CM | POA: Diagnosis not present

## 2020-03-31 DIAGNOSIS — L237 Allergic contact dermatitis due to plants, except food: Secondary | ICD-10-CM | POA: Diagnosis not present

## 2020-06-11 ENCOUNTER — Telehealth: Payer: Self-pay | Admitting: Family

## 2020-06-11 NOTE — Telephone Encounter (Signed)
Medication: montelukast (SINGULAIR) 10 MG tablet  Has the patient contacted their pharmacy? No. (If no, request that the patient contact the pharmacy for the refill.) (If yes, when and what did the pharmacy advise?)  Preferred Pharmacy (with phone number or street name):  [facility] [place] [address] Telephone : [phone]  Agent: Please be advised that RX refills may take up to 3 business days. We ask that you follow-up with your pharmacy.

## 2020-06-12 MED ORDER — MONTELUKAST SODIUM 10 MG PO TABS: 10.0000 mg | ORAL_TABLET | Freq: Every day | ORAL | 1 refills | Status: DC

## 2020-06-12 NOTE — Telephone Encounter (Signed)
Medication sent.

## 2020-06-30 ENCOUNTER — Other Ambulatory Visit: Payer: Self-pay | Admitting: Family

## 2020-07-06 ENCOUNTER — Encounter: Payer: Self-pay | Admitting: Family

## 2020-07-07 ENCOUNTER — Other Ambulatory Visit: Payer: Self-pay

## 2020-07-07 ENCOUNTER — Telehealth (INDEPENDENT_AMBULATORY_CARE_PROVIDER_SITE_OTHER): Payer: PRIVATE HEALTH INSURANCE | Admitting: Family

## 2020-07-07 DIAGNOSIS — N3281 Overactive bladder: Secondary | ICD-10-CM | POA: Diagnosis not present

## 2020-07-07 DIAGNOSIS — G4700 Insomnia, unspecified: Secondary | ICD-10-CM | POA: Diagnosis not present

## 2020-07-07 DIAGNOSIS — G43909 Migraine, unspecified, not intractable, without status migrainosus: Secondary | ICD-10-CM

## 2020-07-07 MED ORDER — CYCLOBENZAPRINE HCL 5 MG PO TABS
5.0000 mg | ORAL_TABLET | Freq: Every day | ORAL | 1 refills | Status: DC | PRN
Start: 2020-07-07 — End: 2021-02-03

## 2020-07-07 MED ORDER — ZOLPIDEM TARTRATE 5 MG PO TABS: 5.0000 mg | ORAL_TABLET | Freq: Every evening | ORAL | 0 refills | Status: DC | PRN

## 2020-07-07 MED ORDER — FISH OIL 1000 MG PO CAPS: 1.0000 | ORAL_CAPSULE | Freq: Every day | ORAL | 0 refills | Status: DC

## 2020-07-07 NOTE — Progress Notes (Signed)
Virtual Visit via Video Note  I connected with [name] on 07/07/20 at  3:40 PM EST by a video enabled telemedicine application and verified that I am speaking with the correct person using two identifiers.  Location: Patient: home Provider: work   I discussed the limitations of evaluation and management by telemedicine and the availability of in person appointments. The patient expressed understanding and agreed to proceed. Only the patient and myself were present for today's video call.   History of Present Illness:  Patient is a 39 yr old female who presents today for follow up.   Insomnia- gave trial of ambien last visit.  Uses prn. Uses melatonin on other nights.    OAB- reports that this is a little bit better, less urgency. Was treated for a UTI 2 weeks ago- used macrobid that she had on hand.    Migraines- Continues relpax on a prn basis. Had more frequent migraines over the summer. She had associated neck pain and tightness/occipital pain with her migraines.  Notes some tightness in her neck.  Did try using acupunture which was helpful.      Observations/Objective:   Gen: Awake, alert, no acute distress Resp: Breathing is even and non-labored Psych: calm/pleasant demeanor Neuro: Alert and Oriented x 3, + facial symmetry, speech is clear.   Assessment and Plan:  Migraines- has associated neck pain/muscle tightness. Will add prn flexeril.  OAB- improved. Monitor.  Insomnia- some better with ambien. Reinforced good sleep hygiene. Continue ambien prn.   Follow Up Instructions:    I discussed the assessment and treatment plan with the patient. The patient was provided an opportunity to ask questions and all were answered. The patient agreed with the plan and demonstrated an understanding of the instructions.   The patient was advised to call back or seek an in-person evaluation if the symptoms worsen or if the condition fails to improve as  anticipated.  Nance Pear, NP  e

## 2020-08-31 ENCOUNTER — Other Ambulatory Visit: Payer: Self-pay | Admitting: Family

## 2020-08-31 NOTE — Telephone Encounter (Signed)
Requesting:Zoloft 5mg  Contract:12/24/19 UDS:12/24/19 Last Visit:07/07/20 video visit Next Visit:n/a Last Refill:07/07/20  Please Advise

## 2020-10-09 ENCOUNTER — Other Ambulatory Visit: Payer: Self-pay | Admitting: Family

## 2020-10-12 LAB — HM MAMMOGRAPHY

## 2020-12-01 ENCOUNTER — Other Ambulatory Visit: Payer: Self-pay

## 2020-12-01 ENCOUNTER — Other Ambulatory Visit: Payer: Self-pay | Admitting: Family

## 2020-12-01 ENCOUNTER — Ambulatory Visit (INDEPENDENT_AMBULATORY_CARE_PROVIDER_SITE_OTHER): Payer: PRIVATE HEALTH INSURANCE | Admitting: Obstetrics & Gynecology

## 2020-12-01 ENCOUNTER — Other Ambulatory Visit (HOSPITAL_BASED_OUTPATIENT_CLINIC_OR_DEPARTMENT_OTHER)
Admission: RE | Admit: 2020-12-01 | Discharge: 2020-12-01 | Disposition: A | Discharge status: Home / Self Care | Payer: PRIVATE HEALTH INSURANCE | Source: Ambulatory Visit | Attending: Family | Admitting: Family

## 2020-12-01 ENCOUNTER — Ambulatory Visit (HOSPITAL_BASED_OUTPATIENT_CLINIC_OR_DEPARTMENT_OTHER): Payer: PRIVATE HEALTH INSURANCE | Admitting: Obstetrics & Gynecology

## 2020-12-01 ENCOUNTER — Other Ambulatory Visit (HOSPITAL_BASED_OUTPATIENT_CLINIC_OR_DEPARTMENT_OTHER): Payer: Self-pay

## 2020-12-01 ENCOUNTER — Encounter (HOSPITAL_BASED_OUTPATIENT_CLINIC_OR_DEPARTMENT_OTHER): Payer: Self-pay | Admitting: Obstetrics & Gynecology

## 2020-12-01 VITALS — BP 119/83 | HR 77 | Ht 61.25 in | Wt 115.0 lb

## 2020-12-01 DIAGNOSIS — R3915 Urgency of urination: Secondary | ICD-10-CM

## 2020-12-01 DIAGNOSIS — Z9189 Other specified personal risk factors, not elsewhere classified: Secondary | ICD-10-CM

## 2020-12-01 DIAGNOSIS — Z90710 Acquired absence of both cervix and uterus: Secondary | ICD-10-CM

## 2020-12-01 DIAGNOSIS — B009 Herpesviral infection, unspecified: Secondary | ICD-10-CM

## 2020-12-01 DIAGNOSIS — Z01419 Encounter for gynecological examination (general) (routine) without abnormal findings: Secondary | ICD-10-CM

## 2020-12-01 DIAGNOSIS — G43109 Migraine with aura, not intractable, without status migrainosus: Secondary | ICD-10-CM

## 2020-12-01 DIAGNOSIS — R6882 Decreased libido: Secondary | ICD-10-CM

## 2020-12-01 MED ORDER — NONFORMULARY OR COMPOUNDED ITEM: 1 refills | Status: DC

## 2020-12-01 MED ORDER — MIRABEGRON ER 25 MG PO TB24: 25.0000 mg | ORAL_TABLET | Freq: Every day | ORAL | 12 refills | Status: DC

## 2020-12-01 MED ORDER — VALACYCLOVIR HCL 1 G PO TABS: 1000.0000 mg | ORAL_TABLET | Freq: Every day | ORAL | 4 refills | Status: DC

## 2020-12-01 NOTE — Progress Notes (Addendum)
40 y.o. G0P0000 Married White or Caucasian female here for annual exam.  Doing well.  Working now with a [facility] outpatient facility in [place].    Denies vaginal bleeding.     Having a lot more issues with headaches.  Feels like the uses Relpax "all the time".  Feels like she had 10 or 12 a month.  Did have the daith ear piercing for headaches to see if this will help.  Just had it done this weekend.  H/o urinary urgency.  Had CT scan and ultrasound.  Last UTI was 06/2020.  Had been a year prior to that but is having some mild symptoms today.    Reports testosterone helps libido if she will just use it regularly.  She hasn't been lately.  Patient's last menstrual period was 07/31/2017.          Sexually active: Yes.    The current method of family planning is status post hysterectomy.    Exercising: Yes.     weight and cardio Smoker:  no  Health Maintenance: Pap:  Not indicated History of abnormal Pap:  2008 with resolution of pap MMG:  2022 at [facility] Colonoscopy:  Guidelines reviewed TDaP:  2020 Screening Labs: will do this summer with PCP   reports that she quit smoking about 10 years ago. She has never used smokeless tobacco. She reports current alcohol use of about 3.0 - 4.0 standard drinks of alcohol per week. She reports that she does not use drugs.  Past Medical History:  Diagnosis Date   Abnormal Pap smear of cervix    2008 or 2009   Anxiety    Dental bridge present    lower front   Dry eyes    Endometriosis    Headache    Migraines   History of indigestion    treat with OTC   Hypoglycemia    Irritable bowel syndrome (IBS)    Seasonal allergies    Trigger thumb of right hand 11/2013    Past Surgical History:  Procedure Laterality Date   CHOLECYSTECTOMY  06/2015   CHROMOPERTUBATION N/A 05/30/2016   Procedure: CHROMOPERTUBATION;  Surgeon: Megan Salon, MD;  Location: [facility];  Service: Gynecology;  Laterality: N/A;   COLPOSCOPY  2008    CYSTOSCOPY N/A 08/21/2017   Procedure: CYSTOSCOPY;  Surgeon: Megan Salon, MD;  Location: [facility];  Service: Gynecology;  Laterality: N/A;   LABIOPLASTY N/A 08/21/2017   Procedure: LABIAPLASTY;  Surgeon: Megan Salon, MD;  Location: [facility];  Service: Gynecology;  Laterality: N/A;   LAPAROSCOPY N/A 05/30/2016   Procedure: LAPAROSCOPY OPERATIVE WITH LYSIS OF ADHESIONS, EXCISION OF POSSIBLE ENDOMETRIOSIS;  Surgeon: Megan Salon, MD;  Location: [facility];  Service: Gynecology;  Laterality: N/A;   REFRACTIVE SURGERY Bilateral 02/2015   TONSILLECTOMY  1988   TOTAL LAPAROSCOPIC HYSTERECTOMY WITH SALPINGECTOMY Bilateral 08/21/2017   Procedure: TOTAL LAPAROSCOPIC HYSTERECTOMY WITH SALPINGECTOMY;  Surgeon: Megan Salon, MD;  Location: [facility];  Service: Gynecology;  Laterality: Bilateral;   TRIGGER FINGER RELEASE Right 12/23/2013   Procedure: RIGHT THUMB TRIGGER RELEASE ;  Surgeon: Tennis Must, MD;  Location: [facility];  Service: Orthopedics;  Laterality: Right;    Current Outpatient Medications  Medication Sig Dispense Refill   Carboxymethylcellulose Sod PF 0.5 % SOLN Place 1 drop into both eyes 2 (two) times daily.     Cholecalciferol (VITAMIN D3) 2000 units TABS Take 2,000 Units by mouth at bedtime.  cyclobenzaprine (FLEXERIL) 5 MG tablet Take 1 tablet (5 mg total) by mouth daily as needed for muscle spasms. 30 tablet 1   eletriptan (RELPAX) 40 MG tablet Take 1 tablet (40 mg total) by mouth one time as needed for headaches. 30 tablet 1   MELATONIN ER PO Take by mouth.     montelukast (SINGULAIR) 10 MG tablet TAKE 1 TABLET BY MOUTH EVERYDAY AT BEDTIME 90 tablet 0   Multiple Vitamin (MULTIVITAMIN WITH MINERALS) TABS tablet Take 1 tablet by mouth at bedtime.     NONFORMULARY OR COMPOUNDED ITEM Testosterone 1mg /0.18ml topical cream.  Apply 0.47ml three times weekly to thighs. Disp: 90 day supply. 1 each 1   Omega-3 Fatty Acids (FISH OIL) 1000 MG CAPS Take 1 capsule (1,000 mg total) by  mouth daily. 30 capsule 0   Simethicone 125 MG CAPS Take 125 mg by mouth at bedtime.     valACYclovir (VALTREX) 1000 MG tablet Take 1 tablet (1,000 mg total) by mouth daily. 90 tablet 4   zolpidem (AMBIEN) 5 MG tablet TAKE 1 TABLET BY MOUTH AT BEDTIME AS NEEDED FOR SLEEP. (Patient not taking: Reported on 12/01/2020) 30 tablet 0   No current facility-administered medications for this visit.    Family History  Problem Relation Age of Onset   Breast cancer Maternal Grandmother 45       died early 51's   Hyperlipidemia Mother    Heart disease Father        valve replacement, aortic aneurysm   Cancer Paternal Grandfather        Jaw cancer, smoker    Review of Systems  All other systems reviewed and are negative.   Exam:   BP 119/83   Pulse 77   Ht 5' 1.25" (1.556 m)   Wt 115 lb (52.2 kg)   LMP 07/31/2017   BMI 21.55 kg/m   Height: 5' 1.25" (155.6 cm)  General appearance: alert, cooperative and appears stated age Head: Normocephalic, without obvious abnormality, atraumatic Neck: no adenopathy, supple, symmetrical, trachea midline and thyroid normal to inspection and palpation Lungs: clear to auscultation bilaterally Breasts: normal appearance, no masses or tenderness Heart: regular rate and rhythm Abdomen: soft, non-tender; bowel sounds normal; no masses,  no organomegaly Extremities: extremities normal, atraumatic, no cyanosis or edema Skin: Skin color, texture, turgor normal. No rashes or lesions Lymph nodes: Cervical, supraclavicular, and axillary nodes normal. No abnormal inguinal nodes palpated Neurologic: Grossly normal   Pelvic: External genitalia:  no lesions              Urethra:  normal appearing urethra with no masses, tenderness or lesions              Bartholins and Skenes: normal                 Vagina: normal appearing vagina with normal color and discharge, no lesions              Cervix: absent              Pap taken: No. Bimanual Exam:  Uterus:  uterus  absent              Adnexa: no mass, fullness, tenderness               Rectovaginal: Confirms               Anus:  normal sphincter tone, no lesions  Chaperone, Shela Nevin, RN, was present for exam.  Assessment/Plan:  1. Well woman exam with routine gynecological exam - pap smear not indicated - has started breast MMG.  Done 10/12/2020. - colon cancer screening guidelines  - lab work done with PCP  2. Urinary urgency - mirabegron ER (MYRBETRIQ) 25 MG TB24 tablet; Take 1 tablet (25 mg total) by mouth daily. One po qd  Dispense: 30 tablet; Refill: 12 - Urine Culture; Future  3. Migraine with aura and without status migrainosus, not intractable - Ambulatory referral to Neurology  4. Increased risk of breast cancer (Tyrer Cusick model calculation of 20.4% lifetime risk) - declined genetic testing last year - will consider starting breast MRI  5. H/O: hysterectomy  6. HSV-2 (herpes simplex virus 2) infection - valACYclovir (VALTREX) 1000 MG tablet; Take 1 tablet (1,000 mg total) by mouth daily.  Dispense: 90 tablet; Refill: 4  7. Decreased libido - NONFORMULARY OR COMPOUNDED ITEM; Testosterone 1mg /0.106ml topical cream.  Apply 0.68ml three times weekly to thighs. Disp: 90 day supply.  Dispense: 1 each; Refill: 1

## 2020-12-02 ENCOUNTER — Encounter: Payer: Self-pay | Admitting: Neurology

## 2020-12-02 ENCOUNTER — Encounter: Payer: Self-pay | Admitting: Family

## 2020-12-02 MED ORDER — MONTELUKAST SODIUM 10 MG PO TABS: 10.0000 mg | ORAL_TABLET | Freq: Every day | ORAL | 3 refills | Status: DC

## 2020-12-03 ENCOUNTER — Other Ambulatory Visit (HOSPITAL_BASED_OUTPATIENT_CLINIC_OR_DEPARTMENT_OTHER): Payer: Self-pay | Admitting: Obstetrics & Gynecology

## 2020-12-03 ENCOUNTER — Encounter (HOSPITAL_BASED_OUTPATIENT_CLINIC_OR_DEPARTMENT_OTHER): Payer: Self-pay

## 2020-12-03 MED ORDER — SULFAMETHOXAZOLE-TRIMETHOPRIM 800-160 MG PO TABS: 1.0000 | ORAL_TABLET | Freq: Two times a day (BID) | ORAL | 0 refills | Status: DC

## 2020-12-04 LAB — URINE CULTURE: Culture: 60000 — AB

## 2020-12-05 ENCOUNTER — Other Ambulatory Visit (HOSPITAL_BASED_OUTPATIENT_CLINIC_OR_DEPARTMENT_OTHER): Payer: Self-pay | Admitting: Obstetrics & Gynecology

## 2020-12-05 DIAGNOSIS — N3090 Cystitis, unspecified without hematuria: Secondary | ICD-10-CM

## 2020-12-07 ENCOUNTER — Other Ambulatory Visit (HOSPITAL_BASED_OUTPATIENT_CLINIC_OR_DEPARTMENT_OTHER)
Admission: RE | Admit: 2020-12-07 | Discharge: 2020-12-07 | Disposition: A | Discharge status: Home / Self Care | Payer: PRIVATE HEALTH INSURANCE | Source: Ambulatory Visit | Attending: Obstetrics & Gynecology | Admitting: Obstetrics & Gynecology

## 2020-12-07 ENCOUNTER — Other Ambulatory Visit: Payer: Self-pay

## 2020-12-07 ENCOUNTER — Ambulatory Visit: Payer: BC Managed Care – PPO

## 2020-12-07 DIAGNOSIS — N3090 Cystitis, unspecified without hematuria: Secondary | ICD-10-CM

## 2020-12-08 LAB — URINE CULTURE: Culture: NO GROWTH

## 2020-12-09 ENCOUNTER — Ambulatory Visit (HOSPITAL_BASED_OUTPATIENT_CLINIC_OR_DEPARTMENT_OTHER): Payer: PRIVATE HEALTH INSURANCE | Admitting: Obstetrics & Gynecology

## 2021-02-02 NOTE — Progress Notes (Signed)
NEUROLOGY CONSULTATION NOTE  DHALIA Riddle MRN: [id number] DOB: Nov 07, 1980  Referring provider: Debbrah Alar, NP Primary care provider: Debbrah Alar, NP  Reason for consult:  headaches  Assessment/Plan:   Migraine without aura, without status migrainosus, not intractable Migraine with aura   Migraine prevention:  Start Qulipta 60mg  daily.  If no improvement in 3 months, will likely start propranolol Migraine rescue:  She will try Robaxin for onset of neck pain; she will try Ubrelvy 100mg  for migraine headache and Zofran for nausea.  If effective, will send prescription for Ubrelvy Lifestyle modification:  caffeine cessation, increase water intake, monitor for food triggers Keep headache diary Consider magnesium citrate 400mg /riboflavin 400mg /CoQ10 300mg  daily Follow up 6 months.   Subjective:  [name] is a 40 year old right-handed female who presents for headaches.  History supplemented by OBGN note.  Onset:  First migraine around age 66.  Just aura without headache.  A couple years later, started more regularly with headache Location:  left sided/temple, 2021 - some left sided neck pain and left-occipital Quality:  pressure Intensity:  Severe.  She denies new headache, thunderclap headache Aura:  fuzzy mass in center of vision lasting 45 minutes.  Not with every migraine. Prodrome:  no Associated symptoms:  Nausea, photophobia, osmophobia. Sometimes neck pain.  Sensitive to touch on back of head. denies associated unilateral numbness or weakness. Duration:  2 hours with Relpax.  May last up to 5-6 days. Frequency:  10 to 12 days a month Frequency of abortive medication: 10-12 days Relpax Triggers:  Change in weather, hormonal Relieving factors:  sometimes an ice pack, resting, sometimes apply pressure to temple Activity:  aggravates  Current NSAIDS/analgesics:  Excedrin Migraine Current triptans:  Relpax 40mg  Current ergotamine:  none Current  anti-emetic:  none Current muscle relaxants:  Flexeril 5mg  QD PRN Current Antihypertensive medications:  none Current Antidepressant medications:  none Current Anticonvulsant medications:  none Current anti-CGRP:  none Current Vitamins/Herbal/Supplements:  melatonin, fish oil, D3 Current Antihistamines/Decongestants:  none Other therapy:  Daith piercing  Hormone/birth control:  none   Past NSAIDS/analgesics:  naproxen, tramadol Past abortive triptans:  sumatriptan tab (couldn't function on it) Past abortive ergotamine:  none Past muscle relaxants:  Flexeril at night for neck pain (makes her not feel well the following day. Past anti-emetic:  none Past antihypertensive medications:  none Past antidepressant medications:  trazodone Past anticonvulsant medications:  none Past anti-CGRP:  none Past vitamins/Herbal/Supplements:  none Past antihistamines/decongestants:  Flonase   Caffeine:  1 cup coffee in AM, one soda during day Diet:  Does not drink enough water.  Does not skip meals. Exercise:  Routine Depression:  no; Anxiety:  mild Other pain:  rotator cuff, mild arthritis in knees Sleep hygiene:  improved.  Stopped Ambien.  Now on melatonin Family history of headache:  no Patient works as a Passenger transport manager and would like a medication with minimal side effects.  Triptans make her feel fatigue which can affect her work.        PAST MEDICAL HISTORY: Past Medical History:  Diagnosis Date   Abnormal Pap smear of cervix    2008 or 2009   Anxiety    Dental bridge present    lower front   Dry eyes    Endometriosis    Headache    Migraines   History of indigestion    treat with OTC   Hypoglycemia    Irritable bowel syndrome (IBS)    Seasonal allergies  Trigger thumb of right hand 11/2013    PAST SURGICAL HISTORY: Past Surgical History:  Procedure Laterality Date   CHOLECYSTECTOMY  06/2015   CHROMOPERTUBATION N/A 05/30/2016   Procedure: CHROMOPERTUBATION;  Surgeon:  Megan Salon, MD;  Location: [facility];  Service: Gynecology;  Laterality: N/A;   COLPOSCOPY  2008   CYSTOSCOPY N/A 08/21/2017   Procedure: CYSTOSCOPY;  Surgeon: Megan Salon, MD;  Location: [facility];  Service: Gynecology;  Laterality: N/A;   LABIOPLASTY N/A 08/21/2017   Procedure: LABIAPLASTY;  Surgeon: Megan Salon, MD;  Location: [facility];  Service: Gynecology;  Laterality: N/A;   LAPAROSCOPY N/A 05/30/2016   Procedure: LAPAROSCOPY OPERATIVE WITH LYSIS OF ADHESIONS, EXCISION OF POSSIBLE ENDOMETRIOSIS;  Surgeon: Megan Salon, MD;  Location: [facility];  Service: Gynecology;  Laterality: N/A;   REFRACTIVE SURGERY Bilateral 02/2015   TONSILLECTOMY  1988   TOTAL LAPAROSCOPIC HYSTERECTOMY WITH SALPINGECTOMY Bilateral 08/21/2017   Procedure: TOTAL LAPAROSCOPIC HYSTERECTOMY WITH SALPINGECTOMY;  Surgeon: Megan Salon, MD;  Location: [facility];  Service: Gynecology;  Laterality: Bilateral;   TRIGGER FINGER RELEASE Right 12/23/2013   Procedure: RIGHT THUMB TRIGGER RELEASE ;  Surgeon: Tennis Must, MD;  Location: [facility];  Service: Orthopedics;  Laterality: Right;    MEDICATIONS: Current Outpatient Medications on File Prior to Visit  Medication Sig Dispense Refill   Carboxymethylcellulose Sod PF 0.5 % SOLN Place 1 drop into both eyes 2 (two) times daily.     Cholecalciferol (VITAMIN D3) 2000 units TABS Take 2,000 Units by mouth at bedtime.     cyclobenzaprine (FLEXERIL) 5 MG tablet Take 1 tablet (5 mg total) by mouth daily as needed for muscle spasms. 30 tablet 1   eletriptan (RELPAX) 40 MG tablet Take 1 tablet (40 mg total) by mouth one time as needed for headaches. 30 tablet 1   MELATONIN ER PO Take by mouth.     mirabegron ER (MYRBETRIQ) 25 MG TB24 tablet Take 1 tablet (25 mg total) by mouth daily. One po qd 30 tablet 12   montelukast (SINGULAIR) 10 MG tablet Take 1 tablet (10 mg total) by mouth at bedtime. 90 tablet 3   Multiple Vitamin (MULTIVITAMIN WITH MINERALS) TABS tablet Take 1  tablet by mouth at bedtime.     NONFORMULARY OR COMPOUNDED ITEM Testosterone 1mg /0.58ml topical cream.  Apply 0.10ml three times weekly to thighs. Disp: 90 day supply. 1 each 1   Omega-3 Fatty Acids (FISH OIL) 1000 MG CAPS Take 1 capsule (1,000 mg total) by mouth daily. 30 capsule 0   Simethicone 125 MG CAPS Take 125 mg by mouth at bedtime.     sulfamethoxazole-trimethoprim (BACTRIM DS) 800-160 MG tablet Take 1 tablet by mouth 2 (two) times daily. 6 tablet 0   valACYclovir (VALTREX) 1000 MG tablet Take 1 tablet (1,000 mg total) by mouth daily. 90 tablet 4   zolpidem (AMBIEN) 5 MG tablet TAKE 1 TABLET BY MOUTH AT BEDTIME AS NEEDED FOR SLEEP. (Patient not taking: Reported on 12/01/2020) 30 tablet 0   No current facility-administered medications on file prior to visit.    ALLERGIES: Allergies  Allergen Reactions   Other     Dermabond.  Topical blisters.    FAMILY HISTORY: Family History  Problem Relation Age of Onset   Breast cancer Maternal Grandmother 53       died early 66's   Hyperlipidemia Mother    Heart disease Father        valve replacement, aortic aneurysm  Cancer Paternal Grandfather        Jaw cancer, smoker    Objective:  Blood pressure 127/79, pulse 79, height 5\' 1"  (1.549 m), weight 117 lb 9.6 oz (53.3 kg), last menstrual period 07/31/2017, SpO2 100 %. General: No acute distress.  Patient appears well-groomed.   Head:  Normocephalic/atraumatic Eyes:  fundi examined but not visualized Neck: supple, no paraspinal tenderness, full range of motion Back: No paraspinal tenderness Heart: regular rate and rhythm Lungs: Clear to auscultation bilaterally. Vascular: No carotid bruits. Neurological Exam: Mental status: alert and oriented to person, place, and time, recent and remote memory intact, fund of knowledge intact, attention and concentration intact, speech fluent and not dysarthric, language intact. Cranial nerves: CN I: not tested CN II: pupils equal, round and  reactive to light, visual fields intact CN III, IV, VI:  full range of motion, no nystagmus, no ptosis CN V: facial sensation intact. CN VII: upper and lower face symmetric CN VIII: hearing intact CN IX, X: gag intact, uvula midline CN XI: sternocleidomastoid and trapezius muscles intact CN XII: tongue midline Bulk & Tone: normal, no fasciculations. Motor:  muscle strength 5/5 throughout Sensation:  Pinprick, temperature and vibratory sensation intact. Deep Tendon Reflexes:  2+ throughout,  toes downgoing.   Finger to nose testing:  Without dysmetria.   Heel to shin:  Without dysmetria.   Gait:  Normal station and stride.  Romberg negative.    Thank you for allowing me to take part in the care of this patient.  Metta Clines, DO  CC:  Debbrah Alar, NP

## 2021-02-03 ENCOUNTER — Ambulatory Visit: Payer: PRIVATE HEALTH INSURANCE | Admitting: Neurology

## 2021-02-03 ENCOUNTER — Other Ambulatory Visit: Payer: Self-pay

## 2021-02-03 ENCOUNTER — Encounter: Payer: Self-pay | Admitting: Neurology

## 2021-02-03 VITALS — BP 127/79 | HR 79 | Ht 61.0 in | Wt 117.6 lb

## 2021-02-03 DIAGNOSIS — G43009 Migraine without aura, not intractable, without status migrainosus: Secondary | ICD-10-CM | POA: Diagnosis not present

## 2021-02-03 DIAGNOSIS — G43109 Migraine with aura, not intractable, without status migrainosus: Secondary | ICD-10-CM

## 2021-02-03 MED ORDER — ONDANSETRON 4 MG PO TBDP
4.0000 mg | ORAL_TABLET | Freq: Three times a day (TID) | ORAL | 5 refills | Status: DC | PRN
Start: 2021-02-03 — End: 2023-10-09

## 2021-02-03 MED ORDER — METHOCARBAMOL 500 MG PO TABS: 500.0000 mg | ORAL_TABLET | Freq: Four times a day (QID) | ORAL | 5 refills | Status: DC | PRN

## 2021-02-03 MED ORDER — QULIPTA 60 MG PO TABS: 60.0000 mg | ORAL_TABLET | Freq: Every day | ORAL | 5 refills | Status: DC

## 2021-02-03 NOTE — Patient Instructions (Signed)
  Start Qulipta 60mg  daily.  Contact us in 3 months with update and we can change treatment if needed Take Ubrelvy 100mg  at earliest onset of headache.  May repeat dose once in 2 hours if needed.  Maximum 2 tablets in 24 hours. If effective, contact me for a prescription. If he develop neck pain, take methocarbamol and hopefully will prevent onset of migraine as well. For nausea, take ondansetron Limit use of pain relievers to no more than 2 days out of the week.  These medications include acetaminophen, NSAIDs (ibuprofen/Advil/Motrin, naproxen/Aleve, triptans (Imitrex/sumatriptan), Excedrin, and narcotics.  This will help reduce risk of rebound headaches. Be aware of common food triggers:  - Caffeine:  coffee, black tea, cola, Mt. Dew  - Chocolate  - Dairy:  aged cheeses (brie, blue, cheddar, gouda, [place], provolone, [place], Swiss, etc), chocolate milk, buttermilk, sour cream, limit eggs and yogurt  - Nuts, peanut butter  - Alcohol  - Cereals/grains:  FRESH breads (fresh bagels, sourdough, doughnuts), yeast productions  - Processed/canned/aged/cured meats (pre-packaged deli meats, hotdogs)  - MSG/glutamate:  soy sauce, flavor enhancer, pickled/preserved/marinated foods  - Sweeteners:  aspartame (Equal, Nutrasweet).  Sugar and Splenda are okay  - Vegetables:  legumes (lima beans, lentils, snow peas, fava beans, pinto peans, peas, garbanzo beans), sauerkraut, onions, olives, pickles  - Fruit:  avocados, bananas, citrus fruit (orange, lemon, grapefruit), mango  - Other:  Frozen meals, macaroni and cheese Routine exercise Stay adequately hydrated (aim for 64 oz water daily) Keep headache diary Maintain proper stress management Maintain proper sleep hygiene Do not skip meals Consider supplements:  magnesium citrate 400mg  daily, riboflavin 400mg  daily, coenzyme Q10 100mg  three times daily.

## 2021-03-04 ENCOUNTER — Encounter (HOSPITAL_BASED_OUTPATIENT_CLINIC_OR_DEPARTMENT_OTHER): Payer: Self-pay

## 2021-03-11 ENCOUNTER — Other Ambulatory Visit (HOSPITAL_BASED_OUTPATIENT_CLINIC_OR_DEPARTMENT_OTHER): Payer: Self-pay | Admitting: Obstetrics & Gynecology

## 2021-03-11 DIAGNOSIS — R3915 Urgency of urination: Secondary | ICD-10-CM

## 2021-03-11 DIAGNOSIS — Z9189 Other specified personal risk factors, not elsewhere classified: Secondary | ICD-10-CM

## 2021-03-11 DIAGNOSIS — Z803 Family history of malignant neoplasm of breast: Secondary | ICD-10-CM

## 2021-03-12 NOTE — Addendum Note (Signed)
Addended by: Megan Salon on: 03/12/2021 12:38 PM   Modules accepted: Orders

## 2021-03-16 ENCOUNTER — Ambulatory Visit: Payer: PRIVATE HEALTH INSURANCE | Admitting: Physical Therapy

## 2021-03-17 ENCOUNTER — Encounter: Payer: Self-pay | Admitting: Physical Therapy

## 2021-03-17 ENCOUNTER — Other Ambulatory Visit: Payer: Self-pay

## 2021-03-17 ENCOUNTER — Other Ambulatory Visit (HOSPITAL_BASED_OUTPATIENT_CLINIC_OR_DEPARTMENT_OTHER): Payer: Self-pay | Admitting: Obstetrics & Gynecology

## 2021-03-17 ENCOUNTER — Ambulatory Visit: Payer: No Typology Code available for payment source | Attending: Family | Admitting: Physical Therapy

## 2021-03-17 DIAGNOSIS — Z803 Family history of malignant neoplasm of breast: Secondary | ICD-10-CM

## 2021-03-17 DIAGNOSIS — R252 Cramp and spasm: Secondary | ICD-10-CM

## 2021-03-17 DIAGNOSIS — M6281 Muscle weakness (generalized): Secondary | ICD-10-CM | POA: Diagnosis present

## 2021-03-17 DIAGNOSIS — Z9189 Other specified personal risk factors, not elsewhere classified: Secondary | ICD-10-CM

## 2021-03-17 DIAGNOSIS — R279 Unspecified lack of coordination: Secondary | ICD-10-CM

## 2021-03-17 NOTE — Therapy (Signed)
[facility] [address] [place], [place], [address] Phone: [phone]   Fax:  [phone]  Physical Therapy Evaluation  Patient Details  Name: [name] MRN: [id number] Date of Birth: 12/01/1980 Referring Provider (PT): Megan Salon, MD   Encounter Date: 03/17/2021   PT End of Session - 03/17/21 1615     Visit Number 1    Date for PT Re-Evaluation 06/09/21    Authorization Type medcost    PT Start Time 1615    PT Stop Time 1650    PT Time Calculation (min) 35 min    Activity Tolerance Patient tolerated treatment well    Behavior During Therapy [facility] for tasks assessed/performed             Past Medical History:  Diagnosis Date   Abnormal Pap smear of cervix    2008 or 2009   Anxiety    Dental bridge present    lower front   Dry eyes    Endometriosis    Headache    Migraines   History of indigestion    treat with OTC   Hypoglycemia    Irritable bowel syndrome (IBS)    Seasonal allergies    Trigger thumb of right hand 11/2013    Past Surgical History:  Procedure Laterality Date   CHOLECYSTECTOMY  06/2015   CHROMOPERTUBATION N/A 05/30/2016   Procedure: CHROMOPERTUBATION;  Surgeon: Megan Salon, MD;  Location: [facility];  Service: Gynecology;  Laterality: N/A;   COLPOSCOPY  2008   CYSTOSCOPY N/A 08/21/2017   Procedure: CYSTOSCOPY;  Surgeon: Megan Salon, MD;  Location: [facility];  Service: Gynecology;  Laterality: N/A;   LABIOPLASTY N/A 08/21/2017   Procedure: LABIAPLASTY;  Surgeon: Megan Salon, MD;  Location: [facility];  Service: Gynecology;  Laterality: N/A;   LAPAROSCOPY N/A 05/30/2016   Procedure: LAPAROSCOPY OPERATIVE WITH LYSIS OF ADHESIONS, EXCISION OF POSSIBLE ENDOMETRIOSIS;  Surgeon: Megan Salon, MD;  Location: [facility];  Service: Gynecology;  Laterality: N/A;   REFRACTIVE SURGERY Bilateral 02/2015   TONSILLECTOMY  1988   TOTAL LAPAROSCOPIC HYSTERECTOMY WITH SALPINGECTOMY Bilateral  08/21/2017   Procedure: TOTAL LAPAROSCOPIC HYSTERECTOMY WITH SALPINGECTOMY;  Surgeon: Megan Salon, MD;  Location: [facility];  Service: Gynecology;  Laterality: Bilateral;   TRIGGER FINGER RELEASE Right 12/23/2013   Procedure: RIGHT THUMB TRIGGER RELEASE ;  Surgeon: Tennis Must, MD;  Location: [facility];  Service: Orthopedics;  Laterality: Right;    There were no vitals filed for this visit.    Subjective Assessment - 03/17/21 1617     Subjective Pt is having discomfort and hard to empty the bladder, urgency, frequency.  Voiding every hour or 2x per hour. Had incomplete bladder emptying that she was able to empty after test and is now making sure to double void.  Still a lot of urge and frequency    Patient Stated Goals less discomfort and less frequency    Currently in Pain? Yes    Pain Score 4     Pain Location Bladder    Pain Orientation Mid    Pain Descriptors / Indicators Discomfort    Pain Type Chronic pain    Pain Onset More than a month ago    Pain Frequency Intermittent    Aggravating Factors  full bladder    Pain Relieving Factors going to the bathroom, mybetriq    Effect of Pain on Daily Activities hiking somewhat limited, work unconfortble  [facility] PT Assessment - 03/18/21 0001       Assessment   Medical Diagnosis R39.15 (ICD-10-CM) - Urinary urgency    Referring Provider (PT) Megan Salon, MD    Onset Date/Surgical Date --   07/2017   Prior Therapy No      Balance Screen   Has the patient fallen in the past 6 months No      [place] residence    Living Arrangements Spouse/significant other      Prior Function   Level of Independence Independent    Vocation Full time employment    Vocation Requirements standing work in [place]   Overall Cognitive Status Within Functional Limits for tasks assessed      Posture/Postural Control   Posture/Postural Control No significant  limitations      ROM / Strength   AROM / PROM / Strength PROM;Strength      PROM   Overall PROM Comments Rt hip 80% rotation      Strength   Overall Strength Comments Rt abduction 4/5      Flexibility   Soft Tissue Assessment /Muscle Length yes    Hamstrings Rt 80%      Palpation   Palpation comment tight lumbar, gluteals, adductors      Ambulation/Gait   Gait Pattern Within Functional Limits                        Objective measurements completed on examination: See above findings.     Pelvic Floor Special Questions - 03/18/21 0001     Prior Pregnancies No    Currently Sexually Active Yes    Is this Painful No    Urinary Leakage Yes    How often very occasional    Urinary urgency Yes    Urinary frequency 2x/hour    Fecal incontinence --   constipation and   Falling out feeling (prolapse) No    Skin Integrity Intact    Prolapse None    Pelvic Floor Internal Exam pt identity confirmed and informed consent given    Exam Type Vaginal    Palpation Rt levators and bulbocav tight    Strength fair squeeze, definite lift    Strength # of reps 3   13 quick flicks   Strength # of seconds 5    Tone high              [facility] Adult PT Treatment/Exercise - 03/18/21 0001       Self-Care   Self-Care Other Self-Care Comments    Other Self-Care Comments  self stretch internal to vaginal canal                         PT Long Term Goals - 03/17/21 1654       PT LONG TERM GOAL #1   Title Pt will be able to reduce bladder urgency down so she can wait to void every 2 hours at most    Time 12    Period Weeks    Status New    Target Date 06/09/21      PT LONG TERM GOAL #2   Title Pt will be ind with advanced HEP    Time 12    Period Weeks    Status New    Target Date 06/09/21      PT LONG TERM GOAL #3  Title Pt will report at least 75% less bladder discomfort with full bladder    Time 12    Period Weeks    Status New    Target  Date 06/09/21                    Plan - 03/17/21 1657     Clinical Impression Statement Pt presents to clinic due to symptoms occuring s/p hysterectomy.  Pt has pelvic floor tension Rt>Lt and lateral Rt hip weakness in squatting.  Pt has h/s tension Rt>Lt.  Pt has tight gluteals and lumbar paraspinals.  Pt has a hard time relaxing the pelvic floor after she contracts. pt has pelvic floor 3/5 MMT with endurance of 5 seconds.  Pt was able to perform 13 quick flicks in 10 seconds.  pt will benefit from skilled PT to address impairments and restore more bladder control.    Personal Factors and Comorbidities Comorbidity 3+    Comorbidities cholecystectomy, IBS, hysterectomy 2019    Examination-Activity Limitations Toileting    Examination-Participation Restrictions Occupation;Community Activity    Stability/Clinical Decision Making Stable/Uncomplicated    Clinical Decision Making Low    Rehab Potential Excellent    PT Frequency 1x / week    PT Duration 12 weeks    PT Treatment/Interventions ADLs/Self Care Home Management;Biofeedback;Cryotherapy;Electrical Stimulation;Iontophoresis '4mg'$ /ml Dexamethasone;Moist Heat;Neuromuscular re-education;Therapeutic exercise;Therapeutic activities;Patient/family education;Manual techniques;Dry needling;Passive range of motion;Taping    PT Next Visit Plan DN gluteals and lumbar; abdominal fascial release and bladder diary and urgency technique review    PT Home Exercise Plan urge and DN info    Consulted and Agree with Plan of Care Patient             Patient will benefit from skilled therapeutic intervention in order to improve the following deficits and impairments:  Postural dysfunction, Decreased strength, Pain, Increased muscle spasms, Increased fascial restricitons, Decreased range of motion, Decreased coordination  Visit Diagnosis: Unspecified lack of coordination  Muscle weakness (generalized)  Cramp and spasm     Problem  List Patient Active Problem List   Diagnosis Date Noted   Migraine with aura and without status migrainosus, not intractable 12/01/2020   Urinary urgency 12/01/2020   Increased risk of breast cancer 12/01/2020   HSV-2 (herpes simplex virus 2) infection 12/01/2020   H/O: hysterectomy 11/27/2019   Hyperlipidemia    Endometriosis 05/01/2017   Spasm of bowel 01/29/2016   IBS (irritable bowel syndrome) 01/29/2016    Jule Ser, PT 03/18/2021, 7:59 AM  [facility] [address] [place], [place], [address] Phone: [phone]   Fax:  [phone]  Name: [name] MRN: [id number] Date of Birth: 08-04-1981

## 2021-03-17 NOTE — Patient Instructions (Signed)

## 2021-03-18 ENCOUNTER — Ambulatory Visit: Payer: No Typology Code available for payment source | Admitting: Physical Therapy

## 2021-03-22 ENCOUNTER — Other Ambulatory Visit: Payer: Self-pay

## 2021-03-22 ENCOUNTER — Ambulatory Visit: Payer: No Typology Code available for payment source | Attending: Family | Admitting: Physical Therapy

## 2021-03-22 DIAGNOSIS — R279 Unspecified lack of coordination: Secondary | ICD-10-CM | POA: Diagnosis present

## 2021-03-22 DIAGNOSIS — M6281 Muscle weakness (generalized): Secondary | ICD-10-CM | POA: Insufficient documentation

## 2021-03-22 DIAGNOSIS — R252 Cramp and spasm: Secondary | ICD-10-CM | POA: Insufficient documentation

## 2021-03-22 NOTE — Therapy (Signed)
[facility] [address] [place], [place], [address] Phone: [phone]   Fax:  [phone]  Physical Therapy Treatment  Patient Details  Name: AIRAM SILBERMAN MRN: [id number] Date of Birth: [birth year] Referring Provider (PT): Megan Salon, MD   Encounter Date: 03/22/2021   PT End of Session - 03/22/21 0849     Visit Number 2    Date for PT Re-Evaluation 06/09/21    Authorization Type medcost    PT Start Time 0805    PT Stop Time 0845    PT Time Calculation (min) 40 min    Activity Tolerance Patient tolerated treatment well    Behavior During Therapy [facility] for tasks assessed/performed             Past Medical History:  Diagnosis Date   Abnormal Pap smear of cervix    2008 or 2009   Anxiety    Dental bridge present    lower front   Dry eyes    Endometriosis    Headache    Migraines   History of indigestion    treat with OTC   Hypoglycemia    Irritable bowel syndrome (IBS)    Seasonal allergies    Trigger thumb of right hand 11/2013    Past Surgical History:  Procedure Laterality Date   CHOLECYSTECTOMY  06/2015   CHROMOPERTUBATION N/A 05/30/2016   Procedure: CHROMOPERTUBATION;  Surgeon: Megan Salon, MD;  Location: [facility];  Service: Gynecology;  Laterality: N/A;   COLPOSCOPY  2008   CYSTOSCOPY N/A 08/21/2017   Procedure: CYSTOSCOPY;  Surgeon: Megan Salon, MD;  Location: [facility];  Service: Gynecology;  Laterality: N/A;   LABIOPLASTY N/A 08/21/2017   Procedure: LABIAPLASTY;  Surgeon: Megan Salon, MD;  Location: [facility];  Service: Gynecology;  Laterality: N/A;   LAPAROSCOPY N/A 05/30/2016   Procedure: LAPAROSCOPY OPERATIVE WITH LYSIS OF ADHESIONS, EXCISION OF POSSIBLE ENDOMETRIOSIS;  Surgeon: Megan Salon, MD;  Location: [facility];  Service: Gynecology;  Laterality: N/A;   REFRACTIVE SURGERY Bilateral 02/2015   TONSILLECTOMY  1988   TOTAL LAPAROSCOPIC HYSTERECTOMY WITH SALPINGECTOMY Bilateral  08/21/2017   Procedure: TOTAL LAPAROSCOPIC HYSTERECTOMY WITH SALPINGECTOMY;  Surgeon: Megan Salon, MD;  Location: [facility];  Service: Gynecology;  Laterality: Bilateral;   TRIGGER FINGER RELEASE Right 12/23/2013   Procedure: RIGHT THUMB TRIGGER RELEASE ;  Surgeon: Tennis Must, MD;  Location: [facility];  Service: Orthopedics;  Laterality: Right;    There were no vitals filed for this visit.   Subjective Assessment - 03/22/21 0848     Subjective I tried the self massage and felt soreness in my lower abdomen for a couple days.  I did it a second time and it wasn't as bad.    Patient Stated Goals less discomfort and less frequency    Currently in Pain? No/denies                            Pelvic Floor Special Questions - 03/22/21 0001     Pelvic Floor Internal Exam pt identity confirmed and informed consent given               [facility] Adult PT Treatment/Exercise - 03/22/21 0001       Self-Care   Self-Care Other Self-Care Comments    Other Self-Care Comments  review of urge and verbal education on initial HEP  Manual Therapy   Manual Therapy Myofascial release;Internal Pelvic Floor    Manual therapy comments pt identity confirmed and informed consent given    Myofascial Release lumbar, bladder and vaginal canal via low abdomen    Internal Pelvic Floor vaginal canal and urethra fascial release                         PT Long Term Goals - 03/17/21 1654       PT LONG TERM GOAL #1   Title Pt will be able to reduce bladder urgency down so she can wait to void every 2 hours at most    Time 12    Period Weeks    Status New    Target Date 06/09/21      PT LONG TERM GOAL #2   Title Pt will be ind with advanced HEP    Time 12    Period Weeks    Status New    Target Date 06/09/21      PT LONG TERM GOAL #3   Title Pt will report at least 75% less bladder discomfort with full bladder    Time 12    Period Weeks    Status  New    Target Date 06/09/21                   Plan - 03/22/21 0849     Clinical Impression Statement Pt responded to dry needling well with twitch and release in Lt lumbar region being the most significant repsponse and this helped fascial releases to the region.  Fascial releases also perform lower abdomen and internally to vaginal canal and urethra.  In today's session, urge techniques were reviewed and initial HEP given for stretch and basic kegels.    PT Treatment/Interventions ADLs/Self Care Home Management;Biofeedback;Cryotherapy;Electrical Stimulation;Iontophoresis '4mg'$ /ml Dexamethasone;Moist Heat;Neuromuscular re-education;Therapeutic exercise;Therapeutic activities;Patient/family education;Manual techniques;Dry needling;Passive range of motion;Taping    PT Next Visit Plan f/u on frequ and voiding; progress kegel strengthening and f/u on internal soft tissue release    Consulted and Agree with Plan of Care Patient             Patient will benefit from skilled therapeutic intervention in order to improve the following deficits and impairments:  Postural dysfunction, Decreased strength, Pain, Increased muscle spasms, Increased fascial restricitons, Decreased range of motion, Decreased coordination  Visit Diagnosis: Unspecified lack of coordination  Muscle weakness (generalized)  Cramp and spasm     Problem List Patient Active Problem List   Diagnosis Date Noted   Migraine with aura and without status migrainosus, not intractable 12/01/2020   Urinary urgency 12/01/2020   Increased risk of breast cancer 12/01/2020   HSV-2 (herpes simplex virus 2) infection 12/01/2020   H/O: hysterectomy 11/27/2019   Hyperlipidemia    Endometriosis 05/01/2017   Spasm of bowel 01/29/2016   IBS (irritable bowel syndrome) 01/29/2016    Camillo Flaming Jakeim Sedore, PT 03/22/2021, 9:06 AM  [facility] [address], [place], [place], [address] Phone: [phone]   Fax:  [phone]  Name: CYRILLA VANWYE MRN: [id number] Date of Birth: 1981/06/06

## 2021-04-07 ENCOUNTER — Ambulatory Visit: Payer: No Typology Code available for payment source | Admitting: Physical Therapy

## 2021-04-07 ENCOUNTER — Encounter: Payer: No Typology Code available for payment source | Admitting: Physical Therapy

## 2021-04-07 ENCOUNTER — Encounter: Payer: Self-pay | Admitting: Physical Therapy

## 2021-04-07 ENCOUNTER — Other Ambulatory Visit: Payer: Self-pay

## 2021-04-07 DIAGNOSIS — R279 Unspecified lack of coordination: Secondary | ICD-10-CM

## 2021-04-07 DIAGNOSIS — M6281 Muscle weakness (generalized): Secondary | ICD-10-CM

## 2021-04-07 DIAGNOSIS — R252 Cramp and spasm: Secondary | ICD-10-CM

## 2021-04-07 NOTE — Patient Instructions (Signed)
Access Code: [email or web address] URL: [email or web address] Date: 04/07/2021 Prepared by: Jari Favre  Exercises Supine Pelvic Floor Contraction - 3 x daily - 7 x weekly - 10 reps - 1 sets - 3 sec hold Standing Bicep Curl with Pelvic Floor Contraction - 1 x daily - 7 x weekly - 1 sets - 10 reps Mini Squat with Pelvic Floor Contraction - 1 x daily - 7 x weekly - 1 sets - 10 reps Thoracic Extension Mobilization with Noodle - 1 x daily - 7 x weekly - 3 sets - 10 reps Seated Thoracic Lumbar Extension - 1 x daily - 7 x weekly - 10 reps - 1 sets - 5 sec hold Quadruped Rock Back into [employer] Up - 1 x daily - 7 x weekly - 3 sets - 10 reps

## 2021-04-07 NOTE — Therapy (Signed)
[facility] [address], [place], [place], [address] Phone: [phone]   Fax:  [phone]  Physical Therapy Treatment  Patient Details  Name: [name] MRN: [id number] Date of Birth: 12-Jan-1981 Referring Provider (PT): Megan Salon, MD   Encounter Date: 04/07/2021   PT End of Session - 04/07/21 1722     Visit Number 3    Date for PT Re-Evaluation 06/09/21    Authorization Type medcost    PT Start Time 1530    PT Stop Time 1615    PT Time Calculation (min) 45 min    Activity Tolerance Patient tolerated treatment well    Behavior During Therapy [facility] for tasks assessed/performed             Past Medical History:  Diagnosis Date   Abnormal Pap smear of cervix    2008 or 2009   Anxiety    Dental bridge present    lower front   Dry eyes    Endometriosis    Headache    Migraines   History of indigestion    treat with OTC   Hypoglycemia    Irritable bowel syndrome (IBS)    Seasonal allergies    Trigger thumb of right hand 11/2013    Past Surgical History:  Procedure Laterality Date   CHOLECYSTECTOMY  06/2015   CHROMOPERTUBATION N/A 05/30/2016   Procedure: CHROMOPERTUBATION;  Surgeon: Megan Salon, MD;  Location: [facility];  Service: Gynecology;  Laterality: N/A;   COLPOSCOPY  2008   CYSTOSCOPY N/A 08/21/2017   Procedure: CYSTOSCOPY;  Surgeon: Megan Salon, MD;  Location: [facility];  Service: Gynecology;  Laterality: N/A;   LABIOPLASTY N/A 08/21/2017   Procedure: LABIAPLASTY;  Surgeon: Megan Salon, MD;  Location: [facility];  Service: Gynecology;  Laterality: N/A;   LAPAROSCOPY N/A 05/30/2016   Procedure: LAPAROSCOPY OPERATIVE WITH LYSIS OF ADHESIONS, EXCISION OF POSSIBLE ENDOMETRIOSIS;  Surgeon: Megan Salon, MD;  Location: [facility];  Service: Gynecology;  Laterality: N/A;   REFRACTIVE SURGERY Bilateral 02/2015   TONSILLECTOMY  1988   TOTAL LAPAROSCOPIC HYSTERECTOMY WITH SALPINGECTOMY Bilateral  08/21/2017   Procedure: TOTAL LAPAROSCOPIC HYSTERECTOMY WITH SALPINGECTOMY;  Surgeon: Megan Salon, MD;  Location: [facility];  Service: Gynecology;  Laterality: Bilateral;   TRIGGER FINGER RELEASE Right 12/23/2013   Procedure: RIGHT THUMB TRIGGER RELEASE ;  Surgeon: Tennis Must, MD;  Location: [facility];  Service: Orthopedics;  Laterality: Right;    There were no vitals filed for this visit.   Subjective Assessment - 04/07/21 1723     Subjective I feel about the same as far as the urgency.  I had some pain in the abdomen occasionally.    Patient Stated Goals less discomfort and less frequency    Currently in Pain? No/denies                               [facility] Adult PT Treatment/Exercise - 04/07/21 0001       Neuro Re-ed    Neuro Re-ed Details  biofeedback      Lumbar Exercises: Standing   Functional Squats Limitations mini squat with kegel    Other Standing Lumbar Exercises standing kegel - with exhale and then lifting 3 lb weight with exhale      Manual Therapy   Myofascial Release abdominal fascial release central to bladder and bilateral  PT Education - 04/07/21 1602     Education Details Access Code: [email or web address]    Person(s) Educated Patient    Methods Explanation;Demonstration;Tactile cues;Verbal cues;Handout    Comprehension Verbalized understanding;Returned demonstration                 PT Long Term Goals - 04/07/21 1721       PT LONG TERM GOAL #1   Title Pt will be able to reduce bladder urgency down so she can wait to void every 2 hours at most    Status On-going      PT LONG TERM GOAL #2   Title Pt will be ind with advanced HEP    Status On-going      PT LONG TERM GOAL #3   Title Pt will report at least 75% less bladder discomfort with full bladder    Status On-going                   Plan - 04/07/21 1625     Clinical Impression Statement Pt responded well to myofascial  release to lower abdomen with a lot of releases. Today's treatment included biofeedback and progression of exercises added to HEP.  Pt was able to contract and hold with rest in between sets.  pt will benefit from skilled PT to continue to address strength and soft tissue release for improved bladder control and reduced pain.    PT Treatment/Interventions ADLs/Self Care Home Management;Biofeedback;Cryotherapy;Electrical Stimulation;Iontophoresis '4mg'$ /ml Dexamethasone;Moist Heat;Neuromuscular re-education;Therapeutic exercise;Therapeutic activities;Patient/family education;Manual techniques;Dry needling;Passive range of motion;Taping    PT Next Visit Plan f/u on abdominal fascial release and try very gentle internal release if able    PT Home Exercise Plan urge and DN info    Consulted and Agree with Plan of Care Patient             Patient will benefit from skilled therapeutic intervention in order to improve the following deficits and impairments:  Postural dysfunction, Decreased strength, Pain, Increased muscle spasms, Increased fascial restricitons, Decreased range of motion, Decreased coordination  Visit Diagnosis: Unspecified lack of coordination  Muscle weakness (generalized)  Cramp and spasm     Problem List Patient Active Problem List   Diagnosis Date Noted   Migraine with aura and without status migrainosus, not intractable 12/01/2020   Urinary urgency 12/01/2020   Increased risk of breast cancer 12/01/2020   HSV-2 (herpes simplex virus 2) infection 12/01/2020   H/O: hysterectomy 11/27/2019   Hyperlipidemia    Endometriosis 05/01/2017   Spasm of bowel 01/29/2016   IBS (irritable bowel syndrome) 01/29/2016    [name], PT 04/07/2021, 5:26 PM  [facility] [address] [place], [place], [address] Phone: [phone]   Fax:  [phone]  Name: [name] MRN: [id number] Date of Birth:  03/10/1981

## 2021-04-09 ENCOUNTER — Other Ambulatory Visit (HOSPITAL_BASED_OUTPATIENT_CLINIC_OR_DEPARTMENT_OTHER): Payer: Self-pay | Admitting: Obstetrics & Gynecology

## 2021-04-09 ENCOUNTER — Encounter (HOSPITAL_BASED_OUTPATIENT_CLINIC_OR_DEPARTMENT_OTHER): Payer: Self-pay | Admitting: *Deleted

## 2021-04-09 DIAGNOSIS — Z9189 Other specified personal risk factors, not elsewhere classified: Secondary | ICD-10-CM

## 2021-04-09 DIAGNOSIS — Z803 Family history of malignant neoplasm of breast: Secondary | ICD-10-CM

## 2021-04-12 ENCOUNTER — Other Ambulatory Visit: Payer: Self-pay

## 2021-04-12 ENCOUNTER — Encounter: Payer: Self-pay | Admitting: Physical Therapy

## 2021-04-12 ENCOUNTER — Ambulatory Visit: Payer: No Typology Code available for payment source | Admitting: Physical Therapy

## 2021-04-12 DIAGNOSIS — R279 Unspecified lack of coordination: Secondary | ICD-10-CM

## 2021-04-12 DIAGNOSIS — M6281 Muscle weakness (generalized): Secondary | ICD-10-CM

## 2021-04-12 DIAGNOSIS — R252 Cramp and spasm: Secondary | ICD-10-CM

## 2021-04-12 NOTE — Therapy (Signed)
[facility] [address] [place], [place], [address] Phone: [phone]   Fax:  [phone]  Physical Therapy Treatment  Patient Details  Name: [name] MRN: [id number] Date of Birth: 20-Feb-1981 Referring Provider (PT): Megan Salon, MD   Encounter Date: 04/12/2021   PT End of Session - 04/12/21 1728     Visit Number 4    Date for PT Re-Evaluation 06/09/21    Authorization Type medcost    PT Start Time 1610    PT Stop Time 1710    PT Time Calculation (min) 60 min    Activity Tolerance Patient tolerated treatment well    Behavior During Therapy [facility] for tasks assessed/performed             Past Medical History:  Diagnosis Date   Abnormal Pap smear of cervix    2008 or 2009   Anxiety    Dental bridge present    lower front   Dry eyes    Endometriosis    Headache    Migraines   History of indigestion    treat with OTC   Hypoglycemia    Irritable bowel syndrome (IBS)    Seasonal allergies    Trigger thumb of right hand 11/2013    Past Surgical History:  Procedure Laterality Date   CHOLECYSTECTOMY  06/2015   CHROMOPERTUBATION N/A 05/30/2016   Procedure: CHROMOPERTUBATION;  Surgeon: Megan Salon, MD;  Location: [facility];  Service: Gynecology;  Laterality: N/A;   COLPOSCOPY  2008   CYSTOSCOPY N/A 08/21/2017   Procedure: CYSTOSCOPY;  Surgeon: Megan Salon, MD;  Location: [facility];  Service: Gynecology;  Laterality: N/A;   LABIOPLASTY N/A 08/21/2017   Procedure: LABIAPLASTY;  Surgeon: Megan Salon, MD;  Location: [facility];  Service: Gynecology;  Laterality: N/A;   LAPAROSCOPY N/A 05/30/2016   Procedure: LAPAROSCOPY OPERATIVE WITH LYSIS OF ADHESIONS, EXCISION OF POSSIBLE ENDOMETRIOSIS;  Surgeon: Megan Salon, MD;  Location: [facility];  Service: Gynecology;  Laterality: N/A;   REFRACTIVE SURGERY Bilateral 02/2015   TONSILLECTOMY  1988   TOTAL LAPAROSCOPIC HYSTERECTOMY WITH SALPINGECTOMY Bilateral  08/21/2017   Procedure: TOTAL LAPAROSCOPIC HYSTERECTOMY WITH SALPINGECTOMY;  Surgeon: Megan Salon, MD;  Location: [facility];  Service: Gynecology;  Laterality: Bilateral;   TRIGGER FINGER RELEASE Right 12/23/2013   Procedure: RIGHT THUMB TRIGGER RELEASE ;  Surgeon: Tennis Must, MD;  Location: [facility];  Service: Orthopedics;  Laterality: Right;    There were no vitals filed for this visit.   Subjective Assessment - 04/12/21 1714     Subjective The frequency and urgency is better.  I still have the bladder pain when the bladder gets really full.  Now I am having this pain that feels like the Rt ovary which is what I had before the surgery.    Patient Stated Goals less discomfort and less frequency    Currently in Pain? No/denies                               [facility] Adult PT Treatment/Exercise - 04/12/21 0001       Self-Care   Other Self-Care Comments  foam rolling and adding 2 stretches - happy baby and thoracic rotation      Manual Therapy   Manual Therapy Soft tissue mobilization;Myofascial release    Soft tissue mobilization Rt quads and gluteals, lumbar and thoracic paraspinals; rocking  for lumbar and thoracic release    Myofascial Release abdominal fascial release central to bladder and bilateral, lumbar region              Trigger Point Dry Needling - 04/12/21 0001     Consent Given? Yes    Education Handout Provided Yes    Muscles Treated Back/Hip Thoracic multifidi;Lumbar multifidi    Lumbar multifidi Response Twitch response elicited;Palpable increased muscle length    Thoracic multifidi response Twitch response elicited;Palpable increased muscle length                  PT Education - 04/12/21 1712     Education Details added happy baby and thoracic rotation to stretches    Person(s) Educated Patient    Methods Explanation;Demonstration;Tactile cues;Verbal cues;Handout    Comprehension Verbalized  understanding;Returned demonstration                 PT Long Term Goals - 04/12/21 1644       PT LONG TERM GOAL #1   Title Pt will be able to reduce bladder urgency down so she can wait to void every 2 hours at most    Status Achieved      PT LONG TERM GOAL #2   Title Pt will be ind with advanced HEP    Status On-going      PT LONG TERM GOAL #3   Title Pt will report at least 75% less bladder discomfort with full bladder    Baseline same when I have a really full bladder and having the Rt lower quadrant pain again    Status On-going                   Plan - 04/12/21 1716     Clinical Impression Statement Today's session focused on myofascial release and STM to address tension throughout lumbar and abdomen.  Pt tolerated treatment well and reports feeling a little less of the discomfort in the lower abdomen afterwards.  Pt has tension in the Rt gluteals, lumbar and low thoracic spine and rotation mobs were performed.  Pt was educated on adding foam rolling to gluteals, happy baby and thoracic rotation stretches to her routine to maintain improved soft tissue length gained during today's treatment.    PT Treatment/Interventions ADLs/Self Care Home Management;Biofeedback;Cryotherapy;Electrical Stimulation;Iontophoresis '4mg'$ /ml Dexamethasone;Moist Heat;Neuromuscular re-education;Therapeutic exercise;Therapeutic activities;Patient/family education;Manual techniques;Dry needling;Passive range of motion;Taping    PT Next Visit Plan internal STM f/u to doing this at home and add to treatment next; lumbar release and rotation mobs    PT Home Exercise Plan Access Code: [email or web address]    Consulted and Agree with Plan of Care Patient             Patient will benefit from skilled therapeutic intervention in order to improve the following deficits and impairments:  Postural dysfunction, Decreased strength, Pain, Increased muscle spasms, Increased fascial restricitons, Decreased range  of motion, Decreased coordination  Visit Diagnosis: Unspecified lack of coordination  Muscle weakness (generalized)  Cramp and spasm     Problem List Patient Active Problem List   Diagnosis Date Noted   Migraine with aura and without status migrainosus, not intractable 12/01/2020   Urinary urgency 12/01/2020   Increased risk of breast cancer 12/01/2020   HSV-2 (herpes simplex virus 2) infection 12/01/2020   H/O: hysterectomy 11/27/2019   Hyperlipidemia    Endometriosis 05/01/2017   Spasm of bowel 01/29/2016   IBS (irritable bowel syndrome) 01/29/2016    Janey Genta  L Tiney Zipper, PT 04/12/2021, 5:33 PM  [facility] [address] [place], [place], [address] Phone: [phone]   Fax:  [phone]  Name: [name] MRN: [id number] Date of Birth: [birth year]

## 2021-04-16 ENCOUNTER — Other Ambulatory Visit: Payer: Self-pay

## 2021-04-16 ENCOUNTER — Encounter: Payer: Self-pay | Admitting: Family

## 2021-04-16 ENCOUNTER — Ambulatory Visit: Payer: No Typology Code available for payment source | Admitting: Family

## 2021-04-16 VITALS — BP 120/73 | HR 74 | Temp 98.4°F | Resp 16 | Wt 113.0 lb

## 2021-04-16 DIAGNOSIS — E785 Hyperlipidemia, unspecified: Secondary | ICD-10-CM | POA: Diagnosis not present

## 2021-04-16 DIAGNOSIS — Z0000 Encounter for general adult medical examination without abnormal findings: Secondary | ICD-10-CM | POA: Diagnosis not present

## 2021-04-16 LAB — LIPID PANEL
Cholesterol: 236 mg/dL — ABNORMAL HIGH (ref 0–200)
HDL: 89.7 mg/dL (ref >39.00)
LDL Cholesterol: 134 mg/dL — ABNORMAL HIGH (ref 0–99)
NonHDL: 146.09
Total CHOL/HDL Ratio: 3
Triglycerides: 59 mg/dL (ref 0.0–149.0)
VLDL: 11.8 mg/dL (ref 0.0–40.0)

## 2021-04-16 LAB — COMPREHENSIVE METABOLIC PANEL
ALT: 14 U/L (ref 0–35)
AST: 16 U/L (ref 0–37)
Albumin: 4.6 g/dL (ref 3.5–5.2)
Alkaline Phosphatase: 43 U/L (ref 39–117)
BUN: 13 mg/dL (ref 6–23)
CO2: 26 mEq/L (ref 19–32)
Calcium: 9.5 mg/dL (ref 8.4–10.5)
Chloride: 102 mEq/L (ref 96–112)
Creatinine, Ser: 0.71 mg/dL (ref 0.40–1.20)
GFR: 106.27 mL/min (ref >60.00)
Glucose, Bld: 86 mg/dL (ref 70–99)
Potassium: 4.6 mEq/L (ref 3.5–5.1)
Sodium: 137 mEq/L (ref 135–145)
Total Bilirubin: 0.9 mg/dL (ref 0.2–1.2)
Total Protein: 7.1 g/dL (ref 6.0–8.3)

## 2021-04-16 NOTE — Patient Instructions (Signed)
Please complete lab work prior to leaving. Continue your work on healthy diet and regular exercise.

## 2021-04-16 NOTE — Progress Notes (Signed)
Subjective:   By signing my name below, I, [name], attest that this documentation has been prepared under the direction and in the presence of Debbrah Alar NP. 04/16/2021    Patient ID: [name], female    DOB: [birth year], 40 y.o.   MRN: [id number]  Chief Complaint  Patient presents with   Follow-up   Hyperlipidemia    Patient here to get labs, "will like cholesterol check, elevated in the past"    HPI Patient is in today for a comprehensive physical exam.  She complains of fungus on both of her great toes. Its is not bothering her at this time but she is requesting to treat it.  She also complains of an object under her left great toe. It is mildly irritating her at this time. She is requesting to get a metabolic and lipid panel during her next lab work.  She reports that her migraines have recently increased in frequency. She has seen a neurologist to manage her symptoms and was prescribed medication which has helped decrease the frequency of her migraines.  She continues having bladder issues since her hysterectomy and started pelvic floor physical therapy to manage her symptoms.  She denies having any unexpected weight change, ear pain, hearing loss and rhinorrhea, visual disturbance, cough, chest pain and leg swelling, nausea, vomiting, diarrhea and blood in stool, or dysuria and frequency, for myalgias and arthralgias, rash, headaches, adenopathy, depression or anxiety at this time. She has no recent changes in her family medical history. She has no recent surgical changes. She drinking around 2 alcoholic drinks a week. She does not use tobacco products. She does not use any drugs.   Immunizations: She is UTD on tetanus vaccines at this time. She has 2 Covid-19 vaccines at this time.  Diet: She is maintaining a healthy diet.  Exercise: She participates in regular exercise by doing cardio and weight lifting.  Mammogram: Last completed 10/12/2020.  Dental: She is  UTD on dental care.  Vision: She is UTD on vision care.  Pap: s/p hysterectomy   Health Maintenance Due  Topic Date Due   COVID-19 Vaccine (3 - Booster for [employer] series) 02/21/2020   INFLUENZA VACCINE  03/22/2021    Past Medical History:  Diagnosis Date   Abnormal Pap smear of cervix    2008 or 2009   Anxiety    Dental bridge present    lower front   Dry eyes    Endometriosis    Headache    Migraines   History of indigestion    treat with OTC   Hypoglycemia    Irritable bowel syndrome (IBS)    Seasonal allergies    Trigger thumb of right hand 11/2013    Past Surgical History:  Procedure Laterality Date   CHOLECYSTECTOMY  06/2015   CHROMOPERTUBATION N/A 05/30/2016   Procedure: CHROMOPERTUBATION;  Surgeon: Megan Salon, MD;  Location: [facility];  Service: Gynecology;  Laterality: N/A;   COLPOSCOPY  2008   CYSTOSCOPY N/A 08/21/2017   Procedure: CYSTOSCOPY;  Surgeon: Megan Salon, MD;  Location: [facility];  Service: Gynecology;  Laterality: N/A;   LABIOPLASTY N/A 08/21/2017   Procedure: LABIAPLASTY;  Surgeon: Megan Salon, MD;  Location: [facility];  Service: Gynecology;  Laterality: N/A;   LAPAROSCOPY N/A 05/30/2016   Procedure: LAPAROSCOPY OPERATIVE WITH LYSIS OF ADHESIONS, EXCISION OF POSSIBLE ENDOMETRIOSIS;  Surgeon: Megan Salon, MD;  Location: [facility];  Service: Gynecology;  Laterality: N/A;   REFRACTIVE  SURGERY Bilateral 02/2015   TONSILLECTOMY  1988   TOTAL LAPAROSCOPIC HYSTERECTOMY WITH SALPINGECTOMY Bilateral 08/21/2017   Procedure: TOTAL LAPAROSCOPIC HYSTERECTOMY WITH SALPINGECTOMY;  Surgeon: Megan Salon, MD;  Location: [facility];  Service: Gynecology;  Laterality: Bilateral;   TRIGGER FINGER RELEASE Right 12/23/2013   Procedure: RIGHT THUMB TRIGGER RELEASE ;  Surgeon: Tennis Must, MD;  Location: [facility];  Service: Orthopedics;  Laterality: Right;    Family History  Problem Relation Age of Onset   Breast cancer Maternal Grandmother 63       died  early 44's   Hyperlipidemia Mother    Heart disease Father        valve replacement, aortic aneurysm   Cancer Paternal Grandfather        Jaw cancer, smoker    Social History   Socioeconomic History   Marital status: Married    Spouse name: Not on file   Number of children: Not on file   Years of education: Not on file   Highest education level: Not on file  Occupational History   Occupation: Surgical tech  Tobacco Use   Smoking status: Former    Types: Cigarettes    Quit date: 08/22/2010    Years since quitting: 10.6   Smokeless tobacco: Never  Vaping Use   Vaping Use: Never used  Substance and Sexual Activity   Alcohol use: Yes    Alcohol/week: 2.0 standard drinks    Types: 2 Standard drinks or equivalent per week    Comment: per week   Drug use: No   Sexual activity: Yes    Partners: Male    Birth control/protection: Surgical    Comment: TLH  Other Topics Concern   Not on file  Social History Narrative   She is surgical tech in the OR   Married   No children   [employer]    Enjoys hiking with dog, exercise for stress relief   Social Determinants of Health   Financial Resource Strain: Not on file  Food Insecurity: Not on file  Transportation Needs: Not on file  Physical Activity: Not on file  Stress: Not on file  Social Connections: Not on file  Intimate Partner Violence: Not on file    Outpatient Medications Prior to Visit  Medication Sig Dispense Refill   Atogepant (QULIPTA) 60 MG TABS Take 60 mg by mouth daily. 30 tablet 5   Carboxymethylcellulose Sod PF 0.5 % SOLN Place 1 drop into both eyes 2 (two) times daily.     Cholecalciferol (VITAMIN D3) 2000 units TABS Take 2,000 Units by mouth at bedtime.     eletriptan (RELPAX) 40 MG tablet Take 1 tablet (40 mg total) by mouth one time as needed for headaches. 30 tablet 1   MELATONIN ER PO Take by mouth.     methocarbamol (ROBAXIN) 500 MG tablet Take 1 tablet (500 mg total) by mouth every 6 (six) hours  as needed for muscle spasms. 30 tablet 5   mirabegron ER (MYRBETRIQ) 25 MG TB24 tablet Take 1 tablet (25 mg total) by mouth daily. One po qd 30 tablet 12   montelukast (SINGULAIR) 10 MG tablet Take 1 tablet (10 mg total) by mouth at bedtime. 90 tablet 3   Multiple Vitamin (MULTIVITAMIN WITH MINERALS) TABS tablet Take 1 tablet by mouth at bedtime.     NONFORMULARY OR COMPOUNDED ITEM Testosterone 8m/0.10ml topical cream.  Apply 0.156mthree times weekly to thighs. Disp: 90 day supply. 1 each  1   Omega-3 Fatty Acids (FISH OIL) 1000 MG CAPS Take 1 capsule (1,000 mg total) by mouth daily. 30 capsule 0   ondansetron (ZOFRAN ODT) 4 MG disintegrating tablet Take 1 tablet (4 mg total) by mouth every 8 (eight) hours as needed for nausea or vomiting. 20 tablet 5   Simethicone 125 MG CAPS Take 125 mg by mouth at bedtime.     sulfamethoxazole-trimethoprim (BACTRIM DS) 800-160 MG tablet Take 1 tablet by mouth 2 (two) times daily. 6 tablet 0   valACYclovir (VALTREX) 1000 MG tablet Take 1 tablet (1,000 mg total) by mouth daily. 90 tablet 4   No facility-administered medications prior to visit.    Allergies  Allergen Reactions   Other     Dermabond.  Topical blisters.    Review of Systems  Constitutional:        (-)unexpected weight change (-)Adenopathy  HENT:  Negative for hearing loss.        (-)Rhinorrhea   Eyes:        (-)Visual disturbance  Respiratory:  Negative for cough.   Cardiovascular:  Negative for chest pain and leg swelling.  Gastrointestinal:  Negative for blood in stool, constipation, diarrhea, nausea and vomiting.  Genitourinary:  Negative for dysuria and frequency.  Musculoskeletal:  Negative for joint pain and myalgias.  Skin:  Negative for rash.       (+)Small mass on bottom of left great toe (+)Discoloration of both great toe nails   Neurological:  Negative for headaches.  Psychiatric/Behavioral:  Negative for depression. The patient is not nervous/anxious.        Objective:    Physical Exam Constitutional:      General: She is not in acute distress.    Appearance: Normal appearance. She is not ill-appearing.  HENT:     Head: Normocephalic and atraumatic.     Right Ear: Tympanic membrane, ear canal and external ear normal.     Left Ear: Tympanic membrane, ear canal and external ear normal.  Eyes:     Extraocular Movements: Extraocular movements intact.     Pupils: Pupils are equal, round, and reactive to light.     Comments: No nystagmus  Cardiovascular:     Rate and Rhythm: Normal rate and regular rhythm.     Heart sounds: Normal heart sounds. No murmur heard.   No gallop.  Pulmonary:     Effort: Pulmonary effort is normal. No respiratory distress.     Breath sounds: Normal breath sounds. No wheezing or rales.  Abdominal:     General: There is no distension.     Palpations: Abdomen is soft.     Tenderness: There is no abdominal tenderness. There is no guarding.  Musculoskeletal:     Comments: 5/5 strength in both upper and lower extremities    Lymphadenopathy:     Cervical: No cervical adenopathy.  Skin:    General: Skin is warm and dry.     Comments: Discoloration of bilateral great toe nails Small corn on planter surface of left great toe  Neurological:     Mental Status: She is alert and oriented to person, place, and time.     Deep Tendon Reflexes:     Reflex Scores:      Patellar reflexes are 2+ on the right side and 2+ on the left side. Psychiatric:        Behavior: Behavior normal.        Judgment: Judgment normal.    BP 120/73 (BP  Location: Right Arm, Patient Position: Sitting, Cuff Size: Small)   Pulse 74   Temp 98.4 F (36.9 C) (Oral)   Resp 16   Wt 113 lb (51.3 kg)   LMP 07/31/2017   SpO2 100%   BMI 21.35 kg/m  Wt Readings from Last 3 Encounters:  04/16/21 113 lb (51.3 kg)  02/03/21 117 lb 9.6 oz (53.3 kg)  12/01/20 115 lb (52.2 kg)       Assessment & Plan:   Problem List Items Addressed This Visit        Unprioritized   Preventative health care - Primary    Encouraged pt to continue healthy diet, exercise.  Obtain labs as ordered. It appears that she may have a toenail fungus. Offered to send nail clipping for confirmation. She wishes to hold off for now.  Also, noted to have a small corn. Recommended corn pads/pumice.  Also recommended a covid booster this fall when new booster becomes available.       Hyperlipidemia   Relevant Orders   Lipid panel   Comp Met (CMET)     No orders of the defined types were placed in this encounter.   I, Debbrah Alar NP, personally preformed the services described in this documentation.  All medical record entries made by the scribe were at my direction and in my presence.  I have reviewed the chart and discharge instructions (if applicable) and agree that the record reflects my personal performance and is accurate and complete. 04/16/2021   I,[name],acting as a Education administrator for Nance Pear, NP.,have documented all relevant documentation on the behalf of Nance Pear, NP,as directed by  Nance Pear, NP while in the presence of Nance Pear, NP.   Nance Pear, NP

## 2021-04-16 NOTE — Assessment & Plan Note (Signed)
Encouraged pt to continue healthy diet, exercise.  Obtain labs as ordered. It appears that she may have a toenail fungus. Offered to send nail clipping for confirmation. She wishes to hold off for now.  Also, noted to have a small corn. Recommended corn pads/pumice.  Also recommended a covid booster this fall when new booster becomes available.

## 2021-04-19 ENCOUNTER — Telehealth (HOSPITAL_BASED_OUTPATIENT_CLINIC_OR_DEPARTMENT_OTHER): Payer: Self-pay | Admitting: Obstetrics & Gynecology

## 2021-04-19 NOTE — Telephone Encounter (Signed)
[facility] called and left a message that the patient has appointment on 04/24/2021 and needs a order and authorization.

## 2021-04-19 NOTE — Telephone Encounter (Signed)
Received notification from [facility] that prior auth was needed for MRI. Prior auth had been obtained for [facility] in the past. Spoke with insurance who updated the approval to be done at [facility].

## 2021-04-24 ENCOUNTER — Ambulatory Visit
Admission: RE | Admit: 2021-04-24 | Discharge: 2021-04-24 | Disposition: A | Discharge status: Home / Self Care | Payer: No Typology Code available for payment source | Source: Ambulatory Visit | Attending: Obstetrics & Gynecology | Admitting: Obstetrics & Gynecology

## 2021-04-24 ENCOUNTER — Other Ambulatory Visit: Payer: Self-pay

## 2021-04-24 DIAGNOSIS — Z803 Family history of malignant neoplasm of breast: Secondary | ICD-10-CM

## 2021-04-24 DIAGNOSIS — Z9189 Other specified personal risk factors, not elsewhere classified: Secondary | ICD-10-CM

## 2021-04-24 MED ORDER — GADOBUTROL 1 MMOL/ML IV SOLN
5.0000 mL | Freq: Once | INTRAVENOUS | Status: AC | PRN
  Administered 2021-04-24: 5 mL via INTRAVENOUS

## 2021-05-12 ENCOUNTER — Encounter: Payer: Self-pay | Admitting: Physical Therapy

## 2021-05-12 ENCOUNTER — Other Ambulatory Visit: Payer: Self-pay

## 2021-05-12 ENCOUNTER — Ambulatory Visit: Payer: No Typology Code available for payment source | Attending: Family | Admitting: Physical Therapy

## 2021-05-12 DIAGNOSIS — R252 Cramp and spasm: Secondary | ICD-10-CM | POA: Insufficient documentation

## 2021-05-12 DIAGNOSIS — M6281 Muscle weakness (generalized): Secondary | ICD-10-CM | POA: Diagnosis present

## 2021-05-12 DIAGNOSIS — R279 Unspecified lack of coordination: Secondary | ICD-10-CM | POA: Diagnosis not present

## 2021-05-12 NOTE — Therapy (Addendum)
[facility] [address] [place], [place], [address] Phone: [phone]   Fax:  [phone]  Physical Therapy Treatment  Patient Details  Name: [name] MRN: [id number] Date of Birth: [birth year] Referring Provider (PT): Megan Salon, MD   Encounter Date: 05/12/2021   PT End of Session - 05/12/21 1633     Visit Number 5    Date for PT Re-Evaluation 06/09/21    Authorization Type medcost    PT Start Time 1552    PT Stop Time 1640    PT Time Calculation (min) 48 min    Activity Tolerance Patient tolerated treatment well    Behavior During Therapy [facility] for tasks assessed/performed             Past Medical History:  Diagnosis Date   Abnormal Pap smear of cervix    2008 or 2009   Anxiety    Dental bridge present    lower front   Dry eyes    Endometriosis    Headache    Migraines   History of indigestion    treat with OTC   Hypoglycemia    Irritable bowel syndrome (IBS)    Seasonal allergies    Trigger thumb of right hand 11/2013    Past Surgical History:  Procedure Laterality Date   CHOLECYSTECTOMY  06/2015   CHROMOPERTUBATION N/A 05/30/2016   Procedure: CHROMOPERTUBATION;  Surgeon: Megan Salon, MD;  Location: [facility];  Service: Gynecology;  Laterality: N/A;   COLPOSCOPY  2008   CYSTOSCOPY N/A 08/21/2017   Procedure: CYSTOSCOPY;  Surgeon: Megan Salon, MD;  Location: [facility];  Service: Gynecology;  Laterality: N/A;   LABIOPLASTY N/A 08/21/2017   Procedure: LABIAPLASTY;  Surgeon: Megan Salon, MD;  Location: [facility];  Service: Gynecology;  Laterality: N/A;   LAPAROSCOPY N/A 05/30/2016   Procedure: LAPAROSCOPY OPERATIVE WITH LYSIS OF ADHESIONS, EXCISION OF POSSIBLE ENDOMETRIOSIS;  Surgeon: Megan Salon, MD;  Location: [facility];  Service: Gynecology;  Laterality: N/A;   REFRACTIVE SURGERY Bilateral 02/2015   TONSILLECTOMY  1988   TOTAL LAPAROSCOPIC HYSTERECTOMY WITH SALPINGECTOMY Bilateral  08/21/2017   Procedure: TOTAL LAPAROSCOPIC HYSTERECTOMY WITH SALPINGECTOMY;  Surgeon: Megan Salon, MD;  Location: [facility];  Service: Gynecology;  Laterality: Bilateral;   TRIGGER FINGER RELEASE Right 12/23/2013   Procedure: RIGHT THUMB TRIGGER RELEASE ;  Surgeon: Tennis Must, MD;  Location: [facility];  Service: Orthopedics;  Laterality: Right;    There were no vitals filed for this visit.   Subjective Assessment - 05/12/21 1554     Subjective Pt feels like she is not running to the bathroom quite as much.  The pain only occurred a couple of times and was brief.    Patient Stated Goals less discomfort and less frequency                               [facility] Adult PT Treatment/Exercise - 05/12/21 0001       Exercises   Exercises Lumbar      Lumbar Exercises: Quadruped   Other Quadruped Lumbar Exercises rocking with kegel and modified leaning on the table;    Other Quadruped Lumbar Exercises child poose with threading, qped with rotation      Manual Therapy   Manual therapy comments pt identity confirmed and informed consent given    Myofascial Release abdominal fascial release central to  bladder and bilateral, lumbar region    Internal Pelvic Floor vaginal canal and urethra fascial release                     PT Education - 05/12/21 1633     Education Details Access Code: [email or web address]    Person(s) Educated Patient    Methods Explanation;Demonstration;Tactile cues;Verbal cues;Handout    Comprehension Verbalized understanding;Returned demonstration                 PT Long Term Goals - 05/12/21 1635       PT LONG TERM GOAL #2   Title Pt will be ind with advanced HEP      PT LONG TERM GOAL #3   Title Pt will report at least 75% less bladder discomfort with full bladder    Baseline 20% better                   Plan - 05/12/21 1634     Clinical Impression Statement Pt was better with less urgency and less pain  since previous visit.  Pt did not have tension in pelvic floor today.  Pt is 20% better and able to hold. Pt is overall doing much better and will work on weaning off medicine for OAB when she returns from trip.  Pt is mostly confident she can be ind with HEP but will keep her episode open for a few more weeks just in case she needs one more follow up.    PT Treatment/Interventions ADLs/Self Care Home Management;Biofeedback;Cryotherapy;Electrical Stimulation;Iontophoresis 78m/ml Dexamethasone;Moist Heat;Neuromuscular re-education;Therapeutic exercise;Therapeutic activities;Patient/family education;Manual techniques;Dry needling;Passive range of motion;Taping    PT Next Visit Plan progress and review HEP if needed    PT Home Exercise Plan Access Code: [email or web address]    Consulted and Agree with Plan of Care Patient             Patient will benefit from skilled therapeutic intervention in order to improve the following deficits and impairments:  Postural dysfunction, Decreased strength, Pain, Increased muscle spasms, Increased fascial restricitons, Decreased range of motion, Decreased coordination  Visit Diagnosis: Unspecified lack of coordination  Muscle weakness (generalized)  Cramp and spasm     Problem List Patient Active Problem List   Diagnosis Date Noted   Preventative health care 04/16/2021   Migraine with aura and without status migrainosus, not intractable 12/01/2020   Urinary urgency 12/01/2020   Increased risk of breast cancer 12/01/2020   HSV-2 (herpes simplex virus 2) infection 12/01/2020   H/O: hysterectomy 11/27/2019   Hyperlipidemia    Endometriosis 05/01/2017   Spasm of bowel 01/29/2016   IBS (irritable bowel syndrome) 01/29/2016    JJule Ser PT 05/12/2021, 4:55 PM  [facility] [address] [place] [address]Phone: [phone]  Fax:  [phone] Name: Anna LAYFIELDMRN:  [id number] of Birth: [birth year] PHYSICAL THERAPY DISCHARGE SUMMARY  Visits from Start of Care: 5  Current functional level related to goals / functional outcomes:   See above goals  Remaining deficits: See above details   Education / Equipment: HEP  Patient agrees to discharge. Patient goals were partially met. Patient is being discharged due to being pleased with the current functional level.  Pt was planning to schedule another visit if needed and did not need anymore, she was feeling better  JGustavus Bryant PT 07/06/21 8:41 AM

## 2021-05-12 NOTE — Patient Instructions (Signed)
Access Code: [email or web address] URL: [email or web address] Date: 05/12/2021 Prepared by: Jari Favre  Exercises Supine Pelvic Floor Contraction - 3 x daily - 7 x weekly - 10 reps - 1 sets - 3 sec hold Standing Bicep Curl with Pelvic Floor Contraction - 1 x daily - 7 x weekly - 1 sets - 10 reps Mini Squat with Pelvic Floor Contraction - 1 x daily - 7 x weekly - 1 sets - 10 reps Thoracic Extension Mobilization with Noodle - 1 x daily - 7 x weekly - 3 sets - 10 reps Seated Thoracic Lumbar Extension - 1 x daily - 7 x weekly - 10 reps - 1 sets - 5 sec hold Quadruped Rock Back into [employer] Up - 1 x daily - 7 x weekly - 3 sets - 10 reps Quadruped Full Range Thoracic Rotation with Reach - 1 x daily - 7 x weekly - 3 sets - 1 reps - 5 breaths hold Quadruped Pelvic Floor Contraction with Weight Shift All Directions - 1 x daily - 7 x weekly - 1 sets - 5 reps - 5-10 sec hold Quadruped Exhale with Pelvic Floor Contraction and Arm Raise - 1 x daily - 7 x weekly - 1 sets - 5 reps

## 2021-05-21 ENCOUNTER — Other Ambulatory Visit: Payer: Self-pay | Admitting: Neurology

## 2021-05-21 MED ORDER — PROPRANOLOL HCL ER 60 MG PO CP24: 60.0000 mg | ORAL_CAPSULE | Freq: Every day | ORAL | 5 refills | Status: DC

## 2021-05-25 ENCOUNTER — Encounter: Payer: No Typology Code available for payment source | Admitting: Physical Therapy

## 2021-06-09 ENCOUNTER — Encounter: Payer: No Typology Code available for payment source | Admitting: Physical Therapy

## 2021-07-22 ENCOUNTER — Encounter (HOSPITAL_BASED_OUTPATIENT_CLINIC_OR_DEPARTMENT_OTHER): Payer: Self-pay | Admitting: Obstetrics & Gynecology

## 2021-07-23 ENCOUNTER — Encounter (HOSPITAL_BASED_OUTPATIENT_CLINIC_OR_DEPARTMENT_OTHER): Payer: Self-pay | Admitting: Obstetrics & Gynecology

## 2021-07-23 ENCOUNTER — Ambulatory Visit (HOSPITAL_BASED_OUTPATIENT_CLINIC_OR_DEPARTMENT_OTHER): Payer: No Typology Code available for payment source | Admitting: Obstetrics & Gynecology

## 2021-07-23 ENCOUNTER — Other Ambulatory Visit: Payer: Self-pay

## 2021-07-23 VITALS — BP 125/91 | HR 64 | Ht 61.0 in | Wt 115.4 lb

## 2021-07-23 DIAGNOSIS — R197 Diarrhea, unspecified: Secondary | ICD-10-CM

## 2021-07-23 DIAGNOSIS — R1031 Right lower quadrant pain: Secondary | ICD-10-CM | POA: Diagnosis not present

## 2021-07-23 DIAGNOSIS — Z90710 Acquired absence of both cervix and uterus: Secondary | ICD-10-CM | POA: Diagnosis not present

## 2021-07-23 LAB — COMPREHENSIVE METABOLIC PANEL
ALT: 17 IU/L (ref 0–32)
AST: 17 IU/L (ref 0–40)
Albumin/Globulin Ratio: 2.5 — ABNORMAL HIGH (ref 1.2–2.2)
Albumin: 5.2 g/dL — ABNORMAL HIGH (ref 3.8–4.8)
Alkaline Phosphatase: 66 IU/L (ref 44–121)
BUN/Creatinine Ratio: 15 (ref 9–23)
BUN: 10 mg/dL (ref 6–24)
Bilirubin Total: 0.4 mg/dL (ref 0.0–1.2)
CO2: 26 mmol/L (ref 20–29)
Calcium: 10.2 mg/dL (ref 8.7–10.2)
Chloride: 101 mmol/L (ref 96–106)
Creatinine, Ser: 0.68 mg/dL (ref 0.57–1.00)
Globulin, Total: 2.1 g/dL (ref 1.5–4.5)
Glucose: 92 mg/dL (ref 70–99)
Potassium: 5.1 mmol/L (ref 3.5–5.2)
Sodium: 141 mmol/L (ref 134–144)
Total Protein: 7.3 g/dL (ref 6.0–8.5)
eGFR: 113 mL/min/{1.73_m2} (ref >59)

## 2021-07-23 LAB — CBC WITH DIFFERENTIAL/PLATELET
Basophils Absolute: 0.1 10*3/uL (ref 0.0–0.2)
Basos: 1 %
EOS (ABSOLUTE): 0.2 10*3/uL (ref 0.0–0.4)
Eos: 2 %
Hematocrit: 41.8 % (ref 34.0–46.6)
Hemoglobin: 14.3 g/dL (ref 11.1–15.9)
Immature Grans (Abs): 0 10*3/uL (ref 0.0–0.1)
Immature Granulocytes: 0 %
Lymphocytes Absolute: 2.7 10*3/uL (ref 0.7–3.1)
Lymphs: 34 %
MCH: 32.4 pg (ref 26.6–33.0)
MCHC: 34.2 g/dL (ref 31.5–35.7)
MCV: 95 fL (ref 79–97)
Monocytes Absolute: 0.7 10*3/uL (ref 0.1–0.9)
Monocytes: 8 %
Neutrophils Absolute: 4.5 10*3/uL (ref 1.4–7.0)
Neutrophils: 55 %
Platelets: 253 10*3/uL (ref 150–450)
RBC: 4.42 x10E6/uL (ref 3.77–5.28)
RDW: 12 % (ref 11.7–15.4)
WBC: 8.1 10*3/uL (ref 3.4–10.8)

## 2021-07-23 NOTE — Progress Notes (Signed)
GYNECOLOGY  VISIT  CC:   abdominal pain  HPI: 40 y.o. G0P0000 Married White or Caucasian female here for complaint of abdominal pain and bloating that seemed to start after having intercourse on Sunday.  This seemed to get a little better.  She was off on Monday.  Symptoms seemed to occur again.  She had some continued bloating.  As well she felt constipated.  On Tuesday morning, she had diarrhea and has just felt not right.  Denies fevers.  Since Tuesday, she's continues to have some intermittent diarrhea.  Also is having some issues with reflux.    Denies pain with urination.  Has done pelvic PT and was able to come off her myrbetriq.  This week, she has had some urinary urgency.  Denies vaginal discharge or vaginal bleeding.  Patient Active Problem List   Diagnosis Date Noted   Preventative health care 04/16/2021   Migraine with aura and without status migrainosus, not intractable 12/01/2020   Urinary urgency 12/01/2020   Increased risk of breast cancer 12/01/2020   HSV-2 (herpes simplex virus 2) infection 12/01/2020   H/O: hysterectomy 11/27/2019   Hyperlipidemia    Endometriosis 05/01/2017   Spasm of bowel 01/29/2016   IBS (irritable bowel syndrome) 01/29/2016    Past Medical History:  Diagnosis Date   Abnormal Pap smear of cervix    2008 or 2009   Anxiety    Dental bridge present    lower front   Dry eyes    Endometriosis    Headache    Migraines   History of indigestion    treat with OTC   Hypoglycemia    Irritable bowel syndrome (IBS)    Seasonal allergies    Trigger thumb of right hand 11/2013    Past Surgical History:  Procedure Laterality Date   CHOLECYSTECTOMY  06/2015   CHROMOPERTUBATION N/A 05/30/2016   Procedure: CHROMOPERTUBATION;  Surgeon: Megan Salon, MD;  Location: [facility];  Service: Gynecology;  Laterality: N/A;   COLPOSCOPY  2008   CYSTOSCOPY N/A 08/21/2017   Procedure: CYSTOSCOPY;  Surgeon: Megan Salon, MD;  Location: [facility];  Service:  Gynecology;  Laterality: N/A;   LABIOPLASTY N/A 08/21/2017   Procedure: LABIAPLASTY;  Surgeon: Megan Salon, MD;  Location: [facility];  Service: Gynecology;  Laterality: N/A;   LAPAROSCOPY N/A 05/30/2016   Procedure: LAPAROSCOPY OPERATIVE WITH LYSIS OF ADHESIONS, EXCISION OF POSSIBLE ENDOMETRIOSIS;  Surgeon: Megan Salon, MD;  Location: [facility];  Service: Gynecology;  Laterality: N/A;   REFRACTIVE SURGERY Bilateral 02/2015   TONSILLECTOMY  1988   TOTAL LAPAROSCOPIC HYSTERECTOMY WITH SALPINGECTOMY Bilateral 08/21/2017   Procedure: TOTAL LAPAROSCOPIC HYSTERECTOMY WITH SALPINGECTOMY;  Surgeon: Megan Salon, MD;  Location: [facility];  Service: Gynecology;  Laterality: Bilateral;   TRIGGER FINGER RELEASE Right 12/23/2013   Procedure: RIGHT THUMB TRIGGER RELEASE ;  Surgeon: Tennis Must, MD;  Location: [facility];  Service: Orthopedics;  Laterality: Right;    MEDS:   Current Outpatient Medications on File Prior to Visit  Medication Sig Dispense Refill   Carboxymethylcellulose Sod PF 0.5 % SOLN Place 1 drop into both eyes 2 (two) times daily.     Cholecalciferol (VITAMIN D3) 2000 units TABS Take 2,000 Units by mouth at bedtime.     eletriptan (RELPAX) 40 MG tablet Take 1 tablet (40 mg total) by mouth one time as needed for headaches. 30 tablet 1   MELATONIN ER PO Take by mouth.  methocarbamol (ROBAXIN) 500 MG tablet Take 1 tablet (500 mg total) by mouth every 6 (six) hours as needed for muscle spasms. 30 tablet 5   montelukast (SINGULAIR) 10 MG tablet Take 1 tablet (10 mg total) by mouth at bedtime. 90 tablet 3   Multiple Vitamin (MULTIVITAMIN WITH MINERALS) TABS tablet Take 1 tablet by mouth at bedtime.     NONFORMULARY OR COMPOUNDED ITEM Testosterone 1mg /0.27ml topical cream.  Apply 0.64ml three times weekly to thighs. Disp: 90 day supply. 1 each 1   Omega-3 Fatty Acids (FISH OIL) 1000 MG CAPS Take 1 capsule (1,000 mg total) by mouth daily. 30 capsule 0   ondansetron (ZOFRAN ODT) 4  MG disintegrating tablet Take 1 tablet (4 mg total) by mouth every 8 (eight) hours as needed for nausea or vomiting. 20 tablet 5   propranolol ER (INDERAL LA) 60 MG 24 hr capsule Take 1 capsule (60 mg total) by mouth daily. 30 capsule 5   Simethicone 125 MG CAPS Take 125 mg by mouth at bedtime.     valACYclovir (VALTREX) 1000 MG tablet Take 1 tablet (1,000 mg total) by mouth daily. 90 tablet 4   Atogepant (QULIPTA) 60 MG TABS Take 60 mg by mouth daily. (Patient not taking: Reported on 07/23/2021) 30 tablet 5   mirabegron ER (MYRBETRIQ) 25 MG TB24 tablet Take 1 tablet (25 mg total) by mouth daily. One po qd (Patient not taking: Reported on 07/23/2021) 30 tablet 12   sulfamethoxazole-trimethoprim (BACTRIM DS) 800-160 MG tablet Take 1 tablet by mouth 2 (two) times daily. (Patient not taking: Reported on 07/23/2021) 6 tablet 0   No current facility-administered medications on file prior to visit.    ALLERGIES: Other  Family History  Problem Relation Age of Onset   Breast cancer Maternal Grandmother 56       died early 31's   Hyperlipidemia Mother    Heart disease Father        valve replacement, aortic aneurysm   Cancer Paternal Grandfather        Jaw cancer, smoker    SH:  married, non smoker  Review of Systems  All other systems reviewed and are negative.  PHYSICAL EXAMINATION:    BP (!) 125/91 (BP Location: Left Arm, Patient Position: Sitting, Cuff Size: Normal)   Pulse 64   Ht 5\' 1"  (1.549 m)   Wt 115 lb 6.4 oz (52.3 kg)   LMP 07/31/2017   BMI 21.80 kg/m     General appearance: alert, cooperative and appears stated age CV:  Regular rate and rhythm Lungs:  clear to auscultation, no wheezes, rales or rhonchi, symmetric air entry Abdomen: soft, non-tender; bowel sounds normal; no masses,  no organomegaly Lymph:  no inguinal LAD noted  Pelvic: External genitalia:  no lesions              Urethra:  normal appearing urethra with no masses, tenderness or lesions               Bartholins and Skenes: normal                 Vagina: normal appearing vagina with normal color and discharge, no lesions              Cervix: absent              Bimanual Exam:  Uterus:  uterus absent              Adnexa: no mass, fullness, tenderness  Chaperone,  Octaviano Batty, CMA, was present for exam.  Assessment/Plan: 1. RLQ abdominal pain - Comprehensive metabolic panel - CBC with Differential/Platelet - gyn ultrasound discussed.  Pt is not sure she wants to proceed with this given normal exam.  Will wait to see what lab results shot.  2. Diarrhea, unspecified type  3. H/O: hysterectomy

## 2021-08-24 NOTE — Progress Notes (Signed)
NEUROLOGY FOLLOW UP OFFICE NOTE  [name] [id number]  Assessment/Plan:   1  Migraine without aura, without status migrainosus, not intractable 2  Migraine with aura 3  Left occipital neuralgia    Migraine prevention:  To further optimize efficacy, increase propranolol ER to 80mg  daily Migraine rescue: Relpax 40mg .  Zofran for nausea Methocarbamol for neck and occipital pain. Recommend left occipital nerve block.  She works with pain specialists and will see if she can have it performed there.  Otherwise, we can schedule it here. Keep headache diary Continue magnesium citrate 400mg /riboflavin 400mg /CoQ10 300mg  daily Follow up 7 months.     Subjective:  [name] is a 41 year old right-handed female who follows up for migraines.  UPDATE: Lenoria Chime was effective but expensive and caused constipation.  She was switched to propranolol 3 months ago.  Not as effective as Qulipta but still significantly improved. Intensity:  3-4/10 Duration:  within an hour (once repeated test) Frequency:  2-3 times first 2 months, 4-5 times last month  However, she continues to have sharp left occipital pain and soreness.    Frequency of abortive medication: no more than 5 days in a month. Current NSAIDS/analgesics:  Excedrin Migraine Current triptans:  Relpax 40mg  Current ergotamine:  none Current anti-emetic:  Zofran 4mg  Current muscle relaxants:  Robaxin 500mg  Q6h PRN Current Antihypertensive medications:  propranolol ER 60mg  daily Current Antidepressant medications:  none Current Anticonvulsant medications:  none Current anti-CGRP:  none Current Vitamins/Herbal/Supplements:  magnesium citrate 400mg  daily, riboflavin 400mg  daily, CoQ10 100mg  TID, melatonin, fish oil, D3 Current Antihistamines/Decongestants:  none Other therapy:  Daith piercing  Hormone/birth control:  none  Caffeine:  1 cup coffee in AM, no more soda Diet:  Does not drink enough water.  Does not skip  meals. Exercise:  Routine Depression:  no; Anxiety:  mild Other pain:  rotator cuff, mild arthritis in knees Sleep hygiene:  improved.  Stopped Ambien.  Now on melatonin  HISTORY:  Onset:  First migraine around age 28.  Just aura without headache.  A couple years later, started more regularly with headache Location:  left sided/temple, 2021 - some left sided neck pain and left-occipital - sore to touch  Quality:  pressure Initial intensity:  Severe.  She denies new headache, thunderclap headache Aura:  fuzzy mass in center of vision lasting 45 minutes.  Not with every migraine. Prodrome:  no Associated symptoms:  Nausea, photophobia, osmophobia. Sometimes neck pain.  Sensitive to touch on back of head. denies associated unilateral numbness or weakness. Initial Duration:  2 hours with Relpax.  May last up to 5-6 days. Initial Frequency:  10 to 12 days a month Initial Frequency of abortive medication: 10-12 days Relpax Triggers:  Change in weather, hormonal Relieving factors:  sometimes an ice pack, resting, sometimes apply pressure to temple Activity:  aggravates    Past NSAIDS/analgesics:  naproxen, tramadol Past abortive triptans:  sumatriptan tab (couldn't function on it) Past abortive ergotamine:  none Past muscle relaxants:  Flexeril at night for neck pain (makes her not feel well the following day. Past anti-emetic:  none Past antihypertensive medications:  none Past antidepressant medications:  trazodone Past anticonvulsant medications:  none Past anti-CGRP:  Qulipta 60mg  (effective but caused constipation), Roselyn Meier Past vitamins/Herbal/Supplements:  none Past antihistamines/decongestants:  Flonase Other treatment:  acupuncture      Family history of headache:  no Patient works as a Passenger transport manager and would like a medication with minimal side effects.  Triptans make her feel fatigue which can affect her work.    PAST MEDICAL HISTORY: Past Medical History:  Diagnosis  Date   Abnormal Pap smear of cervix    2008 or 2009   Anxiety    Dental bridge present    lower front   Dry eyes    Endometriosis    Headache    Migraines   History of indigestion    treat with OTC   Hypoglycemia    Irritable bowel syndrome (IBS)    Seasonal allergies    Trigger thumb of right hand 11/2013    MEDICATIONS: Current Outpatient Medications on File Prior to Visit  Medication Sig Dispense Refill   Atogepant (QULIPTA) 60 MG TABS Take 60 mg by mouth daily. (Patient not taking: Reported on 07/23/2021) 30 tablet 5   Carboxymethylcellulose Sod PF 0.5 % SOLN Place 1 drop into both eyes 2 (two) times daily.     Cholecalciferol (VITAMIN D3) 2000 units TABS Take 2,000 Units by mouth at bedtime.     eletriptan (RELPAX) 40 MG tablet Take 1 tablet (40 mg total) by mouth one time as needed for headaches. 30 tablet 1   MELATONIN ER PO Take by mouth.     methocarbamol (ROBAXIN) 500 MG tablet Take 1 tablet (500 mg total) by mouth every 6 (six) hours as needed for muscle spasms. 30 tablet 5   mirabegron ER (MYRBETRIQ) 25 MG TB24 tablet Take 1 tablet (25 mg total) by mouth daily. One po qd (Patient not taking: Reported on 07/23/2021) 30 tablet 12   montelukast (SINGULAIR) 10 MG tablet Take 1 tablet (10 mg total) by mouth at bedtime. 90 tablet 3   Multiple Vitamin (MULTIVITAMIN WITH MINERALS) TABS tablet Take 1 tablet by mouth at bedtime.     NONFORMULARY OR COMPOUNDED ITEM Testosterone 1mg /0.17ml topical cream.  Apply 0.32ml three times weekly to thighs. Disp: 90 day supply. 1 each 1   Omega-3 Fatty Acids (FISH OIL) 1000 MG CAPS Take 1 capsule (1,000 mg total) by mouth daily. 30 capsule 0   ondansetron (ZOFRAN ODT) 4 MG disintegrating tablet Take 1 tablet (4 mg total) by mouth every 8 (eight) hours as needed for nausea or vomiting. 20 tablet 5   propranolol ER (INDERAL LA) 60 MG 24 hr capsule Take 1 capsule (60 mg total) by mouth daily. 30 capsule 5   Simethicone 125 MG CAPS Take 125 mg by  mouth at bedtime.     sulfamethoxazole-trimethoprim (BACTRIM DS) 800-160 MG tablet Take 1 tablet by mouth 2 (two) times daily. (Patient not taking: Reported on 07/23/2021) 6 tablet 0   valACYclovir (VALTREX) 1000 MG tablet Take 1 tablet (1,000 mg total) by mouth daily. 90 tablet 4   No current facility-administered medications on file prior to visit.    ALLERGIES: Allergies  Allergen Reactions   Other     Dermabond.  Topical blisters.    FAMILY HISTORY: Family History  Problem Relation Age of Onset   Breast cancer Maternal Grandmother 17       died early 55's   Hyperlipidemia Mother    Heart disease Father        valve replacement, aortic aneurysm   Cancer Paternal Grandfather        Jaw cancer, smoker      Objective:  Blood pressure 102/67, pulse 70, height 5\' 1"  (1.549 m), weight 119 lb 6.4 oz (54.2 kg), last menstrual period 07/31/2017, SpO2 98 %. General: No acute distress.  Patient appears  well-groomed.   Head:  Normocephalic/atraumatic.  Tenderness to palpation at left greater and lesser occipital notches Eyes:  Fundi examined but not visualized Neck: supple, no paraspinal tenderness, full range of motion Heart:  Regular rate and rhythm Lungs:  Clear to auscultation bilaterally Back: No paraspinal tenderness Neurological Exam: alert and oriented to person, place, and time.  Speech fluent and not dysarthric, language intact.  CN II-XII intact. Bulk and tone normal, muscle strength 5/5 throughout.  Sensation to light touch intact.  Deep tendon reflexes 2+ throughout, toes downgoing.  Finger to nose testing intact.  Gait normal, Romberg negative.   Metta Clines, DO  CC: Debbrah Alar, NP

## 2021-08-25 ENCOUNTER — Ambulatory Visit: Payer: PRIVATE HEALTH INSURANCE | Admitting: Neurology

## 2021-08-25 ENCOUNTER — Other Ambulatory Visit: Payer: Self-pay

## 2021-08-25 VITALS — BP 102/67 | HR 70 | Ht 61.0 in | Wt 119.4 lb

## 2021-08-25 DIAGNOSIS — M5481 Occipital neuralgia: Secondary | ICD-10-CM | POA: Diagnosis not present

## 2021-08-25 DIAGNOSIS — G43109 Migraine with aura, not intractable, without status migrainosus: Secondary | ICD-10-CM | POA: Diagnosis not present

## 2021-08-25 MED ORDER — PROPRANOLOL HCL ER 80 MG PO CP24: 80.0000 mg | ORAL_CAPSULE | Freq: Every day | ORAL | 5 refills | Status: DC

## 2021-08-25 MED ORDER — METHOCARBAMOL 500 MG PO TABS: 500.0000 mg | ORAL_TABLET | Freq: Four times a day (QID) | ORAL | 5 refills | Status: DC | PRN

## 2021-08-25 NOTE — Patient Instructions (Signed)
Increase propranolol ER to 80mg  daily Methocarbamol refilled Take eletriptan and ondansetron as instructed.  Limit use of pain relievers to no more than 2 days out of week to prevent risk of rebound or medication-overuse headache. Schedule for left occipital nerve block Continue supplements Follow up 7 months

## 2021-08-26 ENCOUNTER — Encounter: Payer: Self-pay | Admitting: Neurology

## 2021-09-01 ENCOUNTER — Encounter: Payer: Self-pay | Admitting: Neurology

## 2021-11-10 ENCOUNTER — Encounter: Payer: Self-pay | Admitting: Neurology

## 2021-11-15 ENCOUNTER — Other Ambulatory Visit: Payer: Self-pay

## 2021-11-15 MED ORDER — NORTRIPTYLINE HCL 10 MG PO CAPS: 10.0000 mg | ORAL_CAPSULE | Freq: Every day | ORAL | 0 refills | Status: DC

## 2021-11-15 NOTE — Progress Notes (Signed)
Per DR.Jaffe patient may discontinue Propranolol.  Instead, start nortriptyline '10mg'$  at bedtime.  We can increase dose in 4 weeks if needed ?

## 2021-12-06 ENCOUNTER — Encounter (HOSPITAL_BASED_OUTPATIENT_CLINIC_OR_DEPARTMENT_OTHER): Payer: Self-pay | Admitting: Obstetrics & Gynecology

## 2021-12-06 ENCOUNTER — Ambulatory Visit (INDEPENDENT_AMBULATORY_CARE_PROVIDER_SITE_OTHER): Payer: PRIVATE HEALTH INSURANCE | Admitting: Obstetrics & Gynecology

## 2021-12-06 VITALS — BP 116/88 | HR 82 | Ht 61.0 in | Wt 116.8 lb

## 2021-12-06 DIAGNOSIS — B009 Herpesviral infection, unspecified: Secondary | ICD-10-CM

## 2021-12-06 DIAGNOSIS — Z9189 Other specified personal risk factors, not elsewhere classified: Secondary | ICD-10-CM

## 2021-12-06 DIAGNOSIS — Z803 Family history of malignant neoplasm of breast: Secondary | ICD-10-CM

## 2021-12-06 DIAGNOSIS — Z01419 Encounter for gynecological examination (general) (routine) without abnormal findings: Secondary | ICD-10-CM | POA: Diagnosis not present

## 2021-12-06 DIAGNOSIS — Z90710 Acquired absence of both cervix and uterus: Secondary | ICD-10-CM

## 2021-12-06 DIAGNOSIS — R234 Changes in skin texture: Secondary | ICD-10-CM

## 2021-12-06 DIAGNOSIS — N9089 Other specified noninflammatory disorders of vulva and perineum: Secondary | ICD-10-CM | POA: Diagnosis not present

## 2021-12-06 MED ORDER — FAMCICLOVIR 250 MG PO TABS: 250.0000 mg | ORAL_TABLET | Freq: Two times a day (BID) | ORAL | 3 refills | Status: DC

## 2021-12-06 MED ORDER — CLOBETASOL PROPIONATE 0.05 % EX OINT: 1.0000 "application " | TOPICAL_OINTMENT | Freq: Two times a day (BID) | CUTANEOUS | 0 refills | Status: AC

## 2021-12-06 MED ORDER — VALACYCLOVIR HCL 1 G PO TABS: 1000.0000 mg | ORAL_TABLET | Freq: Every day | ORAL | 4 refills | Status: DC

## 2021-12-06 NOTE — Progress Notes (Signed)
41 y.o. G0P0000 Married White or Caucasian female here for annual exam.  Did see integrative provider and had blood work.  Has gluten sensitivity.  She is gluten free and this has really helped.  Had Vit D, B12 levels that were low and is on supplement.  Also is on topical testosterone.  Denies vaginal bleeding.   ? ?Patient's last menstrual period was 07/31/2017.          ?Sexually active: Yes.    ?The current method of family planning is status post hysterectomy.    ?Exercising: Yes.    ?Smoker:  no ? ?Health Maintenance: ?Pap:  04/20/2017 Negative ?History of abnormal Pap:  2008 with normal follow up ?MMG:  04/24/2021 MRI of the breast, MMG done 10/13/2021 in Care Everywhere ?Colonoscopy:  guidelines reviewed ?BMD:   guidelines reviewed ?Screening Labs: does with Debbrah Alar, NP ? ? reports that she quit smoking about 11 years ago. Her smoking use included cigarettes. She has never used smokeless tobacco. She reports current alcohol use of about 2.0 standard drinks per week. She reports that she does not use drugs. ? ?Past Medical History:  ?Diagnosis Date  ? Abnormal Pap smear of cervix   ? 2008 or 2009  ? Anxiety   ? Dental bridge present   ? lower front  ? Dry eyes   ? Endometriosis   ? Headache   ? Migraines  ? History of indigestion   ? treat with OTC  ? Hypoglycemia   ? Irritable bowel syndrome (IBS)   ? Seasonal allergies   ? Trigger thumb of right hand 11/2013  ? ? ?Past Surgical History:  ?Procedure Laterality Date  ? CHOLECYSTECTOMY  06/2015  ? CHROMOPERTUBATION N/A 05/30/2016  ? Procedure: CHROMOPERTUBATION;  Surgeon: Megan Salon, MD;  Location: [facility];  Service: Gynecology;  Laterality: N/A;  ? COLPOSCOPY  2008  ? CYSTOSCOPY N/A 08/21/2017  ? Procedure: CYSTOSCOPY;  Surgeon: Megan Salon, MD;  Location: [facility];  Service: Gynecology;  Laterality: N/A;  ? LABIOPLASTY N/A 08/21/2017  ? Procedure: LABIAPLASTY;  Surgeon: Megan Salon, MD;  Location: [facility];  Service: Gynecology;  Laterality: N/A;   ? LAPAROSCOPY N/A 05/30/2016  ? Procedure: LAPAROSCOPY OPERATIVE WITH LYSIS OF ADHESIONS, EXCISION OF POSSIBLE ENDOMETRIOSIS;  Surgeon: Megan Salon, MD;  Location: [facility];  Service: Gynecology;  Laterality: N/A;  ? REFRACTIVE SURGERY Bilateral 02/2015  ? TONSILLECTOMY  1988  ? TOTAL LAPAROSCOPIC HYSTERECTOMY WITH SALPINGECTOMY Bilateral 08/21/2017  ? Procedure: TOTAL LAPAROSCOPIC HYSTERECTOMY WITH SALPINGECTOMY;  Surgeon: Megan Salon, MD;  Location: [facility];  Service: Gynecology;  Laterality: Bilateral;  ? TRIGGER FINGER RELEASE Right 12/23/2013  ? Procedure: RIGHT THUMB TRIGGER RELEASE ;  Surgeon: Tennis Must, MD;  Location: [facility];  Service: Orthopedics;  Laterality: Right;  ? ? ?Current Outpatient Medications  ?Medication Sig Dispense Refill  ? Carboxymethylcellulose Sod PF 0.5 % SOLN Place 1 drop into both eyes 2 (two) times daily.    ? Cholecalciferol (VITAMIN D3) 2000 units TABS Take 2,000 Units by mouth at bedtime.    ? clobetasol ointment (TEMOVATE) 2.72 % Apply 1 application. topically 2 (two) times daily. Apply as directed twice daily.  Stop after two weeks. 60 g 0  ? eletriptan (RELPAX) 40 MG tablet Take 1 tablet (40 mg total) by mouth one time as needed for headaches. 30 tablet 1  ? MELATONIN ER PO Take by mouth.    ? methocarbamol (ROBAXIN) 500 MG tablet  Take 1 tablet (500 mg total) by mouth every 6 (six) hours as needed for muscle spasms. 30 tablet 5  ? montelukast (SINGULAIR) 10 MG tablet Take 1 tablet (10 mg total) by mouth at bedtime. 90 tablet 3  ? Multiple Vitamin (MULTIVITAMIN WITH MINERALS) TABS tablet Take 1 tablet by mouth at bedtime.    ? NONFORMULARY OR COMPOUNDED ITEM Testosterone '1mg'$ /0.57m topical cream.  Apply 0.174mthree times weekly to thighs. ?Disp: 90 day supply. 1 each 1  ? nortriptyline (PAMELOR) 10 MG capsule Take 1 capsule (10 mg total) by mouth at bedtime. 30 capsule 0  ? Omega-3 Fatty Acids (FISH OIL) 1000 MG CAPS Take 1 capsule (1,000 mg total) by mouth  daily. 30 capsule 0  ? ondansetron (ZOFRAN ODT) 4 MG disintegrating tablet Take 1 tablet (4 mg total) by mouth every 8 (eight) hours as needed for nausea or vomiting. 20 tablet 5  ? Simethicone 125 MG CAPS Take 125 mg by mouth at bedtime.    ? valACYclovir (VALTREX) 1000 MG tablet Take 1 tablet (1,000 mg total) by mouth daily. 90 tablet 4  ? ?No current facility-administered medications for this visit.  ? ? ?Family History  ?Problem Relation Age of Onset  ? Breast cancer Maternal Grandmother 2869?     died early 3096's? Hyperlipidemia Mother   ? Heart disease Father   ?     valve replacement, aortic aneurysm  ? Cancer Paternal Grandfather   ?     Jaw cancer, smoker  ? ? ?Review of Systems  ?All other systems reviewed and are negative. ? ?Exam:   ?BP 116/88 (BP Location: Left Arm, Patient Position: Sitting, Cuff Size: Normal)   Pulse 82   Ht '5\' 1"'$  (1.549 m) Comment: reported  Wt 116 lb 12.8 oz (53 kg)   LMP 07/31/2017   BMI 22.07 kg/m?   Height: '5\' 1"'$  (154.9 cm) (reported) ? ?General appearance: alert, cooperative and appears stated age ?Head: Normocephalic, without obvious abnormality, atraumatic ?Neck: no adenopathy, supple, symmetrical, trachea midline and thyroid normal to inspection and palpation ?Lungs: clear to auscultation bilaterally ?Breasts: normal appearance, no masses or tenderness ?Heart: regular rate and rhythm ?Abdomen: soft, non-tender; bowel sounds normal; no masses,  no organomegaly ?Extremities: extremities normal, atraumatic, no cyanosis or edema ?Skin: Skin color, texture, turgor normal. No rashes or lesions ?Lymph nodes: Cervical, supraclavicular, and axillary nodes normal. ?No abnormal inguinal nodes palpated ?Neurologic: Grossly normal ? ? ?Pelvic: External genitalia:  small fissure on perineal body at about 5 o'clock ?             Urethra:  normal appearing urethra with no masses, tenderness or lesions ?             Bartholins and Skenes: normal    ?             Vagina: normal appearing  vagina with normal color and no discharge, no lesions ?             Cervix: absent ?             Pap taken: No. ?Bimanual Exam:  Uterus:  uterus absent ?             Adnexa: normal adnexa ?              Rectovaginal: Confirms ?              Anus:  normal sphincter tone, no lesions ? ?Chaperone, ToKenney Houseman  [place]ells, CMA, was present for exam. ? ?Assessment/Plan: ?1. Well woman exam with routine gynecological exam ?- pap smear not indicated ?- MMG done 09/2021 ?- colonoscopy screening guidelines reviewed ?- BMD screening guidelines reviewed ?- blood work done recently with integrative provider.  Will be scanned into Epic. ?- vaccines reviewed/updated ? ?2. HSV-2 (herpes simplex virus 2) infection ?- on Valtrex for suppressive therapy.  Rx to pharmacy. ? ?3. H/O: hysterectomy ?- on topical testosterone being managed by PCP ? ?4. Family history of breast cancer ? ?5. Increased risk of breast cancer ?- did breast MRI 2022.  Pt considering if will do this year.  Has declined genetic testing ? ?6.  Vulvar lesion ?- will try topical clobetasol 0.05% ointment twice daily for the next two weeks.  Possible skin diagnoses discussed.  Pt will give update after using topical steroid for two weeks. ? ?

## 2021-12-07 ENCOUNTER — Ambulatory Visit (HOSPITAL_BASED_OUTPATIENT_CLINIC_OR_DEPARTMENT_OTHER): Payer: PRIVATE HEALTH INSURANCE | Admitting: Obstetrics & Gynecology

## 2021-12-08 ENCOUNTER — Encounter: Payer: Self-pay | Admitting: Neurology

## 2021-12-09 ENCOUNTER — Encounter (HOSPITAL_BASED_OUTPATIENT_CLINIC_OR_DEPARTMENT_OTHER): Payer: Self-pay | Admitting: *Deleted

## 2021-12-09 ENCOUNTER — Encounter (HOSPITAL_BASED_OUTPATIENT_CLINIC_OR_DEPARTMENT_OTHER): Payer: Self-pay | Admitting: Obstetrics & Gynecology

## 2021-12-10 ENCOUNTER — Other Ambulatory Visit: Payer: Self-pay | Admitting: Neurology

## 2021-12-10 MED ORDER — TOPIRAMATE 25 MG PO TABS: 25.0000 mg | ORAL_TABLET | Freq: Every day | ORAL | 3 refills | Status: DC

## 2021-12-14 ENCOUNTER — Other Ambulatory Visit: Payer: Self-pay | Admitting: Neurology

## 2021-12-24 ENCOUNTER — Encounter (HOSPITAL_BASED_OUTPATIENT_CLINIC_OR_DEPARTMENT_OTHER): Payer: Self-pay | Admitting: Obstetrics & Gynecology

## 2022-01-13 ENCOUNTER — Other Ambulatory Visit: Payer: Self-pay | Admitting: Family

## 2022-02-18 ENCOUNTER — Encounter: Payer: Self-pay | Admitting: Neurology

## 2022-03-04 ENCOUNTER — Ambulatory Visit: Payer: PRIVATE HEALTH INSURANCE | Admitting: Neurology

## 2022-03-08 ENCOUNTER — Encounter (HOSPITAL_BASED_OUTPATIENT_CLINIC_OR_DEPARTMENT_OTHER): Payer: Self-pay | Admitting: Obstetrics & Gynecology

## 2022-03-08 ENCOUNTER — Other Ambulatory Visit (HOSPITAL_BASED_OUTPATIENT_CLINIC_OR_DEPARTMENT_OTHER): Payer: Self-pay | Admitting: Obstetrics & Gynecology

## 2022-03-09 ENCOUNTER — Other Ambulatory Visit (HOSPITAL_BASED_OUTPATIENT_CLINIC_OR_DEPARTMENT_OTHER): Payer: Self-pay | Admitting: Obstetrics & Gynecology

## 2022-03-09 DIAGNOSIS — R102 Pelvic and perineal pain: Secondary | ICD-10-CM

## 2022-03-11 ENCOUNTER — Ambulatory Visit: Payer: PRIVATE HEALTH INSURANCE | Admitting: Neurology

## 2022-03-17 ENCOUNTER — Encounter (HOSPITAL_BASED_OUTPATIENT_CLINIC_OR_DEPARTMENT_OTHER): Payer: Self-pay | Admitting: Obstetrics & Gynecology

## 2022-03-17 ENCOUNTER — Ambulatory Visit (INDEPENDENT_AMBULATORY_CARE_PROVIDER_SITE_OTHER): Payer: PRIVATE HEALTH INSURANCE | Admitting: Obstetrics & Gynecology

## 2022-03-17 VITALS — BP 116/91 | HR 89 | Ht 61.0 in | Wt 111.8 lb

## 2022-03-17 DIAGNOSIS — R1031 Right lower quadrant pain: Secondary | ICD-10-CM | POA: Diagnosis not present

## 2022-03-17 DIAGNOSIS — R6882 Decreased libido: Secondary | ICD-10-CM

## 2022-03-17 DIAGNOSIS — Z87440 Personal history of urinary (tract) infections: Secondary | ICD-10-CM

## 2022-03-17 DIAGNOSIS — R309 Painful micturition, unspecified: Secondary | ICD-10-CM | POA: Diagnosis not present

## 2022-03-17 LAB — POCT URINALYSIS DIPSTICK
Bilirubin, UA: NEGATIVE
Blood, UA: NEGATIVE
Glucose, UA: NEGATIVE
Ketones, UA: NEGATIVE
Leukocytes, UA: NEGATIVE
Nitrite, UA: NEGATIVE
Protein, UA: NEGATIVE
Spec Grav, UA: 1.015 (ref 1.010–1.025)
Urobilinogen, UA: 0.2 E.U./dL
pH, UA: 6 (ref 5.0–8.0)

## 2022-03-17 MED ORDER — NONFORMULARY OR COMPOUNDED ITEM: 1 refills | Status: DC

## 2022-03-17 NOTE — Patient Instructions (Signed)
Female Pelvic [facility] [address]. [place], [place] [address]  [facility] address: [address] [place], [place] [address]  [phone]

## 2022-03-17 NOTE — Addendum Note (Signed)
Addended by: Octaviano Batty B on: 03/17/2022 09:30 AM   Modules accepted: Orders

## 2022-03-17 NOTE — Progress Notes (Signed)
GYNECOLOGY  VISIT  CC:   RLQ pain  HPI: 41 y.o. G0P0000 Married White or Caucasian female here for recurrent RLQ pain.  She had a recent ultrasound showing ovarian cyst on the left.  This was simple and about 4cm.  No dedicated follow up recommended.    She does occasionally have pain with intercourse.  This isn't frequent but is more common if having additional pelvic pain.  Had hysterectomy 2018 with bilateral salpingectomy.  Surgical findings showed endometriosis only on the left side wall and was resection.    She does have hx of recurrent UTIs.  This is actually better than it has been in the past.  She does have prophylactic antibiotics that she took for a while after intercourse but she doesn't really need this any more.  Does not use UTI preventative antibiotic use with intercourse any more.  Does have episodes of painful urination that will just resolve without any medication or antibiotics.    Did have GI evaluation last fall due to change in pain that was more diffuse.  Has C diff present but no toxin.  No active infection.  Treatment not indicated.  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    Denies vaginal bleeding and no abnormal discharge.    She and I have discussed trial of [place] antagonist therapy.  She declined due to possible side effects.  Has done pelvic PT.  This helped some but not completely.  Did see urologist at [facility], Dr. Tresa Moore, in 08/2019.  Was told IC was similar to fibromyalgia with basically no diagnostic testing.  She felt blown off.    We addressed testing again for IC.    Unrelated, would like to try topical testosterone again.  Past Medical History:  Diagnosis Date   Abnormal Pap smear of cervix    2008 or 2009   Anxiety    Dental bridge present    lower front   Dry eyes    Endometriosis    Headache    Migraines   History of indigestion    treat with OTC   Hypoglycemia    Irritable  bowel syndrome (IBS)    Seasonal allergies    Trigger thumb of right hand 11/2013    MEDS:   Current Outpatient Medications on File Prior to Visit  Medication Sig Dispense Refill   Carboxymethylcellulose Sod PF 0.5 % SOLN Place 1 drop into both eyes 2 (two) times daily.     Cholecalciferol (VITAMIN D3) 2000 units TABS Take 2,000 Units by mouth at bedtime.     clobetasol ointment (TEMOVATE) 6.16 % Apply 1 application. topically 2 (two) times daily. Apply as directed twice daily.  Stop after two weeks. 60 g 0   eletriptan (RELPAX) 40 MG tablet Take 1 tablet (40 mg total) by mouth one time as needed for headaches. 30 tablet 1   MELATONIN ER PO Take by mouth.     methocarbamol (ROBAXIN) 500 MG tablet Take 1 tablet (500 mg total) by mouth every 6 (six) hours as needed for muscle spasms. 30 tablet 5   montelukast (SINGULAIR) 10 MG tablet Take 1 tablet (10 mg total) by mouth at bedtime. 90 tablet 3   Multiple Vitamin (MULTIVITAMIN WITH MINERALS) TABS tablet Take 1 tablet by mouth at bedtime.     NONFORMULARY OR COMPOUNDED ITEM Testosterone '1mg'$ /0.89m topical cream.  Apply 0.161mthree times weekly to thighs. Disp: 90 day supply.  1 each 1   Omega-3 Fatty Acids (FISH OIL) 1000 MG CAPS Take 1 capsule (1,000 mg total) by mouth daily. 30 capsule 0   ondansetron (ZOFRAN ODT) 4 MG disintegrating tablet Take 1 tablet (4 mg total) by mouth every 8 (eight) hours as needed for nausea or vomiting. 20 tablet 5   Simethicone 125 MG CAPS Take 125 mg by mouth at bedtime.     valACYclovir (VALTREX) 1000 MG tablet Take 1 tablet (1,000 mg total) by mouth daily. 90 tablet 4   No current facility-administered medications on file prior to visit.    ALLERGIES: Other  SH:  married, non smoker  Review of Systems  Constitutional: Negative.   Genitourinary:  Positive for urgency.    PHYSICAL EXAMINATION:    BP (!) 116/91 (BP Location: Right Arm, Patient Position: Sitting, Cuff Size: Normal)   Pulse 89   Ht '5\' 1"'$   (1.549 m) Comment: reported  Wt 111 lb 12.8 oz (50.7 kg)   LMP 07/31/2017   BMI 21.12 kg/m     General appearance: alert, cooperative and appears stated age Abdomen: soft, non-tender; bowel sounds normal; no masses,  no organomegaly Lymph:  no inguinal LAD noted  Pelvic: External genitalia:  no lesions              Urethra:  normal appearing urethra with no masses, tenderness or lesions              Bartholins and Skenes: normal                 Vagina: normal appearing vagina with normal color and discharge, no lesions              Cervix: absent              Bimanual Exam:  Uterus:  uterus absent              Adnexa: left adnexa about 3cm and tender to palpation  Chaperone, Octaviano Batty, CMA, was present for exam.  Assessment/Plan: 1. RLQ abdominal pain - Ambulatory referral to Urogynecology  2. History of recurrent UTIs  3. Voiding pain  4.  Decreased libido - RF for topical testosterone to [facility]

## 2022-04-18 ENCOUNTER — Encounter: Payer: No Typology Code available for payment source | Admitting: Family

## 2022-04-21 ENCOUNTER — Encounter: Payer: Self-pay | Admitting: Family

## 2022-04-21 ENCOUNTER — Ambulatory Visit (INDEPENDENT_AMBULATORY_CARE_PROVIDER_SITE_OTHER): Payer: PRIVATE HEALTH INSURANCE | Admitting: Family

## 2022-04-21 VITALS — BP 100/74 | HR 77 | Temp 98.1°F | Resp 16 | Wt 112.0 lb

## 2022-04-21 DIAGNOSIS — Z0000 Encounter for general adult medical examination without abnormal findings: Secondary | ICD-10-CM | POA: Diagnosis not present

## 2022-04-21 DIAGNOSIS — R0789 Other chest pain: Secondary | ICD-10-CM

## 2022-04-21 DIAGNOSIS — J302 Other seasonal allergic rhinitis: Secondary | ICD-10-CM | POA: Diagnosis not present

## 2022-04-21 DIAGNOSIS — R739 Hyperglycemia, unspecified: Secondary | ICD-10-CM

## 2022-04-21 DIAGNOSIS — E785 Hyperlipidemia, unspecified: Secondary | ICD-10-CM | POA: Diagnosis not present

## 2022-04-21 LAB — LIPID PANEL
Cholesterol: 224 mg/dL — ABNORMAL HIGH (ref 0–200)
HDL: 95.8 mg/dL (ref >39.00)
LDL Cholesterol: 120 mg/dL — ABNORMAL HIGH (ref 0–99)
NonHDL: 127.72
Total CHOL/HDL Ratio: 2
Triglycerides: 40 mg/dL (ref 0.0–149.0)
VLDL: 8 mg/dL (ref 0.0–40.0)

## 2022-04-21 LAB — HEMOGLOBIN A1C: Hgb A1c MFr Bld: 5.5 % (ref 4.6–6.5)

## 2022-04-21 NOTE — Assessment & Plan Note (Signed)
She reports good response to singulair. Continue same.

## 2022-04-21 NOTE — Assessment & Plan Note (Addendum)
Continue healthy diet, regular exercise. Reviewed outside labs from [facility], had a glucose of 121. Will check A1C today.  She states this was non-fasting. Mammo up to date. Tetanus up to date. Encouraged her to get flu shot and covid booster this fall.

## 2022-04-21 NOTE — Progress Notes (Signed)
Subjective:     Patient ID: [name], female    DOB: 10-11-1980, 41 y.o.   MRN: [id number]  Chief Complaint  Patient presents with   Annual Exam    HPI  Patient presents today for complete physical.  Immunizations: tdap 2020 Diet: healthy Wt Readings from Last 3 Encounters:  04/21/22 112 lb (50.8 kg)  03/17/22 111 lb 12.8 oz (50.7 kg)  12/06/21 116 lb 12.8 oz (53 kg)   She has been going to [facility]. She has cut out wheat which has really helped her migraine and IBS.  Exercise:  3 days a week, cardio and weights and yoga Pap Smear: hysterectomy Mammogram: 2/23 Vision: up to date  Dental: up to date  Health Maintenance Due  Topic Date Due   COVID-19 Vaccine (3 - Pfizer series) 11/19/2019   INFLUENZA VACCINE  03/22/2022    Past Medical History:  Diagnosis Date   Abnormal Pap smear of cervix    2008 or 2009   Anxiety    Dental bridge present    lower front   Dry eyes    Endometriosis    Headache    Migraines   History of indigestion    treat with OTC   Hypoglycemia    Irritable bowel syndrome (IBS)    Seasonal allergies    Trigger thumb of right hand 11/2013    Past Surgical History:  Procedure Laterality Date   CHOLECYSTECTOMY  06/2015   CHROMOPERTUBATION N/A 05/30/2016   Procedure: CHROMOPERTUBATION;  Surgeon: Megan Salon, MD;  Location: [facility];  Service: Gynecology;  Laterality: N/A;   COLPOSCOPY  2008   CYSTOSCOPY N/A 08/21/2017   Procedure: CYSTOSCOPY;  Surgeon: Megan Salon, MD;  Location: [facility];  Service: Gynecology;  Laterality: N/A;   LABIOPLASTY N/A 08/21/2017   Procedure: LABIAPLASTY;  Surgeon: Megan Salon, MD;  Location: [facility];  Service: Gynecology;  Laterality: N/A;   LAPAROSCOPY N/A 05/30/2016   Procedure: LAPAROSCOPY OPERATIVE WITH LYSIS OF ADHESIONS, EXCISION OF POSSIBLE ENDOMETRIOSIS;  Surgeon: Megan Salon, MD;  Location: [facility];  Service: Gynecology;  Laterality: N/A;   REFRACTIVE SURGERY Bilateral  02/2015   TONSILLECTOMY  1988   TOTAL LAPAROSCOPIC HYSTERECTOMY WITH SALPINGECTOMY Bilateral 08/21/2017   Procedure: TOTAL LAPAROSCOPIC HYSTERECTOMY WITH SALPINGECTOMY;  Surgeon: Megan Salon, MD;  Location: [facility];  Service: Gynecology;  Laterality: Bilateral;   TRIGGER FINGER RELEASE Right 12/23/2013   Procedure: RIGHT THUMB TRIGGER RELEASE ;  Surgeon: Tennis Must, MD;  Location: [facility];  Service: Orthopedics;  Laterality: Right;    Family History  Problem Relation Age of Onset   Breast cancer Maternal Grandmother 69       died early 77's   Hyperlipidemia Mother    Heart disease Father        valve replacement, aortic aneurysm   Cancer Paternal Grandfather        Jaw cancer, smoker    Social History   Socioeconomic History   Marital status: Married    Spouse name: Not on file   Number of children: Not on file   Years of education: Not on file   Highest education level: Not on file  Occupational History   Occupation: Surgical tech  Tobacco Use   Smoking status: Former    Types: Cigarettes    Quit date: 08/22/2010    Years since quitting: 11.6   Smokeless tobacco: Never  Vaping Use   Vaping Use:  Never used  Substance and Sexual Activity   Alcohol use: Yes    Alcohol/week: 2.0 standard drinks of alcohol    Types: 2 Standard drinks or equivalent per week    Comment: per week   Drug use: No   Sexual activity: Yes    Partners: Male    Birth control/protection: Surgical    Comment: TLH  Other Topics Concern   Not on file  Social History Narrative   She is surgical tech in the OR   Married   No children   American Bully    Enjoys hiking with dog, exercise for stress relief   Social Determinants of Health   Financial Resource Strain: Low Risk  (05/31/2019)   Overall Financial Resource Strain (CARDIA)    Difficulty of Paying Living Expenses: Not hard at all  Food Insecurity: No Food Insecurity (05/31/2019)   Hunger Vital Sign    Worried About  Running Out of Food in the Last Year: Never true    Ran Out of Food in the Last Year: Never true  Transportation Needs: No Transportation Needs (05/31/2019)   PRAPARE - Hydrologist (Medical): No    Lack of Transportation (Non-Medical): No  Physical Activity: Sufficiently Active (05/31/2019)   Exercise Vital Sign    Days of Exercise per Week: 4 days    Minutes of Exercise per Session: 60 min  Stress: Not on file  Social Connections: Moderately Isolated (05/31/2019)   Social Connection and Isolation Panel [NHANES]    Frequency of Communication with Friends and Family: More than three times a week    Frequency of Social Gatherings with Friends and Family: Once a week    Attends Religious Services: Never    Marine scientist or Organizations: No    Attends Archivist Meetings: Never    Marital Status: Married  Human resources officer Violence: Not At Risk (05/31/2019)   Humiliation, Afraid, Rape, and Kick questionnaire    Fear of Current or Ex-Partner: No    Emotionally Abused: No    Physically Abused: No    Sexually Abused: No    Outpatient Medications Prior to Visit  Medication Sig Dispense Refill   Carboxymethylcellulose Sod PF 0.5 % SOLN Place 1 drop into both eyes 2 (two) times daily.     Cholecalciferol (VITAMIN D3) 2000 units TABS Take 2,000 Units by mouth at bedtime.     clobetasol ointment (TEMOVATE) 3.00 % Apply 1 application. topically 2 (two) times daily. Apply as directed twice daily.  Stop after two weeks. 60 g 0   eletriptan (RELPAX) 40 MG tablet Take 1 tablet (40 mg total) by mouth one time as needed for headaches. 30 tablet 1   KRILL OIL PO Take by mouth.     MELATONIN ER PO Take by mouth.     methocarbamol (ROBAXIN) 500 MG tablet Take 1 tablet (500 mg total) by mouth every 6 (six) hours as needed for muscle spasms. 30 tablet 5   montelukast (SINGULAIR) 10 MG tablet Take 1 tablet (10 mg total) by mouth at bedtime. 90 tablet 3    Multiple Vitamin (MULTIVITAMIN WITH MINERALS) TABS tablet Take 1 tablet by mouth at bedtime.     NONFORMULARY OR COMPOUNDED ITEM Testosterone '1mg'$ /0.72m topical cream.  Apply 0.135mthree times weekly to thighs. Disp: 90 day supply. 1 each 1   ondansetron (ZOFRAN ODT) 4 MG disintegrating tablet Take 1 tablet (4 mg total) by mouth every 8 (eight) hours  as needed for nausea or vomiting. 20 tablet 5   Simethicone 125 MG CAPS Take 125 mg by mouth at bedtime.     valACYclovir (VALTREX) 1000 MG tablet Take 1 tablet (1,000 mg total) by mouth daily. 90 tablet 4   Omega-3 Fatty Acids (FISH OIL) 1000 MG CAPS Take 1 capsule (1,000 mg total) by mouth daily. 30 capsule 0   No facility-administered medications prior to visit.    Allergies  Allergen Reactions   Other     Dermabond.  Topical blisters.    Review of Systems  Constitutional:  Negative for weight loss.  HENT:  Negative for congestion and hearing loss.   Eyes:  Negative for blurred vision.  Respiratory:  Negative for cough.   Cardiovascular:  Negative for leg swelling.       Reports that she had one episode last week of sharp shooting pain across her chest- resolved on it's own. Denied associated SOB.   Gastrointestinal:  Positive for diarrhea (occasional IBS). Negative for constipation.  Genitourinary:  Positive for dysuria (chronic has been referred to urogyn). Negative for frequency.  Musculoskeletal:  Negative for joint pain and myalgias.       Has some trigger fingers on both hands  Skin:  Negative for rash.  Neurological:  Positive for headaches (much better with dietary changes).  Psychiatric/Behavioral:         Denies depression/anxiety       Objective:    Physical Exam  BP 100/74 (BP Location: Right Arm, Patient Position: Sitting, Cuff Size: Small)   Pulse 77   Temp 98.1 F (36.7 C) (Oral)   Resp 16   Wt 112 lb (50.8 kg)   LMP 07/31/2017   SpO2 100%   BMI 21.16 kg/m  Wt Readings from Last 3 Encounters:  04/21/22  112 lb (50.8 kg)  03/17/22 111 lb 12.8 oz (50.7 kg)  12/06/21 116 lb 12.8 oz (53 kg)   Physical Exam  Constitutional: She is oriented to person, place, and time. She appears well-developed and well-nourished. No distress.  HENT:  Head: Normocephalic and atraumatic.  Right Ear: Tympanic membrane and ear canal normal.  Left Ear: Tympanic membrane and ear canal normal.  Mouth/Throat: Oropharynx is clear and moist.  Eyes: Pupils are equal, round, and reactive to light. No scleral icterus.  Neck: Normal range of motion. No thyromegaly present.  Cardiovascular: Normal rate and regular rhythm.   No murmur heard. Pulmonary/Chest: Effort normal and breath sounds normal. No respiratory distress. He has no wheezes. She has no rales. She exhibits no tenderness.  Abdominal: Soft. Bowel sounds are normal. She exhibits no distension and no mass. There is no tenderness. There is no rebound and no guarding.  Musculoskeletal: She exhibits no edema.  Lymphadenopathy:    She has no cervical adenopathy.  Neurological: She is alert and oriented to person, place, and time. She has normal patellar reflexes. She exhibits normal muscle tone. Coordination normal.  Skin: Skin is warm and dry.  Psychiatric: She has a normal mood and affect. Her behavior is normal. Judgment and thought content normal.  Breast/pelvic: deferred           Assessment & Plan:       Assessment & Plan:   Problem List Items Addressed This Visit       Unprioritized   Seasonal allergies    She reports good response to singulair. Continue same.       Preventative health care - Primary  Continue healthy diet, regular exercise. Reviewed outside labs from [facility], had a glucose of 121. Will check A1C today.  She states this was non-fasting. Mammo up to date. Tetanus up to date. Encouraged her to get flu shot and covid booster this fall.        Hyperlipidemia   Relevant Orders   Lipid panel    Atypical chest pain    In regards to the sharp shooting pain she reported that occurred in her chest, it sounds like nerve pain (? Cervical radiculopathy). Will monitor. She will let me know if recurrent/worsening pain occurs.        Other Visit Diagnoses     Hyperglycemia       Relevant Orders   Hemoglobin A1c       I have discontinued Sharine E. Taffe's Fish Oil. I am also having her maintain her Simethicone, Vitamin D3, multivitamin with minerals, Carboxymethylcellulose Sod PF, MELATONIN ER PO, eletriptan, ondansetron, methocarbamol, clobetasol ointment, valACYclovir, montelukast, NONFORMULARY OR COMPOUNDED ITEM, and KRILL OIL PO.  No orders of the defined types were placed in this encounter.

## 2022-04-21 NOTE — Assessment & Plan Note (Signed)
In regards to the sharp shooting pain she reported that occurred in her chest, it sounds like nerve pain (? Cervical radiculopathy). Will monitor. She will let me know if recurrent/worsening pain occurs.

## 2022-06-07 ENCOUNTER — Encounter (HOSPITAL_BASED_OUTPATIENT_CLINIC_OR_DEPARTMENT_OTHER): Payer: Self-pay | Admitting: Obstetrics & Gynecology

## 2022-06-07 ENCOUNTER — Other Ambulatory Visit: Payer: Self-pay | Admitting: Family

## 2022-06-22 ENCOUNTER — Encounter (HOSPITAL_BASED_OUTPATIENT_CLINIC_OR_DEPARTMENT_OTHER): Payer: Self-pay | Admitting: Obstetrics & Gynecology

## 2022-06-22 ENCOUNTER — Ambulatory Visit (INDEPENDENT_AMBULATORY_CARE_PROVIDER_SITE_OTHER): Payer: PRIVATE HEALTH INSURANCE | Admitting: Obstetrics & Gynecology

## 2022-06-22 ENCOUNTER — Other Ambulatory Visit (HOSPITAL_COMMUNITY)
Admission: RE | Admit: 2022-06-22 | Discharge: 2022-06-22 | Disposition: A | Discharge status: Home / Self Care | Payer: PRIVATE HEALTH INSURANCE | Source: Ambulatory Visit | Attending: Obstetrics & Gynecology | Admitting: Obstetrics & Gynecology

## 2022-06-22 VITALS — BP 117/81 | HR 79 | Ht 61.0 in | Wt 112.8 lb

## 2022-06-22 DIAGNOSIS — L292 Pruritus vulvae: Secondary | ICD-10-CM | POA: Diagnosis not present

## 2022-06-22 DIAGNOSIS — N9089 Other specified noninflammatory disorders of vulva and perineum: Secondary | ICD-10-CM | POA: Insufficient documentation

## 2022-06-22 DIAGNOSIS — M6289 Other specified disorders of muscle: Secondary | ICD-10-CM

## 2022-06-22 MED ORDER — IBUPROFEN 800 MG PO TABS: 800.0000 mg | ORAL_TABLET | Freq: Three times a day (TID) | ORAL | 1 refills | Status: DC | PRN

## 2022-06-22 MED ORDER — MOMETASONE FUROATE 0.1 % EX OINT: TOPICAL_OINTMENT | CUTANEOUS | 3 refills | Status: DC

## 2022-06-25 NOTE — Progress Notes (Addendum)
GYNECOLOGY  VISIT  CC:   vulvar biopsy and discuss treatment options  HPI: 41 y.o. G0P0000 Married White or Caucasian female here for biopsy of area that is concerning for lichen sclerosus.  Pt initially treated with topical clobetasol but feeling she is needing this with some more regularity.  Consent for biopsy obtained today.  Will also add mometasone ointment 0.1% twice weekly for maintenance.  Pt has second lesion on outer labia majora that she would like removed today as well.  Pt is having increased pelvic pain.  Did do PT in the past.  Would like to try again but with different provider.  Referral will be made for her to see Austin Miles.  Pt does need RF for ibuprofen for when she is having more discomfort.   Past Medical History:  Diagnosis Date   Abnormal Pap smear of cervix    2008 or 2009   Anxiety    Dental bridge present    lower front   Dry eyes    Endometriosis    Headache    Migraines   History of indigestion    treat with OTC   Hypoglycemia    Irritable bowel syndrome (IBS)    Seasonal allergies    Trigger thumb of right hand 11/2013    MEDS:   Current Outpatient Medications on File Prior to Visit  Medication Sig Dispense Refill   Carboxymethylcellulose Sod PF 0.5 % SOLN Place 1 drop into both eyes 2 (two) times daily.     Cholecalciferol (VITAMIN D3) 2000 units TABS Take 2,000 Units by mouth at bedtime.     clobetasol ointment (TEMOVATE) 6.96 % Apply 1 application. topically 2 (two) times daily. Apply as directed twice daily.  Stop after two weeks. 60 g 0   eletriptan (RELPAX) 40 MG tablet Take 1 tablet (40 mg total) by mouth one time as needed for headaches. 30 tablet 1   KRILL OIL PO Take by mouth.     MELATONIN ER PO Take by mouth.     methocarbamol (ROBAXIN) 500 MG tablet Take 1 tablet (500 mg total) by mouth every 6 (six) hours as needed for muscle spasms. 30 tablet 5   montelukast (SINGULAIR) 10 MG tablet Take 1 tablet (10 mg total) by mouth at  bedtime. 90 tablet 3   Multiple Vitamin (MULTIVITAMIN WITH MINERALS) TABS tablet Take 1 tablet by mouth at bedtime.     NONFORMULARY OR COMPOUNDED ITEM Testosterone '1mg'$ /0.102m topical cream.  Apply 0.127mthree times weekly to thighs. Disp: 90 day supply. 1 each 1   ondansetron (ZOFRAN ODT) 4 MG disintegrating tablet Take 1 tablet (4 mg total) by mouth every 8 (eight) hours as needed for nausea or vomiting. 20 tablet 5   Simethicone 125 MG CAPS Take 125 mg by mouth at bedtime.     valACYclovir (VALTREX) 1000 MG tablet Take 1 tablet (1,000 mg total) by mouth daily. 90 tablet 4   No current facility-administered medications on file prior to visit.    ALLERGIES: Other  SH:  married, non smoker  Review of Systems  Constitutional: Negative.     PHYSICAL EXAMINATION:    BP 117/81 (BP Location: Right Arm, Patient Position: Sitting, Cuff Size: Normal)   Pulse 79   Ht '5\' 1"'$  (1.549 m) Comment: Reported  Wt 112 lb 12.8 oz (51.2 kg)   LMP 07/31/2017   BMI 21.31 kg/m     General appearance: alert, cooperative and appears stated age Lymph:  no inguinal LAD  noted  Pelvic: External genitalia:  area of hypopigmentation of inner lab majora noted.  Pt and I agreed on biopsy site              Urethra:  normal appearing urethra with no masses, tenderness or lesions              Bartholins and Skenes: normal                  Procedure:  Area x 2 cleansed with Betadine.  Sterile technique used throughout procedure.  Skin anesthestized with Lidocaine 1% plain; 1.96m.   3 punch biopsy used to obtain specimen and second lesion fully excised.  Biopsy grasped with pick-ups and excised with scissors.  Adequate hemostasis obtained with silver nitrate sticks.  Dressing was not applied.    Chaperone, TOctaviano Batty CMA, was present for exam.  Assessment/Plan: 1. Vulvar itching - Surgical pathology( [facility]/ [facility]) - pt will use less clobetasol and add mometasone ointment 0.1% topically twice  weekly  2. Vulvar lesion, second location - Surgical pathology( [facility]/ [facility])  3. Pelvic floor dysfunction - Ambulatory referral to Physical Therapy - rx for ibuprofen '800mg'$  every 8 hrs as needed prn pain/discomfort sent to pharmacy.

## 2022-06-29 LAB — SURGICAL PATHOLOGY

## 2022-07-04 NOTE — Progress Notes (Unsigned)
NEUROLOGY FOLLOW UP OFFICE NOTE  [name] [id number]  Assessment/Plan:   1  Migraine without aura, without status migrainosus, not intractable 2  Migraine with aura 3  Left sided occipital neuralgia/cervicogenic headache/cervicalgia - infrequent Migraines improved with gluten-free diet    Migraine prevention:  Not indicated.  Continue gluten-free diet Migraine rescue: Relpax '40mg'$ .  Zofran for nausea  Methocarbamol for neck and occipital pain. If occipital headaches worsen, consider occipital nerve block or pursue MRI of cervical spine Keep headache diary Follow up if headaches worsen.     Subjective:  [name] is a 41 year old right-handed female who follows up for migraines.  UPDATE: Last seen in January.  Propranolol was increased but she continued to have more frequent migraines.  She was changed to nortriptyline in March but she stated that it caused her to feel fatigued.  She was changed to topiramate in April.but discontinued after a month because it didn't make her feel well.  Decided to change to a gluten-free diet and migraines improved.   Intensity:  3-4/10 Duration:  within an hour (once repeated test) Frequency:  3 times a month  However, she continues to have sharp left occipital pain and soreness.  It is sporadic.  Robaxin helps.  Sometimes wakes up in morning lying on her back and feels discomfort  Frequency of abortive medication: no more than 5 days in a month. Current NSAIDS/analgesics:  Excedrin Migraine, ibuprofen '800mg'$  Current triptans:  Relpax '40mg'$  Current ergotamine:  none Current anti-emetic:  Zofran '4mg'$  Current muscle relaxants:  Robaxin '500mg'$  Q6h PRN Current Antihypertensive medications:  none Current Antidepressant medications:  none Current Anticonvulsant medications:  none Current anti-CGRP:  none Current Vitamins/Herbal/Supplements:  magnesium citrate '400mg'$  daily, riboflavin '400mg'$  daily, CoQ10 '100mg'$  TID, melatonin, fish oil,  D3 Current Antihistamines/Decongestants:  none Other therapy:  Daith piercing, physical therapy Hormone/birth control:  none  Caffeine:  1 cup coffee in AM, no more soda Diet:  Does not drink enough water.  Does not skip meals. Exercise:  Routine Depression:  no; Anxiety:  mild Other pain:  rotator cuff, mild arthritis in knees Sleep hygiene:  improved.  Stopped Ambien.  Now on melatonin  HISTORY:  Onset:  First migraine around age 51.  Just aura without headache.  A couple years later, started more regularly with headache Location:  left sided/temple, 2021 - some left sided neck pain and left-occipital - sore to touch  Quality:  pressure Initial intensity:  Severe.  She denies new headache, thunderclap headache Aura:  fuzzy mass in center of vision lasting 45 minutes.  Not with every migraine. Prodrome:  no Associated symptoms:  Nausea, photophobia, osmophobia. Sometimes neck pain.  Sensitive to touch on back of head. denies associated unilateral numbness or weakness. Initial Duration:  2 hours with Relpax.  May last up to 5-6 days. Initial Frequency:  10 to 12 days a month Initial Frequency of abortive medication: 10-12 days Relpax Triggers:  Change in weather, hormonal Relieving factors:  sometimes an ice pack, resting, sometimes apply pressure to temple Activity:  aggravates    Past NSAIDS/analgesics:  naproxen, tramadol Past abortive triptans:  sumatriptan tab (couldn't function on it) Past abortive ergotamine:  none Past muscle relaxants:  Flexeril at night for neck pain (makes her not feel well the following day. Past anti-emetic:  none Past antihypertensive medications:  propranolol  Past antidepressant medications:  nortriptyline (daytime fatigue), trazodone Past anticonvulsant medications:  topiramate (side effects) Past anti-CGRP:  Qulipta '60mg'$  (  effective but caused constipation), Roselyn Meier Past vitamins/Herbal/Supplements:  none Past antihistamines/decongestants:   Flonase Other treatment:  acupuncture      Family history of headache:  no Patient works as a Passenger transport manager and would like a medication with minimal side effects.  Triptans make her feel fatigue which can affect her work.    PAST MEDICAL HISTORY: Past Medical History:  Diagnosis Date   Abnormal Pap smear of cervix    2008 or 2009   Anxiety    Dental bridge present    lower front   Dry eyes    Endometriosis    Headache    Migraines   History of indigestion    treat with OTC   Hypoglycemia    Irritable bowel syndrome (IBS)    Seasonal allergies    Trigger thumb of right hand 11/2013    MEDICATIONS: Current Outpatient Medications on File Prior to Visit  Medication Sig Dispense Refill   Carboxymethylcellulose Sod PF 0.5 % SOLN Place 1 drop into both eyes 2 (two) times daily.     Cholecalciferol (VITAMIN D3) 2000 units TABS Take 2,000 Units by mouth at bedtime.     clobetasol ointment (TEMOVATE) 1.85 % Apply 1 application. topically 2 (two) times daily. Apply as directed twice daily.  Stop after two weeks. 60 g 0   eletriptan (RELPAX) 40 MG tablet Take 1 tablet (40 mg total) by mouth one time as needed for headaches. 30 tablet 1   ibuprofen (ADVIL) 800 MG tablet Take 1 tablet (800 mg total) by mouth every 8 (eight) hours as needed. 30 tablet 1   KRILL OIL PO Take by mouth.     MELATONIN ER PO Take by mouth.     methocarbamol (ROBAXIN) 500 MG tablet Take 1 tablet (500 mg total) by mouth every 6 (six) hours as needed for muscle spasms. 30 tablet 5   mometasone (ELOCON) 0.1 % ointment Apply topically twice weekly 45 g 3   montelukast (SINGULAIR) 10 MG tablet Take 1 tablet (10 mg total) by mouth at bedtime. 90 tablet 3   Multiple Vitamin (MULTIVITAMIN WITH MINERALS) TABS tablet Take 1 tablet by mouth at bedtime.     NONFORMULARY OR COMPOUNDED ITEM Testosterone '1mg'$ /0.70m topical cream.  Apply 0.151mthree times weekly to thighs. Disp: 90 day supply. 1 each 1   ondansetron (ZOFRAN  ODT) 4 MG disintegrating tablet Take 1 tablet (4 mg total) by mouth every 8 (eight) hours as needed for nausea or vomiting. 20 tablet 5   Simethicone 125 MG CAPS Take 125 mg by mouth at bedtime.     valACYclovir (VALTREX) 1000 MG tablet Take 1 tablet (1,000 mg total) by mouth daily. 90 tablet 4   No current facility-administered medications on file prior to visit.    ALLERGIES: Allergies  Allergen Reactions   Other     Dermabond.  Topical blisters.    FAMILY HISTORY: Family History  Problem Relation Age of Onset   Breast cancer Maternal Grandmother 2849     died early 3041's Hyperlipidemia Mother    Heart disease Father        valve replacement, aortic aneurysm   Cancer Paternal Grandfather        Jaw cancer, smoker      Objective:  Blood pressure 121/80, pulse 95, height '5\' 1"'$  (1.549 m), weight 112 lb (50.8 kg), last menstrual period 07/31/2017, SpO2 99 %. General: No acute distress.  Patient appears well-groomed.   Head:  Normocephalic/atraumatic.  Tenderness to palpation at left greater and lesser occipital notches Eyes:  Fundi examined but not visualized Neck: supple, no paraspinal tenderness, full range of motion Heart:  Regular rate and rhythm Neurological Exam: alert and oriented to person, place, and time.  Speech fluent and not dysarthric, language intact.  CN II-XII intact. Bulk and tone normal, muscle strength 5/5 throughout.  Sensation to light touch intact.  Deep tendon reflexes 2+ throughout, toes downgoing.  Finger to nose testing intact.  Gait normal, Romberg negative.   Metta Clines, DO  CC: Debbrah Alar, NP

## 2022-07-05 ENCOUNTER — Encounter: Payer: Self-pay | Admitting: Neurology

## 2022-07-05 ENCOUNTER — Ambulatory Visit (INDEPENDENT_AMBULATORY_CARE_PROVIDER_SITE_OTHER): Payer: PRIVATE HEALTH INSURANCE | Admitting: Neurology

## 2022-07-05 VITALS — BP 121/80 | HR 95 | Ht 61.0 in | Wt 112.0 lb

## 2022-07-05 DIAGNOSIS — G4486 Cervicogenic headache: Secondary | ICD-10-CM | POA: Diagnosis not present

## 2022-07-05 DIAGNOSIS — G43109 Migraine with aura, not intractable, without status migrainosus: Secondary | ICD-10-CM | POA: Diagnosis not present

## 2022-07-05 DIAGNOSIS — M5481 Occipital neuralgia: Secondary | ICD-10-CM

## 2022-08-01 ENCOUNTER — Encounter (HOSPITAL_BASED_OUTPATIENT_CLINIC_OR_DEPARTMENT_OTHER): Payer: Self-pay | Admitting: Obstetrics & Gynecology

## 2022-08-01 ENCOUNTER — Ambulatory Visit: Payer: PRIVATE HEALTH INSURANCE | Attending: Obstetrics & Gynecology | Admitting: Physical Therapy

## 2022-08-01 ENCOUNTER — Other Ambulatory Visit: Payer: Self-pay

## 2022-08-01 ENCOUNTER — Encounter: Payer: Self-pay | Admitting: Physical Therapy

## 2022-08-01 DIAGNOSIS — M6281 Muscle weakness (generalized): Secondary | ICD-10-CM | POA: Diagnosis present

## 2022-08-01 DIAGNOSIS — M6289 Other specified disorders of muscle: Secondary | ICD-10-CM | POA: Insufficient documentation

## 2022-08-01 DIAGNOSIS — R279 Unspecified lack of coordination: Secondary | ICD-10-CM | POA: Diagnosis present

## 2022-08-01 DIAGNOSIS — R252 Cramp and spasm: Secondary | ICD-10-CM | POA: Diagnosis present

## 2022-08-01 NOTE — Therapy (Addendum)
OUTPATIENT PHYSICAL THERAPY FEMALE PELVIC EVALUATION   Patient Name: [name] MRN: [id number] DOB:09/17/1980, 41 y.o., female Today's Date: 08/01/2022  END OF SESSION:  PT End of Session - 08/01/22 1254     Visit Number 1    Date for PT Re-Evaluation 10/24/22    Authorization Type [employer]    PT Start Time 0845    PT Stop Time 0925    PT Time Calculation (min) 40 min    Activity Tolerance Patient tolerated treatment well    Behavior During Therapy [facility] for tasks assessed/performed             Past Medical History:  Diagnosis Date   Abnormal Pap smear of cervix    2008 or 2009   Anxiety    Dental bridge present    lower front   Dry eyes    Endometriosis    Headache    Migraines   History of indigestion    treat with OTC   Hypoglycemia    Irritable bowel syndrome (IBS)    Seasonal allergies    Trigger thumb of right hand 11/2013   Past Surgical History:  Procedure Laterality Date   CHOLECYSTECTOMY  06/2015   CHROMOPERTUBATION N/A 05/30/2016   Procedure: CHROMOPERTUBATION;  Surgeon: Megan Salon, MD;  Location: [facility];  Service: Gynecology;  Laterality: N/A;   COLPOSCOPY  2008   CYSTOSCOPY N/A 08/21/2017   Procedure: CYSTOSCOPY;  Surgeon: Megan Salon, MD;  Location: [facility];  Service: Gynecology;  Laterality: N/A;   LABIOPLASTY N/A 08/21/2017   Procedure: LABIAPLASTY;  Surgeon: Megan Salon, MD;  Location: [facility];  Service: Gynecology;  Laterality: N/A;   LAPAROSCOPY N/A 05/30/2016   Procedure: LAPAROSCOPY OPERATIVE WITH LYSIS OF ADHESIONS, EXCISION OF POSSIBLE ENDOMETRIOSIS;  Surgeon: Megan Salon, MD;  Location: [facility];  Service: Gynecology;  Laterality: N/A;   REFRACTIVE SURGERY Bilateral 02/2015   TONSILLECTOMY  1988   TOTAL LAPAROSCOPIC HYSTERECTOMY WITH SALPINGECTOMY Bilateral 08/21/2017   Procedure: TOTAL LAPAROSCOPIC HYSTERECTOMY WITH SALPINGECTOMY;  Surgeon: Megan Salon, MD;  Location: [facility];  Service: Gynecology;  Laterality: Bilateral;    TRIGGER FINGER RELEASE Right 12/23/2013   Procedure: RIGHT THUMB TRIGGER RELEASE ;  Surgeon: Tennis Must, MD;  Location: [facility];  Service: Orthopedics;  Laterality: Right;   Patient Active Problem List   Diagnosis Date Noted   Seasonal allergies 04/21/2022   Atypical chest pain 04/21/2022   Preventative health care 04/16/2021   Migraine with aura and without status migrainosus, not intractable 12/01/2020   Urinary urgency 12/01/2020   Increased risk of breast cancer 12/01/2020   H/O: hysterectomy 11/27/2019   Hyperlipidemia    Endometriosis 05/01/2017   Spasm of bowel 01/29/2016   IBS (irritable bowel syndrome) 01/29/2016    PCP: Debbrah Alar, NP  REFERRING PROVIDER: Megan Salon, MD   REFERRING DIAG: M62.89 (ICD-10-CM) - Pelvic floor dysfunction   THERAPY DIAG:  R27.9 Unspecified lack of coordination; M62.81 Muscle weakness( generalized) R25.2 Cramp and spasm [name], PT 10/14/22 10:14 AM   Rationale for Evaluation and Treatment: Rehabilitation  ONSET DATE: 2018  SUBJECTIVE:  SUBJECTIVE STATEMENT: No UTI for 1.5 years. Some internal work increased the right lower quadrant pain. Stopped working with Janey Genta in last September of last year. Specific spot on right side that is painful in the right lower quadrant. 2015 fell off a horse and landed on left hip and had therapy. Sometimes pain with urination. Frequent urination.  Fluid intake: Yes: water. Coffee in the morning    PAIN:  Are you having pain? Yes NPRS scale: 6/10 Pain location: Right lower quadrant  Pain type: aching Pain description: intermittent   Aggravating factors: touching the area Relieving factors: ibuprofen, heating pad  PAIN:  Are you having pain? Yes: NPRS scale: 3/10 Pain location:  bladder Pain description: full bladder, pressure Aggravating factors: full bladder, alcohol  Relieving factors: urinate   PRECAUTIONS: None  WEIGHT BEARING RESTRICTIONS: No  FALLS:  Has patient fallen in last 6 months? No  LIVING ENVIRONMENT: Lives with: lives with their family  OCCUPATION: surgical tech in operating room , workouts include yoga, weights, and cardio  PLOF: Independent  PATIENT GOALS: work on right lower quadrant pain  PERTINENT HISTORY:  Endometriosis; IBS, labioplasty, cholecystectomy, laparoscopy with lysis of adhesions and possible endometriosis; Hysterectomy Sexual abuse: No  BOWEL MOVEMENT: Pain with bowel movement: No  URINATION: Pain with urination: Yes sometimes Fully empty bladder: Yes: sometimes has to go a second time to fully empty bladder Stream: Weak compared to before her hysterectomy Urgency: Yes:   Frequency: sometimes every hour,  Leakage: Coughing and Sneezing Pads: No  INTERCOURSE: Pain with intercourse: During Penetration, deep toward the right side Ability to have vaginal penetration:  Yes:   Climax: struggle Marinoff Scale: 2/3   PROLAPSE: None   OBJECTIVE:   DIAGNOSTIC FINDINGS:  Endo confirmed   COGNITION: Overall cognitive status: Within functional limits for tasks assessed     SENSATION: Light touch: Appears intact Proprioception: Appears intact    POSTURE: decreased lumbar lordosis  PELVIC ALIGNMENT: bilateral ASIS are equal  LUMBARAROM/PROM:  A/PROM A/PROM  eval  Extension Decreased by 25%  Left rotation Decreased by 25%   (Blank rows = not tested)  LOWER EXTREMITY ROM: full bilateral hip ROM    LOWER EXTREMITY MMT:  MMT Right eval Left eval  Hip extension 5/5 4/5  Hip abduction 4/5 3+/5   PALPATION:   General  right abdominal has trigger points, increased tenderness located in the right lower abdomen, contraction of abdominals and bulges the lower abdomen, tenderness at the quadratus  by the ribs left worse than right.                 External Perineal Exam intact and good coloring                             Internal Pelvic Floor tenderness located in the right obturator internist, along the right side of the urethra and bladder.   Patient confirms identification and approves PT to assess internal pelvic floor and treatment Yes  PELVIC MMT:   MMT eval  Vaginal 4/5 posterior and 3/5 anterior        TONE: increased  PROLAPSE: none  TODAY'S TREATMENT:  DATE: 08/01/2022  EVAL See below   PATIENT EDUCATION:  08/01/2022 Education details: Access Code: [email or web address] Person educated: Patient Education method: Explanation, Demonstration, Tactile cues, Verbal cues, and Handouts Education comprehension: verbalized understanding, returned demonstration, verbal cues required, tactile cues required, and needs further education  HOME EXERCISE PROGRAM: 08/01/2022 Access Code: [email or web address] URL: [email or web address] Date: 08/01/2022 Prepared by: [name]  Exercises - Supine Diaphragmatic Breathing  - 1 x daily - 7 x weekly - 1 sets - 10 reps - Seated Diaphragmatic Breathing  - 2 x daily - 7 x weekly - 1 sets - 10 reps  ASSESSMENT:  CLINICAL IMPRESSION: Patient is a 41 y.o. female who was seen today for physical therapy evaluation and treatment for pelvic floor dysfunction. Patient reports issues with pelvic floor since  2018, she had her hysterectomy. She was confirmed with endometriosis.  Patient reports her bladder pain is 3/10 when she has a full bladder. Her right lower quadrant pain is 6/10 when touching the area. She has deep pelvic pain with penile penetration vaginally. Sometimes she will have to urinate 2 times to fully empty her bladder due to difficulty relaxing her pelvic floor. She will leak urine with coughing and sneezing.  Patient will contract her upper abdominals and bulge her lower abdomen with abdominal contractions. She has weakness of left hip abduction and extension. Pelvic floor strength is 4/5 posteriorly and 3/5 anteriorly. She has tenderness in the abdomen with right more than left. She has tenderness located in the right pelvic floor muscles. Patient will benefit from skilled therapy to improve pelvic floor coordination and reduce trigger points to reduce pain.   OBJECTIVE IMPAIRMENTS: decreased activity tolerance, decreased coordination, decreased endurance, decreased strength, increased fascial restrictions, increased muscle spasms, impaired tone, and pain.   ACTIVITY LIMITATIONS: carrying, lifting, bending, sitting, standing, sleeping, transfers, continence, and toileting  PARTICIPATION LIMITATIONS: meal prep, cleaning, laundry, interpersonal relationship, shopping, community activity, and occupation  PERSONAL FACTORS: Past/current experiences and 1-2 comorbidities: Endometriosis; IBS, labioplasty, cholecystectomy, laparoscopy with lysis of adhesions and possible endometriosis; Hysterectomy  are also affecting patient's functional outcome.   REHAB POTENTIAL: Excellent  CLINICAL DECISION MAKING: Stable/uncomplicated  EVALUATION COMPLEXITY: Low   GOALS: Goals reviewed with patient? Yes  SHORT TERM GOALS: Target date: 08/29/2022  Patient is independent with initial HEP for hip stretches and bulging of the pelvic floor.  Baseline: Goal status: INITIAL  2.  Patient is able to equally contract the upper and lower abdomen.  Baseline:  Goal status: INITIAL  3.  Patient reports her pain is 25% decreased due to less trigger points in the abdomen and pelvic floor.  Baseline:  Goal status: INITIAL  4.  Patient is able to bulge her pelvic floor to relax.  Baseline:  Goal status: INITIAL   LONG TERM GOALS: Target date: 10/23/3032  Patient independent with advanced HEP for core and hips to reduce  her pain.  Baseline:  Goal status: INITIAL  2.  Patient reports her right lower quadrant pain decreased </= 0-1/10 due to improved muscle lengthening and less trigger points.  Baseline:  Goal status: INITIAL  3.  Patient is able to exercise correctly in the gym with reduction of bulging of the lower abdomen and less strain on the pelvic floor.  Baseline:  Goal status: INITIAL  4.  Patient able to have penile penetration with 0-1 pain level due to decreased trigger points in the pelvic floor.  Baseline:  Goal status: INITIAL  5.  Patient able to sneeze and  cough without urinary leakage due to improve pelvic floor coordination.  Baseline:  Goal status: INITIAL  PLAN:  PT FREQUENCY: 1x/week  PT DURATION: 12 weeks  PLANNED INTERVENTIONS: Therapeutic exercises, Therapeutic activity, Neuromuscular re-education, Patient/Family education, Joint mobilization, Dry Needling, Electrical stimulation, Cryotherapy, Moist heat, Taping, Biofeedback, and Manual therapy  PLAN FOR NEXT SESSION: abdominal manual work especially on the right, work on the pelvic floor internally, work on abdominal contraction to reduce lower abdominal bulge, dry needling the quadratus by T12 and right abdominal    [name], PT 08/01/22 5:10 PM

## 2022-08-26 ENCOUNTER — Encounter: Payer: Self-pay | Admitting: Physical Therapy

## 2022-08-26 ENCOUNTER — Ambulatory Visit: Payer: PRIVATE HEALTH INSURANCE | Attending: Obstetrics & Gynecology | Admitting: Physical Therapy

## 2022-08-26 DIAGNOSIS — M6281 Muscle weakness (generalized): Secondary | ICD-10-CM

## 2022-08-26 DIAGNOSIS — R1031 Right lower quadrant pain: Secondary | ICD-10-CM | POA: Diagnosis not present

## 2022-08-26 DIAGNOSIS — R252 Cramp and spasm: Secondary | ICD-10-CM

## 2022-08-26 DIAGNOSIS — R3915 Urgency of urination: Secondary | ICD-10-CM | POA: Diagnosis not present

## 2022-08-26 DIAGNOSIS — N9412 Deep dyspareunia: Secondary | ICD-10-CM | POA: Diagnosis not present

## 2022-08-26 DIAGNOSIS — R279 Unspecified lack of coordination: Secondary | ICD-10-CM

## 2022-08-26 DIAGNOSIS — M6289 Other specified disorders of muscle: Secondary | ICD-10-CM | POA: Diagnosis not present

## 2022-08-26 NOTE — Therapy (Addendum)
OUTPATIENT PHYSICAL THERAPY TREATMENT NOTE   Patient Name: [name] MRN: [id number] DOB:06-22-1981, 42 y.o., female Today's Date: 08/26/2022  PCP: Debbrah Alar, NP  REFERRING PROVIDER: Megan Salon, MD    END OF SESSION:   PT End of Session - 08/26/22 0932     Visit Number 2    Date for PT Re-Evaluation 10/24/22    Authorization Type [employer]    PT Start Time 0930    PT Stop Time 1010    PT Time Calculation (min) 40 min    Activity Tolerance Patient tolerated treatment well    Behavior During Therapy [facility] for tasks assessed/performed             Past Medical History:  Diagnosis Date   Abnormal Pap smear of cervix    2008 or 2009   Anxiety    Dental bridge present    lower front   Dry eyes    Endometriosis    Headache    Migraines   History of indigestion    treat with OTC   Hypoglycemia    Irritable bowel syndrome (IBS)    Seasonal allergies    Trigger thumb of right hand 11/2013   Past Surgical History:  Procedure Laterality Date   CHOLECYSTECTOMY  06/2015   CHROMOPERTUBATION N/A 05/30/2016   Procedure: CHROMOPERTUBATION;  Surgeon: Megan Salon, MD;  Location: [facility];  Service: Gynecology;  Laterality: N/A;   COLPOSCOPY  2008   CYSTOSCOPY N/A 08/21/2017   Procedure: CYSTOSCOPY;  Surgeon: Megan Salon, MD;  Location: [facility];  Service: Gynecology;  Laterality: N/A;   LABIOPLASTY N/A 08/21/2017   Procedure: LABIAPLASTY;  Surgeon: Megan Salon, MD;  Location: [facility];  Service: Gynecology;  Laterality: N/A;   LAPAROSCOPY N/A 05/30/2016   Procedure: LAPAROSCOPY OPERATIVE WITH LYSIS OF ADHESIONS, EXCISION OF POSSIBLE ENDOMETRIOSIS;  Surgeon: Megan Salon, MD;  Location: [facility];  Service: Gynecology;  Laterality: N/A;   REFRACTIVE SURGERY Bilateral 02/2015   TONSILLECTOMY  1988   TOTAL LAPAROSCOPIC HYSTERECTOMY WITH SALPINGECTOMY Bilateral 08/21/2017   Procedure: TOTAL LAPAROSCOPIC HYSTERECTOMY WITH SALPINGECTOMY;  Surgeon: Megan Salon, MD;   Location: [facility];  Service: Gynecology;  Laterality: Bilateral;   TRIGGER FINGER RELEASE Right 12/23/2013   Procedure: RIGHT THUMB TRIGGER RELEASE ;  Surgeon: Tennis Must, MD;  Location: [facility];  Service: Orthopedics;  Laterality: Right;   Patient Active Problem List   Diagnosis Date Noted   Seasonal allergies 04/21/2022   Atypical chest pain 04/21/2022   Preventative health care 04/16/2021   Migraine with aura and without status migrainosus, not intractable 12/01/2020   Urinary urgency 12/01/2020   Increased risk of breast cancer 12/01/2020   H/O: hysterectomy 11/27/2019   Hyperlipidemia    Endometriosis 05/01/2017   Spasm of bowel 01/29/2016   IBS (irritable bowel syndrome) 01/29/2016   REFERRING DIAG: M62.89 (ICD-10-CM) - Pelvic floor dysfunction    THERAPY DIAG:  R27.9 Unspecified lack of coordination; M62.81 Muscle weakness( generalized) R25.2 Cramp and spasm Earlie Counts, PT 10/14/22 9:34 AM    Rationale for Evaluation and Treatment: Rehabilitation   ONSET DATE: 2018   SUBJECTIVE:  SUBJECTIVE STATEMENT: I did not have any major pain after therapy.  Fluid intake: Yes: water. Coffee in the morning     PAIN:  Are you having pain? Yes NPRS scale: 6/10 Pain location: Right lower quadrant   Pain type: aching Pain description: intermittent    Aggravating factors: touching the area Relieving factors: ibuprofen, heating pad   PAIN:  Are you having pain? Yes: NPRS scale: 3/10 Pain location: bladder Pain description: full bladder, pressure Aggravating factors: full bladder, alcohol  Relieving factors: urinate     PRECAUTIONS: None   WEIGHT BEARING RESTRICTIONS: No   FALLS:  Has patient fallen in last 6 months? No   LIVING ENVIRONMENT: Lives with: lives with  their family   OCCUPATION: surgical tech in operating room , workouts include yoga, weights, and cardio   PLOF: Independent   PATIENT GOALS: work on right lower quadrant pain   PERTINENT HISTORY:  Endometriosis; IBS, labioplasty, cholecystectomy, laparoscopy with lysis of adhesions and possible endometriosis; Hysterectomy Sexual abuse: No   BOWEL MOVEMENT: Pain with bowel movement: No   URINATION: Pain with urination: Yes sometimes Fully empty bladder: Yes: sometimes has to go a second time to fully empty bladder Stream: Weak compared to before her hysterectomy Urgency: Yes:   Frequency: sometimes every hour,  Leakage: Coughing and Sneezing Pads: No   INTERCOURSE: Pain with intercourse: During Penetration, deep toward the right side Ability to have vaginal penetration:  Yes:   Climax: struggle Marinoff Scale: 2/3     PROLAPSE: None     OBJECTIVE:    DIAGNOSTIC FINDINGS:  Endo confirmed     COGNITION: Overall cognitive status: Within functional limits for tasks assessed                          SENSATION: Light touch: Appears intact Proprioception: Appears intact       POSTURE: decreased lumbar lordosis   PELVIC ALIGNMENT: bilateral ASIS are equal   LUMBARAROM/PROM:   A/PROM A/PROM  eval  Extension Decreased by 25%  Left rotation Decreased by 25%   (Blank rows = not tested)   LOWER EXTREMITY ROM: full bilateral hip ROM     LOWER EXTREMITY MMT:   MMT Right eval Left eval  Hip extension 5/5 4/5  Hip abduction 4/5 3+/5    PALPATION:   General  right abdominal has trigger points, increased tenderness located in the right lower abdomen, contraction of abdominals and bulges the lower abdomen, tenderness at the quadratus by the ribs left worse than right.                  External Perineal Exam intact and good coloring                             Internal Pelvic Floor tenderness located in the right obturator internist, along the right side of the  urethra and bladder.    Patient confirms identification and approves PT to assess internal pelvic floor and treatment Yes   PELVIC MMT:   MMT eval  Vaginal 4/5 posterior and 3/5 anterior        TONE: increased   PROLAPSE: none   TODAY'S TREATMENT:     08/26/2022 Manual: Soft tissue mobilization: To assess for dry needling Manual work to the right lumbar paraspinals, right quadratus and bilateral abdominals in supine, prone and left sidely laying on a roll Manual work to the  right lower intercostals while laying on left side on foam roll Scar tissue mobilization: Along the umbilicus and educated patient on how to perform at home Trigger Point Dry-Needling  Treatment instructions: Expect mild to moderate muscle soreness. S/S of pneumothorax if dry needled over a lung field, and to seek immediate medical attention should they occur. Patient verbalized understanding of these instructions and education.  Patient Consent Given: Yes Education handout provided: Yes Muscles treated: right lumbar multifidi, right quadratus, bilateral rectus abdominus Electrical stimulation performed: No Parameters: N/A Treatment response/outcome: elongation of muscle and trigger point response  Neuromuscular re-education: Core facilitation: Working on lower abdominal contraction in supine and reclined using breath and tactile cues to not over contract the upper abdominals.                                                                                                                              DATE: 08/01/2022  EVAL See below     PATIENT EDUCATION:  08/26/2022 Education details: Access Code: [email or web address], massage of the lower abdomen Person educated: Patient Education method: Explanation, Demonstration, Tactile cues, Verbal cues, and Handouts Education comprehension: verbalized understanding, returned demonstration, verbal cues required, tactile cues required, and needs further education   HOME  EXERCISE PROGRAM: 08/26/2022 Access Code: [email or web address] URL: [email or web address] Date: 08/26/2022 Prepared by: Earlie Counts  Program Notes massaging the lower abdominals  Exercises - Supine Diaphragmatic Breathing  - 1 x daily - 7 x weekly - 1 sets - 10 reps - Seated Diaphragmatic Breathing  - 2 x daily - 7 x weekly - 1 sets - 10 reps - Seated Lean Back on Swiss Ball  - 1 x daily - 7 x weekly - 1 sets - 10 reps  Patient Education - Trigger Point Dry Needling    ASSESSMENT:   CLINICAL IMPRESSION: Patient is a 42 y.o. female who was seen today for physical therapy  treatment for pelvic floor dysfunction. Patient had softening of her abdominal, quadratus and lumbar muscles after dry needling. She is able to feel the difference of the lower abdominal contraction and how to not over contract the upper abdominals.  Patient will benefit from skilled therapy to improve pelvic floor coordination and reduce trigger points to reduce pain.    OBJECTIVE IMPAIRMENTS: decreased activity tolerance, decreased coordination, decreased endurance, decreased strength, increased fascial restrictions, increased muscle spasms, impaired tone, and pain.    ACTIVITY LIMITATIONS: carrying, lifting, bending, sitting, standing, sleeping, transfers, continence, and toileting   PARTICIPATION LIMITATIONS: meal prep, cleaning, laundry, interpersonal relationship, shopping, community activity, and occupation   PERSONAL FACTORS: Past/current experiences and 1-2 comorbidities: Endometriosis; IBS, labioplasty, cholecystectomy, laparoscopy with lysis of adhesions and possible endometriosis; Hysterectomy  are also affecting patient's functional outcome.    REHAB POTENTIAL: Excellent   CLINICAL DECISION MAKING: Stable/uncomplicated   EVALUATION COMPLEXITY: Low     GOALS: Goals reviewed with patient? Yes   SHORT TERM GOALS: Target date: 08/29/2022   Patient is  independent with initial HEP for hip stretches  and bulging of the pelvic floor.  Baseline: Goal status: INITIAL   2.  Patient is able to equally contract the upper and lower abdomen.  Baseline:  Goal status: INITIAL   3.  Patient reports her pain is 25% decreased due to less trigger points in the abdomen and pelvic floor.  Baseline:  Goal status: INITIAL   4.  Patient is able to bulge her pelvic floor to relax.  Baseline:  Goal status: INITIAL     LONG TERM GOALS: Target date: 10/23/3032   Patient independent with advanced HEP for core and hips to reduce her pain.  Baseline:  Goal status: INITIAL   2.  Patient reports her right lower quadrant pain decreased </= 0-1/10 due to improved muscle lengthening and less trigger points.  Baseline:  Goal status: INITIAL   3.  Patient is able to exercise correctly in the gym with reduction of bulging of the lower abdomen and less strain on the pelvic floor.  Baseline:  Goal status: INITIAL   4.  Patient able to have penile penetration with 0-1 pain level due to decreased trigger points in the pelvic floor.  Baseline:  Goal status: INITIAL   5.  Patient able to sneeze and cough without urinary leakage due to improve pelvic floor coordination.  Baseline:  Goal status: INITIAL   PLAN:   PT FREQUENCY: 1x/week   PT DURATION: 12 weeks   PLANNED INTERVENTIONS: Therapeutic exercises, Therapeutic activity, Neuromuscular re-education, Patient/Family education, Joint mobilization, Dry Needling, Electrical stimulation, Cryotherapy, Moist heat, Taping, Biofeedback, and Manual therapy   PLAN FOR NEXT SESSION: abdominal manual work especially on the right, work on the pelvic floor internally, work on abdominal contraction to reduce lower abdominal bulge, assess the dry needling, work around the umbilicus, dry needling to abdominals Earlie Counts, PT 08/26/22 10:16 AM

## 2022-09-02 ENCOUNTER — Ambulatory Visit: Payer: PRIVATE HEALTH INSURANCE | Admitting: Physical Therapy

## 2022-09-02 ENCOUNTER — Encounter: Payer: Self-pay | Admitting: Physical Therapy

## 2022-09-02 DIAGNOSIS — R252 Cramp and spasm: Secondary | ICD-10-CM

## 2022-09-02 DIAGNOSIS — M6289 Other specified disorders of muscle: Secondary | ICD-10-CM | POA: Diagnosis not present

## 2022-09-02 DIAGNOSIS — M6281 Muscle weakness (generalized): Secondary | ICD-10-CM

## 2022-09-02 DIAGNOSIS — R279 Unspecified lack of coordination: Secondary | ICD-10-CM

## 2022-09-02 NOTE — Therapy (Addendum)
OUTPATIENT PHYSICAL THERAPY TREATMENT NOTE   Patient Name: [name] MRN: [id number] DOB:August 10, 1981, 42 y.o., female Today's Date: 09/02/2022  PCP: Debbrah Alar, NP   REFERRING PROVIDER: Megan Salon, MD   END OF SESSION:   PT End of Session - 09/02/22 0847     Visit Number 3    Date for PT Re-Evaluation 10/24/22    Authorization Type [employer]    PT Start Time 0845    PT Stop Time 0925    PT Time Calculation (min) 40 min    Activity Tolerance Patient tolerated treatment well    Behavior During Therapy [facility] for tasks assessed/performed             Past Medical History:  Diagnosis Date   Abnormal Pap smear of cervix    2008 or 2009   Anxiety    Dental bridge present    lower front   Dry eyes    Endometriosis    Headache    Migraines   History of indigestion    treat with OTC   Hypoglycemia    Irritable bowel syndrome (IBS)    Seasonal allergies    Trigger thumb of right hand 11/2013   Past Surgical History:  Procedure Laterality Date   CHOLECYSTECTOMY  06/2015   CHROMOPERTUBATION N/A 05/30/2016   Procedure: CHROMOPERTUBATION;  Surgeon: Megan Salon, MD;  Location: [facility];  Service: Gynecology;  Laterality: N/A;   COLPOSCOPY  2008   CYSTOSCOPY N/A 08/21/2017   Procedure: CYSTOSCOPY;  Surgeon: Megan Salon, MD;  Location: [facility];  Service: Gynecology;  Laterality: N/A;   LABIOPLASTY N/A 08/21/2017   Procedure: LABIAPLASTY;  Surgeon: Megan Salon, MD;  Location: [facility];  Service: Gynecology;  Laterality: N/A;   LAPAROSCOPY N/A 05/30/2016   Procedure: LAPAROSCOPY OPERATIVE WITH LYSIS OF ADHESIONS, EXCISION OF POSSIBLE ENDOMETRIOSIS;  Surgeon: Megan Salon, MD;  Location: [facility];  Service: Gynecology;  Laterality: N/A;   REFRACTIVE SURGERY Bilateral 02/2015   TONSILLECTOMY  1988   TOTAL LAPAROSCOPIC HYSTERECTOMY WITH SALPINGECTOMY Bilateral 08/21/2017   Procedure: TOTAL LAPAROSCOPIC HYSTERECTOMY WITH SALPINGECTOMY;  Surgeon: Megan Salon, MD;   Location: [facility];  Service: Gynecology;  Laterality: Bilateral;   TRIGGER FINGER RELEASE Right 12/23/2013   Procedure: RIGHT THUMB TRIGGER RELEASE ;  Surgeon: Tennis Must, MD;  Location: [facility];  Service: Orthopedics;  Laterality: Right;   Patient Active Problem List   Diagnosis Date Noted   Seasonal allergies 04/21/2022   Atypical chest pain 04/21/2022   Preventative health care 04/16/2021   Migraine with aura and without status migrainosus, not intractable 12/01/2020   Urinary urgency 12/01/2020   Increased risk of breast cancer 12/01/2020   H/O: hysterectomy 11/27/2019   Hyperlipidemia    Endometriosis 05/01/2017   Spasm of bowel 01/29/2016   IBS (irritable bowel syndrome) 01/29/2016   REFERRING DIAG: M62.89 (ICD-10-CM) - Pelvic floor dysfunction    THERAPY DIAG:  R27.9 Unspecified lack of coordination; M62.81 Muscle weakness( generalized) R25.2 Cramp and spasm Earlie Counts, PT 10/14/22 9:33 AM    Rationale for Evaluation and Treatment: Rehabilitation   ONSET DATE: 2018   SUBJECTIVE:  SUBJECTIVE STATEMENT: I felt very good after last visit. My stomach felt softer. I did the scar tissue. My back felt looser. The right sided pain was flared up.    PAIN:  Are you having pain? Yes NPRS scale: 6/10 Pain location: Right lower quadrant   Pain type: aching Pain description: intermittent    Aggravating factors: touching the area Relieving factors: ibuprofen, heating pad   PAIN:  Are you having pain? Yes: NPRS scale: 3/10 Pain location: bladder Pain description: full bladder, pressure Aggravating factors: full bladder, alcohol  Relieving factors: urinate     PRECAUTIONS: None   WEIGHT BEARING RESTRICTIONS: No   FALLS:  Has patient fallen in last 6 months? No    LIVING ENVIRONMENT: Lives with: lives with their family   OCCUPATION: surgical tech in operating room , workouts include yoga, weights, and cardio   PLOF: Independent   PATIENT GOALS: work on right lower quadrant pain   PERTINENT HISTORY:  Endometriosis; IBS, labioplasty, cholecystectomy, laparoscopy with lysis of adhesions and possible endometriosis; Hysterectomy Sexual abuse: No   BOWEL MOVEMENT: Pain with bowel movement: No   URINATION: Pain with urination: Yes sometimes Fully empty bladder: Yes: sometimes has to go a second time to fully empty bladder Stream: Weak compared to before her hysterectomy Urgency: Yes:   Frequency: sometimes every hour,  Leakage: Coughing and Sneezing Pads: No   INTERCOURSE: Pain with intercourse: During Penetration, deep toward the right side Ability to have vaginal penetration:  Yes:   Climax: struggle Marinoff Scale: 2/3     PROLAPSE: None     OBJECTIVE:    DIAGNOSTIC FINDINGS:  Endo confirmed     COGNITION: Overall cognitive status: Within functional limits for tasks assessed                          SENSATION: Light touch: Appears intact Proprioception: Appears intact       POSTURE: decreased lumbar lordosis   PELVIC ALIGNMENT: bilateral ASIS are equal   LUMBARAROM/PROM:   A/PROM A/PROM  eval  Extension Decreased by 25%  Left rotation Decreased by 25%   (Blank rows = not tested)   LOWER EXTREMITY ROM: full bilateral hip ROM     LOWER EXTREMITY MMT:   MMT Right eval Left eval  Hip extension 5/5 4/5  Hip abduction 4/5 3+/5    PALPATION:   General  right abdominal has trigger points, increased tenderness located in the right lower abdomen, contraction of abdominals and bulges the lower abdomen, tenderness at the quadratus by the ribs left worse than right.                  External Perineal Exam intact and good coloring                             Internal Pelvic Floor tenderness located in the right  obturator internist, along the right side of the urethra and bladder.    Patient confirms identification and approves PT to assess internal pelvic floor and treatment Yes   PELVIC MMT:   MMT eval  Vaginal 4/5 posterior and 3/5 anterior        TONE: increased   PROLAPSE: none   TODAY'S TREATMENT:    09/02/2022 Manual: Soft tissue mobilization: To assess for dry needling Manual work to the right lumbar paraspinals, quadratus, gluteus medius, and rectus abdominus to elongate after dry needling Scar  tissue mobilization: Manual release of the umbilical scar while needle in the scar Internal pelvic floor techniques: No emotional/communication barriers or cognitive limitation. Patient is motivated to learn. Patient understands and agrees with treatment goals and plan. PT explains patient will be examined in standing, sitting, and lying down to see how their muscles and joints work. When they are ready, they will be asked to remove their underwear so PT can examine their perineum. The patient is also given the option of providing their own chaperone as one is not provided in our facility. The patient also has the right and is explained the right to defer or refuse any part of the evaluation or treatment including the internal exam. With the patient's consent, PT will use one gloved finger to gently assess the muscles of the pelvic floor, seeing how well it contracts and relaxes and if there is muscle symmetry. After, the patient will get dressed and PT and patient will discuss exam findings and plan of care. PT and patient discuss plan of care, schedule, attendance policy and HEP activities.  Release of the urogenital diaphragm externally going through the different levels of fascial restrictions Trigger Point Dry-Needling  Treatment instructions: Expect mild to moderate muscle soreness. S/S of pneumothorax if dry needled over a lung field, and to seek immediate medical attention should they occur.  Patient verbalized understanding of these instructions and education.  Patient Consent Given: Yes Education handout provided: Previously provided Muscles treated: right lumbar multifidi, right quadratus, right gluteus medius, right mid rectus, along umbilical scar Electrical stimulation performed: No Parameters: N/A Treatment response/outcome: elongation of muscle and trigger point response  Exercises: Stretches/mobility: Foam roll on ITB bilaterally Foam roll on the hip flexors Foam roll on the piriformis  Strengthening: Supine marching with lower abdominal contraction     08/26/2022 Manual: Soft tissue mobilization: To assess for dry needling Manual work to the right lumbar paraspinals, right quadratus and bilateral abdominals in supine, prone and left sidely laying on a roll Manual work to the right lower intercostals while laying on left side on foam roll Scar tissue mobilization: Along the umbilicus and educated patient on how to perform at home Trigger Point Dry-Needling  Treatment instructions: Expect mild to moderate muscle soreness. S/S of pneumothorax if dry needled over a lung field, and to seek immediate medical attention should they occur. Patient verbalized understanding of these instructions and education.   Patient Consent Given: Yes Education handout provided: Yes Muscles treated: right lumbar multifidi, right quadratus, bilateral rectus abdominus,  Electrical stimulation performed: No Parameters: N/A Treatment response/outcome: elongation of muscle and trigger point response   Neuromuscular re-education: Core facilitation: Working on lower abdominal contraction in supine and reclined using breath and tactile cues to not over contract the upper abdominals.  PATIENT EDUCATION:  09/02/2022 Education details: Access Code: [email or web address], massage  of the lower abdomen Person educated: Patient Education method: Explanation, Demonstration, Tactile cues, Verbal cues, and Handouts Education comprehension: verbalized understanding, returned demonstration, verbal cues required, tactile cues required, and needs further education   HOME EXERCISE PROGRAM: 09/02/2022 Access Code: [email or web address] URL: [email or web address] Date: 09/02/2022 Prepared by: Earlie Counts  Program Notes massaging the lower abdominals  Exercises - - Supine March  - 1 x daily - 3 x weekly - 2 sets - 10 reps      ASSESSMENT:   CLINICAL IMPRESSION: Patient is a 42 y.o. female who was seen today for physical therapy  treatment for pelvic floor dysfunction. Patient is having less pain in the back and abdominals after the dry needling. She was eduated on vaginal wand to perform internal work in the future. Patient is able to contract the lower abdominals on command but will bulge them when laughing. Patient will benefit from skilled therapy to improve pelvic floor coordination and reduce trigger points to reduce pain.    OBJECTIVE IMPAIRMENTS: decreased activity tolerance, decreased coordination, decreased endurance, decreased strength, increased fascial restrictions, increased muscle spasms, impaired tone, and pain.    ACTIVITY LIMITATIONS: carrying, lifting, bending, sitting, standing, sleeping, transfers, continence, and toileting   PARTICIPATION LIMITATIONS: meal prep, cleaning, laundry, interpersonal relationship, shopping, community activity, and occupation   PERSONAL FACTORS: Past/current experiences and 1-2 comorbidities: Endometriosis; IBS, labioplasty, cholecystectomy, laparoscopy with lysis of adhesions and possible endometriosis; Hysterectomy  are also affecting patient's functional outcome.    REHAB POTENTIAL: Excellent   CLINICAL DECISION MAKING: Stable/uncomplicated   EVALUATION COMPLEXITY: Low     GOALS: Goals reviewed with patient? Yes    SHORT TERM GOALS: Target date: 08/29/2022   Patient is independent with initial HEP for hip stretches and bulging of the pelvic floor.  Baseline: Goal status: INITIAL   2.  Patient is able to equally contract the upper and lower abdomen.  Baseline:  Goal status: INITIAL   3.  Patient reports her pain is 25% decreased due to less trigger points in the abdomen and pelvic floor.  Baseline:  Goal status: ongoing 09/02/2022   4.  Patient is able to bulge her pelvic floor to relax.  Baseline:  Goal status: ongoing 09/02/2022     LONG TERM GOALS: Target date: 10/23/3032   Patient independent with advanced HEP for core and hips to reduce her pain.  Baseline:  Goal status: INITIAL   2.  Patient reports her right lower quadrant pain decreased </= 0-1/10 due to improved muscle lengthening and less trigger points.  Baseline:  Goal status: INITIAL   3.  Patient is able to exercise correctly in the gym with reduction of bulging of the lower abdomen and less strain on the pelvic floor.  Baseline:  Goal status: INITIAL   4.  Patient able to have penile penetration with 0-1 pain level due to decreased trigger points in the pelvic floor.  Baseline:  Goal status: INITIAL   5.  Patient able to sneeze and cough without urinary leakage due to improve pelvic floor coordination.  Baseline:  Goal status: INITIAL   PLAN:   PT FREQUENCY: 1x/week   PT DURATION: 12 weeks   PLANNED INTERVENTIONS: Therapeutic exercises, Therapeutic activity, Neuromuscular re-education, Patient/Family education, Joint mobilization, Dry Needling, Electrical stimulation, Cryotherapy, Moist heat, Taping, Biofeedback, and Manual therapy   PLAN FOR NEXT SESSION: dry needle to the hip adductors work on the pelvic floor internally, work on abdominal  contraction to reduce lower abdominal bulge, supine bird dog and dead bug  Earlie Counts, PT 2022-09-29 9:31 AM

## 2022-09-05 ENCOUNTER — Ambulatory Visit: Payer: PRIVATE HEALTH INSURANCE | Admitting: Physical Therapy

## 2022-09-05 ENCOUNTER — Encounter: Payer: Self-pay | Admitting: Physical Therapy

## 2022-09-05 DIAGNOSIS — R252 Cramp and spasm: Secondary | ICD-10-CM

## 2022-09-05 DIAGNOSIS — R279 Unspecified lack of coordination: Secondary | ICD-10-CM

## 2022-09-05 DIAGNOSIS — M6281 Muscle weakness (generalized): Secondary | ICD-10-CM

## 2022-09-05 DIAGNOSIS — M6289 Other specified disorders of muscle: Secondary | ICD-10-CM | POA: Diagnosis not present

## 2022-09-05 NOTE — Therapy (Addendum)
OUTPATIENT PHYSICAL THERAPY TREATMENT NOTE   Patient Name: [name] MRN: [id number] DOB:May 18, 1981, 42 y.o., female Today's Date: 09/05/2022  PCP: Debbrah Alar, NP  REFERRING PROVIDER: Megan Salon, MD   END OF SESSION:   PT End of Session - 09/05/22 1449     Visit Number 4    Date for PT Re-Evaluation 10/24/22    Authorization Type [employer]    PT Start Time 1445    PT Stop Time 1525    PT Time Calculation (min) 40 min    Activity Tolerance Patient tolerated treatment well    Behavior During Therapy [facility] for tasks assessed/performed             Past Medical History:  Diagnosis Date   Abnormal Pap smear of cervix    2008 or 2009   Anxiety    Dental bridge present    lower front   Dry eyes    Endometriosis    Headache    Migraines   History of indigestion    treat with OTC   Hypoglycemia    Irritable bowel syndrome (IBS)    Seasonal allergies    Trigger thumb of right hand 11/2013   Past Surgical History:  Procedure Laterality Date   CHOLECYSTECTOMY  06/2015   CHROMOPERTUBATION N/A 05/30/2016   Procedure: CHROMOPERTUBATION;  Surgeon: Megan Salon, MD;  Location: [facility];  Service: Gynecology;  Laterality: N/A;   COLPOSCOPY  2008   CYSTOSCOPY N/A 08/21/2017   Procedure: CYSTOSCOPY;  Surgeon: Megan Salon, MD;  Location: [facility];  Service: Gynecology;  Laterality: N/A;   LABIOPLASTY N/A 08/21/2017   Procedure: LABIAPLASTY;  Surgeon: Megan Salon, MD;  Location: [facility];  Service: Gynecology;  Laterality: N/A;   LAPAROSCOPY N/A 05/30/2016   Procedure: LAPAROSCOPY OPERATIVE WITH LYSIS OF ADHESIONS, EXCISION OF POSSIBLE ENDOMETRIOSIS;  Surgeon: Megan Salon, MD;  Location: [facility];  Service: Gynecology;  Laterality: N/A;   REFRACTIVE SURGERY Bilateral 02/2015   TONSILLECTOMY  1988   TOTAL LAPAROSCOPIC HYSTERECTOMY WITH SALPINGECTOMY Bilateral 08/21/2017   Procedure: TOTAL LAPAROSCOPIC HYSTERECTOMY WITH SALPINGECTOMY;  Surgeon: Megan Salon, MD;   Location: [facility];  Service: Gynecology;  Laterality: Bilateral;   TRIGGER FINGER RELEASE Right 12/23/2013   Procedure: RIGHT THUMB TRIGGER RELEASE ;  Surgeon: Tennis Must, MD;  Location: [place];  Service: Orthopedics;  Laterality: Right;   Patient Active Problem List   Diagnosis Date Noted   Seasonal allergies 04/21/2022   Atypical chest pain 04/21/2022   Preventative health care 04/16/2021   Migraine with aura and without status migrainosus, not intractable 12/01/2020   Urinary urgency 12/01/2020   Increased risk of breast cancer 12/01/2020   H/O: hysterectomy 11/27/2019   Hyperlipidemia    Endometriosis 05/01/2017   Spasm of bowel 01/29/2016   IBS (irritable bowel syndrome) 01/29/2016   REFERRING DIAG: M62.89 (ICD-10-CM) - Pelvic floor dysfunction    THERAPY DIAG:  R27.9 Unspecified lack of coordination; M62.81 Muscle weakness( generalized) R25.2 Cramp and spasm Earlie Counts, PT 10/14/22 9:33 AM    Rationale for Evaluation and Treatment: Rehabilitation   ONSET DATE: 2018   SUBJECTIVE:  SUBJECTIVE STATEMENT: I felt very good after last visit. I still have right sided pain.   PAIN:  Are you having pain? Yes NPRS scale: 6/10 Pain location: Right lower quadrant   Pain type: aching Pain description: intermittent    Aggravating factors: touching the area Relieving factors: ibuprofen, heating pad   PAIN:  Are you having pain? Yes: NPRS scale: 3/10 Pain location: bladder Pain description: full bladder, pressure Aggravating factors: full bladder, alcohol  Relieving factors: urinate     PRECAUTIONS: None   WEIGHT BEARING RESTRICTIONS: No   FALLS:  Has patient fallen in last 6 months? No   LIVING ENVIRONMENT: Lives with: lives with their family   OCCUPATION:  surgical tech in operating room , workouts include yoga, weights, and cardio   PLOF: Independent   PATIENT GOALS: work on right lower quadrant pain   PERTINENT HISTORY:  Endometriosis; IBS, labioplasty, cholecystectomy, laparoscopy with lysis of adhesions and possible endometriosis; Hysterectomy Sexual abuse: No   BOWEL MOVEMENT: Pain with bowel movement: No   URINATION: Pain with urination: Yes sometimes Fully empty bladder: Yes: sometimes has to go a second time to fully empty bladder Stream: Weak compared to before her hysterectomy Urgency: Yes:   Frequency: sometimes every hour,  Leakage: Coughing and Sneezing Pads: No   INTERCOURSE: Pain with intercourse: During Penetration, deep toward the right side Ability to have vaginal penetration:  Yes:   Climax: struggle Marinoff Scale: 2/3     PROLAPSE: None     OBJECTIVE:    DIAGNOSTIC FINDINGS:  Endo confirmed     COGNITION: Overall cognitive status: Within functional limits for tasks assessed                          SENSATION: Light touch: Appears intact Proprioception: Appears intact       POSTURE: decreased lumbar lordosis   PELVIC ALIGNMENT: bilateral ASIS are equal   LUMBARAROM/PROM:   A/PROM A/PROM  eval  Extension Decreased by 25%  Left rotation Decreased by 25%   (Blank rows = not tested)   LOWER EXTREMITY ROM: full bilateral hip ROM     LOWER EXTREMITY MMT:   MMT Right eval Left eval  Hip extension 5/5 4/5  Hip abduction 4/5 3+/5    PALPATION:   General  right abdominal has trigger points, increased tenderness located in the right lower abdomen, contraction of abdominals and bulges the lower abdomen, tenderness at the quadratus by the ribs left worse than right.                  External Perineal Exam intact and good coloring                             Internal Pelvic Floor tenderness located in the right obturator internist, along the right side of the urethra and bladder.     Patient confirms identification and approves PT to assess internal pelvic floor and treatment Yes   PELVIC MMT:   MMT eval 09/05/22  Vaginal 4/5 posterior and 3/5 anterior 4/5 with good hug of therapist finger        TONE: increased   PROLAPSE: none   TODAY'S TREATMENT:    09/05/22 Manual: Soft tissue mobilization: To assess for dry needling to the hip adductors and right side of the rectus Manual work to the rectus on the right and bilateral hip adductors to elongate after dry  needling Internal pelvic floor techniques: No emotional/communication barriers or cognitive limitation. Patient is motivated to learn. Patient understands and agrees with treatment goals and plan. PT explains patient will be examined in standing, sitting, and lying down to see how their muscles and joints work. When they are ready, they will be asked to remove their underwear so PT can examine their perineum. The patient is also given the option of providing their own chaperone as one is not provided in our facility. The patient also has the right and is explained the right to defer or refuse any part of the evaluation or treatment including the internal exam. With the patient's consent, PT will use one gloved finger to gently assess the muscles of the pelvic floor, seeing how well it contracts and relaxes and if there is muscle symmetry. After, the patient will get dressed and PT and patient will discuss exam findings and plan of care. PT and patient discuss plan of care, schedule, attendance policy and HEP activities.  Going through the vaginal canal working on the obturator internist, right side of the puborectalis, perineal body, releasing along the ureter, superior bladder area and along the ovaries.  Trigger Point Dry-Needling  Treatment instructions: Expect mild to moderate muscle soreness. S/S of pneumothorax if dry needled over a lung field, and to seek immediate medical attention should they occur. Patient  verbalized understanding of these instructions and education.  Patient Consent Given: Yes Education handout provided: Previously provided Muscles treated: bilateral hip adductors, right side of umbilicus Electrical stimulation performed: No Parameters: N/A Treatment response/outcome: elongation of muscle and trigger point response  Exercises: Stretches/mobility: ITB with foam roll bil Hip flexor with foam roll Strengthening: Supine marching with red band and holding red band at shoulder height    09/02/2022 Manual: Soft tissue mobilization: To assess for dry needling Manual work to the right lumbar paraspinals, quadratus, gluteus medius, and rectus abdominus to elongate after dry needling Scar tissue mobilization: Manual release of the umbilical scar while needle in the scar Internal pelvic floor techniques: No emotional/communication barriers or cognitive limitation. Patient is motivated to learn. Patient understands and agrees with treatment goals and plan. PT explains patient will be examined in standing, sitting, and lying down to see how their muscles and joints work. When they are ready, they will be asked to remove their underwear so PT can examine their perineum. The patient is also given the option of providing their own chaperone as one is not provided in our facility. The patient also has the right and is explained the right to defer or refuse any part of the evaluation or treatment including the internal exam. With the patient's consent, PT will use one gloved finger to gently assess the muscles of the pelvic floor, seeing how well it contracts and relaxes and if there is muscle symmetry. After, the patient will get dressed and PT and patient will discuss exam findings and plan of care. PT and patient discuss plan of care, schedule, attendance policy and HEP activities.  Release of the urogenital diaphragm externally going through the different levels of fascial  restrictions Trigger Point Dry-Needling  Treatment instructions: Expect mild to moderate muscle soreness. S/S of pneumothorax if dry needled over a lung field, and to seek immediate medical attention should they occur. Patient verbalized understanding of these instructions and education.   Patient Consent Given: Yes Education handout provided: Previously provided Muscles treated: right lumbar multifidi, right quadratus, right gluteus medius, right mid rectus, along umbilical scar  Electrical stimulation performed: No Parameters: N/A Treatment response/outcome: elongation of muscle and trigger point response   Exercises: Stretches/mobility: Foam roll on ITB bilaterally Foam roll on the hip flexors Foam roll on the piriformis  Strengthening: Supine marching with lower abdominal contraction     08/26/2022 Manual: Soft tissue mobilization: To assess for dry needling Manual work to the right lumbar paraspinals, right quadratus and bilateral abdominals in supine, prone and left sidely laying on a roll Manual work to the right lower intercostals while laying on left side on foam roll Scar tissue mobilization: Along the umbilicus and educated patient on how to perform at home Trigger Point Dry-Needling  Treatment instructions: Expect mild to moderate muscle soreness. S/S of pneumothorax if dry needled over a lung field, and to seek immediate medical attention should they occur. Patient verbalized understanding of these instructions and education.   Patient Consent Given: Yes Education handout provided: Yes Muscles treated: right lumbar multifidi, right quadratus, bilateral rectus abdominus,  Electrical stimulation performed: No Parameters: N/A Treatment response/outcome: elongation of muscle and trigger point response   Neuromuscular re-education: Core facilitation: Working on lower abdominal contraction in supine and reclined using breath and tactile cues to not over contract the upper  abdominals.                                                                                                                                PATIENT EDUCATION:  09/02/2022 Education details: Access Code: [email or web address], massage of the lower abdomen Person educated: Patient Education method: Explanation, Demonstration, Tactile cues, Verbal cues, and Handouts Education comprehension: verbalized understanding, returned demonstration, verbal cues required, tactile cues required, and needs further education   HOME EXERCISE PROGRAM: 09/02/2022 Access Code: [email or web address] URL: [email or web address] Date: 09/02/2022 Prepared by: Earlie Counts   Program Notes massaging the lower abdominals   Exercises - - Supine March  - 1 x daily - 3 x weekly - 2 sets - 10 reps       ASSESSMENT:   CLINICAL IMPRESSION: Patient is a 42 y.o. female who was seen today for physical therapy  treatment for pelvic floor dysfunction. Patient has fascial restrictions around the superior bladder, around the ovaries and urethra. Patient has less trigger points in the abdomen. She is able to engage the abdominals better and equally. Pelvic floor strength is 4/5 and she is able to fully relax the muscles. She is able to bulge her pelvic floor. Patient will benefit from skilled therapy to improve pelvic floor coordination and reduce trigger points to reduce pain.    OBJECTIVE IMPAIRMENTS: decreased activity tolerance, decreased coordination, decreased endurance, decreased strength, increased fascial restrictions, increased muscle spasms, impaired tone, and pain.    ACTIVITY LIMITATIONS: carrying, lifting, bending, sitting, standing, sleeping, transfers, continence, and toileting   PARTICIPATION LIMITATIONS: meal prep, cleaning, laundry, interpersonal relationship, shopping, community activity, and occupation   PERSONAL FACTORS: Past/current experiences and 1-2 comorbidities: Endometriosis; IBS, labioplasty, cholecystectomy,  laparoscopy with  lysis of adhesions and possible endometriosis; Hysterectomy  are also affecting patient's functional outcome.    REHAB POTENTIAL: Excellent   CLINICAL DECISION MAKING: Stable/uncomplicated   EVALUATION COMPLEXITY: Low     GOALS: Goals reviewed with patient? Yes   SHORT TERM GOALS: Target date: 08/29/2022   Patient is independent with initial HEP for hip stretches and bulging of the pelvic floor.  Baseline: Goal status: Met 09/05/22   2.  Patient is able to equally contract the upper and lower abdomen.  Baseline:  Goal status: Met 09/05/22   3.  Patient reports her pain is 25% decreased due to less trigger points in the abdomen and pelvic floor.  Baseline:  Goal status: ongoing 09/02/2022   4.  Patient is able to bulge her pelvic floor to relax.  Baseline:  Goal status: Met 09/05/2022     LONG TERM GOALS: Target date: 10/23/3032   Patient independent with advanced HEP for core and hips to reduce her pain.  Baseline:  Goal status: INITIAL   2.  Patient reports her right lower quadrant pain decreased </= 0-1/10 due to improved muscle lengthening and less trigger points.  Baseline:  Goal status: INITIAL   3.  Patient is able to exercise correctly in the gym with reduction of bulging of the lower abdomen and less strain on the pelvic floor.  Baseline:  Goal status: INITIAL   4.  Patient able to have penile penetration with 0-1 pain level due to decreased trigger points in the pelvic floor.  Baseline:  Goal status: INITIAL   5.  Patient able to sneeze and cough without urinary leakage due to improve pelvic floor coordination.  Baseline:  Goal status: INITIAL   PLAN:   PT FREQUENCY: 1x/week   PT DURATION: 12 weeks   PLANNED INTERVENTIONS: Therapeutic exercises, Therapeutic activity, Neuromuscular re-education, Patient/Family education, Joint mobilization, Dry Needling, Electrical stimulation, Cryotherapy, Moist heat, Taping, Biofeedback, and Manual  therapy   PLAN FOR NEXT SESSION:  supine bird dog and dead bug; internal work along the bladder and ovaries, work on left side of pelvic floor.    Earlie Counts, PT 09/05/22 5:09 PM

## 2022-09-14 ENCOUNTER — Encounter: Payer: PRIVATE HEALTH INSURANCE | Admitting: Physical Therapy

## 2022-09-19 ENCOUNTER — Ambulatory Visit: Payer: PRIVATE HEALTH INSURANCE | Admitting: Physical Therapy

## 2022-09-20 ENCOUNTER — Encounter: Payer: Self-pay | Admitting: Family

## 2022-09-20 ENCOUNTER — Ambulatory Visit: Payer: PRIVATE HEALTH INSURANCE | Admitting: Family

## 2022-09-20 VITALS — BP 109/78 | HR 79 | Temp 98.1°F | Resp 16 | Wt 112.0 lb

## 2022-09-20 DIAGNOSIS — U071 COVID-19: Secondary | ICD-10-CM | POA: Diagnosis not present

## 2022-09-20 DIAGNOSIS — Z0184 Encounter for antibody response examination: Secondary | ICD-10-CM | POA: Diagnosis not present

## 2022-09-20 DIAGNOSIS — Z111 Encounter for screening for respiratory tuberculosis: Secondary | ICD-10-CM

## 2022-09-20 DIAGNOSIS — R002 Palpitations: Secondary | ICD-10-CM | POA: Diagnosis not present

## 2022-09-20 NOTE — Patient Instructions (Addendum)
Drink plenty of fluid. You may use zofran as needed for nausea. You may use immodium as needed for diarrhea. Call if symptoms worsen or if symptoms do not improve in the next few days.

## 2022-09-20 NOTE — Progress Notes (Unsigned)
Subjective:     Patient ID: [name], female    DOB: [birth year], 42 y.o.   MRN: [id number]  Chief Complaint  Patient presents with   Covid Positive    Tested positive 1/22 /24. Still having fatigue.    Diarrhea    Complains of diarrhea and feeling nauceous    HPI Patient is in today with multiple symptoms.   Reports that she first developed HA last Saturday the 20th.  Tested positive on Monday 22nd.  She developed nasal congestion, brain fog. Monday to Thursday she had a fever, chills body aches and cough. This past Saturday she thought that she is feeling better but then she developed nausea, diarrhea and vomiting.  She also has had some heart palpitations "the whole time."    BP Readings from Last 3 Encounters:  09/20/22 109/78  07/05/22 121/80  06/22/22 117/81   Requesting titers for her new job credentialing.   Health Maintenance Due  Topic Date Due   COVID-19 Vaccine (3 - Pfizer risk series) 10/22/2019    Past Medical History:  Diagnosis Date   Abnormal Pap smear of cervix    2008 or 2009   Anxiety    Dental bridge present    lower front   Dry eyes    Endometriosis    Headache    Migraines   History of indigestion    treat with OTC   Hypoglycemia    Irritable bowel syndrome (IBS)    Seasonal allergies    Trigger thumb of right hand 11/2013    Past Surgical History:  Procedure Laterality Date   CHOLECYSTECTOMY  06/2015   CHROMOPERTUBATION N/A 05/30/2016   Procedure: CHROMOPERTUBATION;  Surgeon: Megan Salon, MD;  Location: [facility];  Service: Gynecology;  Laterality: N/A;   COLPOSCOPY  2008   CYSTOSCOPY N/A 08/21/2017   Procedure: CYSTOSCOPY;  Surgeon: Megan Salon, MD;  Location: [facility];  Service: Gynecology;  Laterality: N/A;   LABIOPLASTY N/A 08/21/2017   Procedure: LABIAPLASTY;  Surgeon: Megan Salon, MD;  Location: [facility];  Service: Gynecology;  Laterality: N/A;   LAPAROSCOPY N/A 05/30/2016   Procedure: LAPAROSCOPY OPERATIVE WITH LYSIS OF  ADHESIONS, EXCISION OF POSSIBLE ENDOMETRIOSIS;  Surgeon: Megan Salon, MD;  Location: [facility];  Service: Gynecology;  Laterality: N/A;   REFRACTIVE SURGERY Bilateral 02/2015   TONSILLECTOMY  1988   TOTAL LAPAROSCOPIC HYSTERECTOMY WITH SALPINGECTOMY Bilateral 08/21/2017   Procedure: TOTAL LAPAROSCOPIC HYSTERECTOMY WITH SALPINGECTOMY;  Surgeon: Megan Salon, MD;  Location: [facility];  Service: Gynecology;  Laterality: Bilateral;   TRIGGER FINGER RELEASE Right 12/23/2013   Procedure: RIGHT THUMB TRIGGER RELEASE ;  Surgeon: Tennis Must, MD;  Location: [facility];  Service: Orthopedics;  Laterality: Right;    Family History  Problem Relation Age of Onset   Breast cancer Maternal Grandmother 12       died early 79's   Hyperlipidemia Mother    Heart disease Father        valve replacement, aortic aneurysm   Cancer Paternal Grandfather        Jaw cancer, smoker    Social History   Socioeconomic History   Marital status: Married    Spouse name: Not on file   Number of children: Not on file   Years of education: Not on file   Highest education level: Not on file  Occupational History   Occupation: Surgical tech  Tobacco Use   Smoking status: Former  Types: Cigarettes    Quit date: 08/22/2010    Years since quitting: 12.0   Smokeless tobacco: Never  Vaping Use   Vaping Use: Never used  Substance and Sexual Activity   Alcohol use: Yes    Alcohol/week: 2.0 standard drinks of alcohol    Types: 2 Standard drinks or equivalent per week    Comment: per week   Drug use: No   Sexual activity: Yes    Partners: Male    Birth control/protection: Surgical    Comment: [facility]  Other Topics Concern   Not on file  Social History Narrative      Married   No children   American Bully    Enjoys hiking with dog, exercise for stress relief   Two story home   Caffeine 1-1/2 day   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   Social Determinants of Health    Financial Resource Strain: Low Risk  (05/31/2019)   Overall Financial Resource Strain (CARDIA)    Difficulty of Paying Living Expenses: Not hard at all  Food Insecurity: No Food Insecurity (05/31/2019)   Hunger Vital Sign    Worried About Running Out of Food in the Last Year: Never true    Ran Out of Food in the Last Year: Never true  Transportation Needs: No Transportation Needs (05/31/2019)   PRAPARE - Hydrologist (Medical): No    Lack of Transportation (Non-Medical): No  Physical Activity: Sufficiently Active (05/31/2019)   Exercise Vital Sign    Days of Exercise per Week: 4 days    Minutes of Exercise per Session: 60 min  Stress: Not on file  Social Connections: Moderately Isolated (05/31/2019)   Social Connection and Isolation Panel [NHANES]    Frequency of Communication with Friends and Family: More than three times a week    Frequency of Social Gatherings with Friends and Family: Once a week    Attends Religious Services: Never    Marine scientist or Organizations: No    Attends Archivist Meetings: Never    Marital Status: Married  Human resources officer Violence: Not At Risk (05/31/2019)   Humiliation, Afraid, Rape, and Kick questionnaire    Fear of Current or Ex-Partner: No    Emotionally Abused: No    Physically Abused: No    Sexually Abused: No    Outpatient Medications Prior to Visit  Medication Sig Dispense Refill   Carboxymethylcellulose Sod PF 0.5 % SOLN Place 1 drop into both eyes 2 (two) times daily.     Cholecalciferol (VITAMIN D3) 2000 units TABS Take 2,000 Units by mouth at bedtime.     clobetasol ointment (TEMOVATE) 2.44 % Apply 1 application. topically 2 (two) times daily. Apply as directed twice daily.  Stop after two weeks. 60 g 0   eletriptan (RELPAX) 40 MG tablet Take 1 tablet (40 mg total) by mouth one time as needed for headaches. 30 tablet 1   ibuprofen (ADVIL) 800 MG tablet Take 1 tablet (800 mg total) by mouth  every 8 (eight) hours as needed. 30 tablet 1   KRILL OIL PO Take by mouth.     MELATONIN ER PO Take by mouth.     methocarbamol (ROBAXIN) 500 MG tablet Take 1 tablet (500 mg total) by mouth every 6 (six) hours as needed for muscle spasms. 30 tablet 5   mometasone (ELOCON) 0.1 % ointment Apply topically twice weekly 45 g 3  montelukast (SINGULAIR) 10 MG tablet Take 1 tablet (10 mg total) by mouth at bedtime. 90 tablet 3   Multiple Vitamin (MULTIVITAMIN WITH MINERALS) TABS tablet Take 1 tablet by mouth at bedtime.     NONFORMULARY OR COMPOUNDED ITEM Testosterone '1mg'$ /0.80m topical cream.  Apply 0.158mthree times weekly to thighs. Disp: 90 day supply. 1 each 1   ondansetron (ZOFRAN ODT) 4 MG disintegrating tablet Take 1 tablet (4 mg total) by mouth every 8 (eight) hours as needed for nausea or vomiting. 20 tablet 5   Simethicone 125 MG CAPS Take 125 mg by mouth at bedtime.     valACYclovir (VALTREX) 1000 MG tablet Take 1 tablet (1,000 mg total) by mouth daily. 90 tablet 4   No facility-administered medications prior to visit.    Allergies  Allergen Reactions   Other     Dermabond.  Topical blisters.    ROS    See HPI Objective:    Physical Exam Constitutional:      General: She is not in acute distress.    Appearance: Normal appearance. She is well-developed.     Comments: Tired appearing  HENT:     Head: Normocephalic and atraumatic.     Right Ear: Tympanic membrane, ear canal and external ear normal.     Left Ear: Tympanic membrane, ear canal and external ear normal.  Eyes:     General: No scleral icterus. Neck:     Thyroid: No thyromegaly.  Cardiovascular:     Rate and Rhythm: Normal rate and regular rhythm.     Heart sounds: Normal heart sounds. No murmur heard. Pulmonary:     Effort: Pulmonary effort is normal. No respiratory distress.     Breath sounds: Normal breath sounds. No wheezing.  Musculoskeletal:     Cervical back: Neck supple.  Lymphadenopathy:      Cervical: No cervical adenopathy.  Skin:    General: Skin is warm and dry.  Neurological:     Mental Status: She is alert and oriented to person, place, and time.  Psychiatric:        Mood and Affect: Mood normal.        Behavior: Behavior normal.        Thought Content: Thought content normal.        Judgment: Judgment normal.     BP 109/78 (BP Location: Right Arm, Patient Position: Sitting, Cuff Size: Small)   Pulse 79   Temp 98.1 F (36.7 C) (Oral)   Resp 16   Wt 112 lb (50.8 kg)   LMP 07/31/2017   SpO2 100%   BMI 21.16 kg/m  Wt Readings from Last 3 Encounters:  09/20/22 112 lb (50.8 kg)  07/05/22 112 lb (50.8 kg)  06/22/22 112 lb 12.8 oz (51.2 kg)       Assessment & Plan:   Problem List Items Addressed This Visit       Unprioritized   Palpitations - Primary    EKG tracing is personally reviewed.  EKG notes NSR.  No acute changes.   Electrolytes are normal.  Will monitor for now. If symptoms persist, consider cardiology evaluation.       Relevant Orders   EKG 12-Lead (Completed)   Comp Met (CMET) (Completed)   CBC w/Diff (Completed)   COVID-19 virus infection    I suspect that she may have developed an acute gastroenteritis on top of her resolving covid infection given the timing of her GI symptom presentation.  Discussed importance of hydration, rest.  OK to use imodium prn diarrhea and zofran prn nausea.  Pt understands to go to the ER if symptoms worsen or if she is unable to maintain her hydration.       Other Visit Diagnoses     Immunity status testing       Relevant Orders   Varicella-Zoster Virus Antibody (Immunity Screen), ACIF ,Serum   Measles/Mumps/Rubella Immunity   Hepatitis B surface antibody,quantitative (Completed)   Tuberculosis screening       Relevant Orders   QuantiFERON-TB Gold Plus       I am having [name] maintain her Simethicone, Vitamin D3, multivitamin with minerals, Carboxymethylcellulose Sod PF, MELATONIN ER PO,  ondansetron, methocarbamol, clobetasol ointment, valACYclovir, montelukast, NONFORMULARY OR COMPOUNDED ITEM, KRILL OIL PO, eletriptan, mometasone, and ibuprofen.  No orders of the defined types were placed in this encounter.

## 2022-09-21 DIAGNOSIS — U071 COVID-19: Secondary | ICD-10-CM | POA: Insufficient documentation

## 2022-09-21 DIAGNOSIS — R002 Palpitations: Secondary | ICD-10-CM | POA: Insufficient documentation

## 2022-09-21 LAB — CBC WITH DIFFERENTIAL/PLATELET
Basophils Absolute: 0 10*3/uL (ref 0.0–0.1)
Basophils Relative: 0.7 % (ref 0.0–3.0)
Eosinophils Absolute: 0.1 10*3/uL (ref 0.0–0.7)
Eosinophils Relative: 1 % (ref 0.0–5.0)
HCT: 38.7 % (ref 36.0–46.0)
Hemoglobin: 13.5 g/dL (ref 12.0–15.0)
Lymphocytes Relative: 31.2 % (ref 12.0–46.0)
Lymphs Abs: 1.8 10*3/uL (ref 0.7–4.0)
MCHC: 34.7 g/dL (ref 30.0–36.0)
MCV: 89.4 fl (ref 78.0–100.0)
Monocytes Absolute: 0.5 10*3/uL (ref 0.1–1.0)
Monocytes Relative: 9.1 % (ref 3.0–12.0)
Neutro Abs: 3.4 10*3/uL (ref 1.4–7.7)
Neutrophils Relative %: 58 % (ref 43.0–77.0)
Platelets: 250 10*3/uL (ref 150.0–400.0)
RBC: 4.33 Mil/uL (ref 3.87–5.11)
RDW: 12.1 % (ref 11.5–15.5)
WBC: 5.9 10*3/uL (ref 4.0–10.5)

## 2022-09-21 LAB — COMPREHENSIVE METABOLIC PANEL
ALT: 43 U/L — ABNORMAL HIGH (ref 0–35)
AST: 20 U/L (ref 0–37)
Albumin: 4.8 g/dL (ref 3.5–5.2)
Alkaline Phosphatase: 60 U/L (ref 39–117)
BUN: 7 mg/dL (ref 6–23)
CO2: 26 mEq/L (ref 19–32)
Calcium: 9.5 mg/dL (ref 8.4–10.5)
Chloride: 102 mEq/L (ref 96–112)
Creatinine, Ser: 0.57 mg/dL (ref 0.40–1.20)
GFR: 112.45 mL/min (ref >60.00)
Glucose, Bld: 111 mg/dL — ABNORMAL HIGH (ref 70–99)
Potassium: 3.9 mEq/L (ref 3.5–5.1)
Sodium: 138 mEq/L (ref 135–145)
Total Bilirubin: 0.7 mg/dL (ref 0.2–1.2)
Total Protein: 7.4 g/dL (ref 6.0–8.3)

## 2022-09-21 NOTE — Assessment & Plan Note (Signed)
I suspect that she may have developed an acute gastroenteritis on top of her resolving covid infection given the timing of her GI symptom presentation.  Discussed importance of hydration, rest.  OK to use imodium prn diarrhea and zofran prn nausea.  Pt understands to go to the ER if symptoms worsen or if she is unable to maintain her hydration.

## 2022-09-21 NOTE — Assessment & Plan Note (Signed)
EKG tracing is personally reviewed.  EKG notes NSR.  No acute changes.   Electrolytes are normal.  Will monitor for now. If symptoms persist, consider cardiology evaluation.

## 2022-09-22 LAB — QUANTIFERON-TB GOLD PLUS
Mitogen-NIL: >10 IU/mL
NIL: 0.02 IU/mL
QuantiFERON-TB Gold Plus: NEGATIVE
TB1-NIL: <0 IU/mL
TB2-NIL: <0 IU/mL

## 2022-09-24 LAB — MEASLES/MUMPS/RUBELLA IMMUNITY
Mumps IgG: >300 AU/mL
Rubella: 3.66 Index
Rubeola IgG: >300 AU/mL

## 2022-09-24 LAB — VARICELLA-ZOSTER VIRUS AB(IMMUNITY SCREEN),ACIF,SERUM: VARICELLA ZOSTER VIRUS AB (IMMUNITY SCR),ACIF SERUM: >=1:4 {titer}

## 2022-09-24 LAB — HEPATITIS B SURFACE ANTIBODY, QUANTITATIVE: Hep B S AB Quant (Post): >1000 m[IU]/mL (ref >=10)

## 2022-09-26 ENCOUNTER — Ambulatory Visit: Payer: BC Managed Care – PPO | Attending: Obstetrics & Gynecology | Admitting: Physical Therapy

## 2022-09-26 ENCOUNTER — Encounter: Payer: Self-pay | Admitting: Physical Therapy

## 2022-09-26 DIAGNOSIS — R252 Cramp and spasm: Secondary | ICD-10-CM | POA: Diagnosis present

## 2022-09-26 DIAGNOSIS — R279 Unspecified lack of coordination: Secondary | ICD-10-CM | POA: Diagnosis present

## 2022-09-26 DIAGNOSIS — M6281 Muscle weakness (generalized): Secondary | ICD-10-CM | POA: Insufficient documentation

## 2022-09-26 NOTE — Therapy (Addendum)
OUTPATIENT PHYSICAL THERAPY TREATMENT NOTE   Patient Name: [name] MRN: [id number] DOB:[birth year], 42 y.o., female Today's Date: 09/26/2022  PCP: Debbrah Alar, NP   REFERRING PROVIDER: Megan Salon, MD    END OF SESSION:   PT End of Session - 09/26/22 1228     Visit Number 5    Date for PT Re-Evaluation 10/24/22    Authorization Type [employer]    PT Start Time 1230    PT Stop Time 1310    PT Time Calculation (min) 40 min    Activity Tolerance Patient tolerated treatment well    Behavior During Therapy [facility] for tasks assessed/performed             Past Medical History:  Diagnosis Date   Abnormal Pap smear of cervix    2008 or 2009   Anxiety    Dental bridge present    lower front   Dry eyes    Endometriosis    Headache    Migraines   History of indigestion    treat with OTC   Hypoglycemia    Irritable bowel syndrome (IBS)    Seasonal allergies    Trigger thumb of right hand 11/2013   Past Surgical History:  Procedure Laterality Date   CHOLECYSTECTOMY  06/2015   CHROMOPERTUBATION N/A 05/30/2016   Procedure: CHROMOPERTUBATION;  Surgeon: Megan Salon, MD;  Location: [facility];  Service: Gynecology;  Laterality: N/A;   COLPOSCOPY  2008   CYSTOSCOPY N/A 08/21/2017   Procedure: CYSTOSCOPY;  Surgeon: Megan Salon, MD;  Location: [facility];  Service: Gynecology;  Laterality: N/A;   LABIOPLASTY N/A 08/21/2017   Procedure: LABIAPLASTY;  Surgeon: Megan Salon, MD;  Location: [facility];  Service: Gynecology;  Laterality: N/A;   LAPAROSCOPY N/A 05/30/2016   Procedure: LAPAROSCOPY OPERATIVE WITH LYSIS OF ADHESIONS, EXCISION OF POSSIBLE ENDOMETRIOSIS;  Surgeon: Megan Salon, MD;  Location: [facility];  Service: Gynecology;  Laterality: N/A;   REFRACTIVE SURGERY Bilateral 02/2015   TONSILLECTOMY  1988   TOTAL LAPAROSCOPIC HYSTERECTOMY WITH SALPINGECTOMY Bilateral 08/21/2017   Procedure: TOTAL LAPAROSCOPIC HYSTERECTOMY WITH SALPINGECTOMY;  Surgeon: Megan Salon, MD;   Location: [facility];  Service: Gynecology;  Laterality: Bilateral;   TRIGGER FINGER RELEASE Right 12/23/2013   Procedure: RIGHT THUMB TRIGGER RELEASE ;  Surgeon: Tennis Must, MD;  Location: [facility];  Service: Orthopedics;  Laterality: Right;   Patient Active Problem List   Diagnosis Date Noted   COVID-19 virus infection 09/21/2022   Palpitations 09/21/2022   Seasonal allergies 04/21/2022   Atypical chest pain 04/21/2022   Preventative health care 04/16/2021   Migraine with aura and without status migrainosus, not intractable 12/01/2020   Urinary urgency 12/01/2020   Increased risk of breast cancer 12/01/2020   H/O: hysterectomy 11/27/2019   Hyperlipidemia    Endometriosis 05/01/2017   Spasm of bowel 01/29/2016   IBS (irritable bowel syndrome) 01/29/2016   REFERRING DIAG: M62.89 (ICD-10-CM) - Pelvic floor dysfunction    THERAPY DIAG:  .R27.9 Unspecified lack of coordination; M62.81 Muscle weakness( generalized) R25.2 Cramp and spasm Earlie Counts, PT 10/14/22 9:32 AM    Rationale for Evaluation and Treatment: Rehabilitation   ONSET DATE: 2018   SUBJECTIVE:  SUBJECTIVE STATEMENT: I had COVID 2 weeks ago. I was not able to do the HEP as much. I have had the pain flare-up every so often. The flare-ups do not last as long.  I felt very good after last visit. I still have right sided pain.   PAIN:  Are you having pain? Yes NPRS scale: 3/10 Pain location: Right lower quadrant   Pain type: aching Pain description: intermittent    Aggravating factors: touching the area Relieving factors: ibuprofen, heating pad   PAIN:  Are you having pain? Yes: NPRS scale: 0/10 Pain location: bladder Pain description: full bladder, pressure Aggravating factors: full bladder, alcohol   Relieving factors: urinate     PRECAUTIONS: None   WEIGHT BEARING RESTRICTIONS: No   FALLS:  Has patient fallen in last 6 months? No   LIVING ENVIRONMENT: Lives with: lives with their family   OCCUPATION: surgical tech in operating room , workouts include yoga, weights, and cardio   PLOF: Independent   PATIENT GOALS: work on right lower quadrant pain   PERTINENT HISTORY:  Endometriosis; IBS, labioplasty, cholecystectomy, laparoscopy with lysis of adhesions and possible endometriosis; Hysterectomy Sexual abuse: No   BOWEL MOVEMENT: Pain with bowel movement: No   URINATION: Pain with urination: Yes sometimes Fully empty bladder: Yes: sometimes has to go a second time to fully empty bladder Stream: Weak compared to before her hysterectomy Urgency: Yes:   Frequency: sometimes every hour,  Leakage: Coughing and Sneezing Pads: No   INTERCOURSE: Pain with intercourse: During Penetration, deep toward the right side Ability to have vaginal penetration:  Yes:   Climax: struggle Marinoff Scale: 2/3     PROLAPSE: None     OBJECTIVE:    DIAGNOSTIC FINDINGS:  Endo confirmed     COGNITION: Overall cognitive status: Within functional limits for tasks assessed                          SENSATION: Light touch: Appears intact Proprioception: Appears intact       POSTURE: decreased lumbar lordosis   PELVIC ALIGNMENT: bilateral ASIS are equal   LUMBARAROM/PROM:   A/PROM A/PROM  eval  Extension Decreased by 25%  Left rotation Decreased by 25%   (Blank rows = not tested)   LOWER EXTREMITY ROM: full bilateral hip ROM     LOWER EXTREMITY MMT:   MMT Right eval Left eval  Hip extension 5/5 4/5  Hip abduction 4/5 3+/5    PALPATION:   General  right abdominal has trigger points, increased tenderness located in the right lower abdomen, contraction of abdominals and bulges the lower abdomen, tenderness at the quadratus by the ribs left worse than right.                   External Perineal Exam intact and good coloring                             Internal Pelvic Floor tenderness located in the right obturator internist, along the right side of the urethra and bladder.    Patient confirms identification and approves PT to assess internal pelvic floor and treatment Yes   PELVIC MMT:   MMT eval 09/05/22  Vaginal 4/5 posterior and 3/5 anterior 4/5 with good hug of therapist finger        TONE: increased   PROLAPSE: none   TODAY'S TREATMENT:   09/26/22 Manual: Myofascial release:  Fascial release of the right lower quadrant going through the restrictions  Internal pelvic floor techniques: No emotional/communication barriers or cognitive limitation. Patient is motivated to learn. Patient understands and agrees with treatment goals and plan. PT explains patient will be examined in standing, sitting, and lying down to see how their muscles and joints work. When they are ready, they will be asked to remove their underwear so PT can examine their perineum. The patient is also given the option of providing their own chaperone as one is not provided in our facility. The patient also has the right and is explained the right to defer or refuse any part of the evaluation or treatment including the internal exam. With the patient's consent, PT will use one gloved finger to gently assess the muscles of the pelvic floor, seeing how well it contracts and relaxes and if there is muscle symmetry. After, the patient will get dressed and PT and patient will discuss exam findings and plan of care. PT and patient discuss plan of care, schedule, attendance policy and HEP activities.  Going through the vaginal working on the right levator ani, right obturator internist while monitoring for pain and tissue relaxation Educated patient on how to use the vaginal wand and hold for 90 seconds with trigger point, pressure is not to be more than the pressure you put on a  tomato  09/05/22 Manual: Soft tissue mobilization: To assess for dry needling to the hip adductors and right side of the rectus Manual work to the rectus on the right and bilateral hip adductors to elongate after dry needling Internal pelvic floor techniques: No emotional/communication barriers or cognitive limitation. Patient is motivated to learn. Patient understands and agrees with treatment goals and plan. PT explains patient will be examined in standing, sitting, and lying down to see how their muscles and joints work. When they are ready, they will be asked to remove their underwear so PT can examine their perineum. The patient is also given the option of providing their own chaperone as one is not provided in our facility. The patient also has the right and is explained the right to defer or refuse any part of the evaluation or treatment including the internal exam. With the patient's consent, PT will use one gloved finger to gently assess the muscles of the pelvic floor, seeing how well it contracts and relaxes and if there is muscle symmetry. After, the patient will get dressed and PT and patient will discuss exam findings and plan of care. PT and patient discuss plan of care, schedule, attendance policy and HEP activities.  Going through the vaginal canal working on the obturator internist, right side of the puborectalis, perineal body, releasing along the ureter, superior bladder area and along the ovaries.  Trigger Point Dry-Needling  Treatment instructions: Expect mild to moderate muscle soreness. S/S of pneumothorax if dry needled over a lung field, and to seek immediate medical attention should they occur. Patient verbalized understanding of these instructions and education.   Patient Consent Given: Yes Education handout provided: Previously provided Muscles treated: bilateral hip adductors, right side of umbilicus Electrical stimulation performed: No Parameters: N/A Treatment  response/outcome: elongation of muscle and trigger point response  Exercises: Stretches/mobility: ITB with foam roll bil Hip flexor with foam roll Strengthening: Supine marching with red band and holding red band at shoulder height    09/02/2022 Manual: Soft tissue mobilization: To assess for dry needling Manual work to the right lumbar paraspinals, quadratus, gluteus medius, and  rectus abdominus to elongate after dry needling Scar tissue mobilization: Manual release of the umbilical scar while needle in the scar Internal pelvic floor techniques: No emotional/communication barriers or cognitive limitation. Patient is motivated to learn. Patient understands and agrees with treatment goals and plan. PT explains patient will be examined in standing, sitting, and lying down to see how their muscles and joints work. When they are ready, they will be asked to remove their underwear so PT can examine their perineum. The patient is also given the option of providing their own chaperone as one is not provided in our facility. The patient also has the right and is explained the right to defer or refuse any part of the evaluation or treatment including the internal exam. With the patient's consent, PT will use one gloved finger to gently assess the muscles of the pelvic floor, seeing how well it contracts and relaxes and if there is muscle symmetry. After, the patient will get dressed and PT and patient will discuss exam findings and plan of care. PT and patient discuss plan of care, schedule, attendance policy and HEP activities.  Release of the urogenital diaphragm externally going through the different levels of fascial restrictions Trigger Point Dry-Needling  Treatment instructions: Expect mild to moderate muscle soreness. S/S of pneumothorax if dry needled over a lung field, and to seek immediate medical attention should they occur. Patient verbalized understanding of these instructions and education.    Patient Consent Given: Yes Education handout provided: Previously provided Muscles treated: right lumbar multifidi, right quadratus, right gluteus medius, right mid rectus, along umbilical scar Electrical stimulation performed: No Parameters: N/A Treatment response/outcome: elongation of muscle and trigger point response   Exercises: Stretches/mobility: Foam roll on ITB bilaterally Foam roll on the hip flexors Foam roll on the piriformis  Strengthening: Supine marching with lower abdominal contraction                                                                                                                                  PATIENT EDUCATION:  09/02/2022 Education details: Access Code: [email or web address], massage of the lower abdomen Person educated: Patient Education method: Explanation, Demonstration, Tactile cues, Verbal cues, and Handouts Education comprehension: verbalized understanding, returned demonstration, verbal cues required, tactile cues required, and needs further education   HOME EXERCISE PROGRAM: 09/02/2022 Access Code: [email or web address] URL: [email or web address] Date: 09/02/2022 Prepared by: Earlie Counts   Program Notes massaging the lower abdominals   Exercises - - Supine March  - 1 x daily - 3 x weekly - 2 sets - 10 reps       ASSESSMENT:   CLINICAL IMPRESSION: Patient is a 42 y.o. female who was seen today for physical therapy  treatment for pelvic floor dysfunction. Right lower quadrant pain decreased by 50%.  Patient has not had the bladder pressure like she used to. Urinary leakage improved by 50%. Pelvic floor strength is 4/5 with good strong hug of  therapist finger. She had trigger points in the right pelvic floor muscles. Patient pelvic floor muscles felt softer after manual work and she had less pain. Patient will benefit from skilled therapy to improve pelvic floor coordination and reduce trigger points to reduce pain.    OBJECTIVE IMPAIRMENTS:  decreased activity tolerance, decreased coordination, decreased endurance, decreased strength, increased fascial restrictions, increased muscle spasms, impaired tone, and pain.    ACTIVITY LIMITATIONS: carrying, lifting, bending, sitting, standing, sleeping, transfers, continence, and toileting   PARTICIPATION LIMITATIONS: meal prep, cleaning, laundry, interpersonal relationship, shopping, community activity, and occupation   PERSONAL FACTORS: Past/current experiences and 1-2 comorbidities: Endometriosis; IBS, labioplasty, cholecystectomy, laparoscopy with lysis of adhesions and possible endometriosis; Hysterectomy  are also affecting patient's functional outcome.    REHAB POTENTIAL: Excellent   CLINICAL DECISION MAKING: Stable/uncomplicated   EVALUATION COMPLEXITY: Low     GOALS: Goals reviewed with patient? Yes   SHORT TERM GOALS: Target date: 08/29/2022   Patient is independent with initial HEP for hip stretches and bulging of the pelvic floor.  Baseline: Goal status: Met 09/05/22   2.  Patient is able to equally contract the upper and lower abdomen.  Baseline:  Goal status: Met 09/05/22   3.  Patient reports her pain is 25% decreased due to less trigger points in the abdomen and pelvic floor.  Baseline:  Goal status: Met 09/26/22   4.  Patient is able to bulge her pelvic floor to relax.  Baseline:  Goal status: Met 09/05/2022     LONG TERM GOALS: Target date: 10/23/3032   Patient independent with advanced HEP for core and hips to reduce her pain.  Baseline:  Goal status: ongoing 09/26/22   2.  Patient reports her right lower quadrant pain decreased </= 0-1/10 due to improved muscle lengthening and less trigger points.  Baseline:  Goal status: ongoing 09/26/22   3.  Patient is able to exercise correctly in the gym with reduction of bulging of the lower abdomen and less strain on the pelvic floor.  Baseline:  Goal status: ongoing 09/26/22   4.  Patient able to have penile  penetration with 0-1 pain level due to decreased trigger points in the pelvic floor.  Baseline:  Goal status: ongoing 09/26/22   5.  Patient able to sneeze and cough without urinary leakage due to improve pelvic floor coordination.  Baseline:  Goal status: ongoing 09/26/22   PLAN:   PT FREQUENCY: 1x/week   PT DURATION: 12 weeks   PLANNED INTERVENTIONS: Therapeutic exercises, Therapeutic activity, Neuromuscular re-education, Patient/Family education, Joint mobilization, Dry Needling, Electrical stimulation, Cryotherapy, Moist heat, Taping, Biofeedback, and Manual therapy   PLAN FOR NEXT SESSION:  supine bird dog and dead bug; internal work along the bladder and ovaries, work on left side of pelvic floor.   Earlie Counts, PT 09/26/22 1:15 PM

## 2022-09-27 ENCOUNTER — Telehealth: Payer: Self-pay | Admitting: Family

## 2022-09-27 NOTE — Telephone Encounter (Signed)
Patient brought in form for MO to fill out Placed in MO bin up front Patient would like to be called when its ready for pick up [phone]

## 2022-09-27 NOTE — Telephone Encounter (Signed)
Accident sent to robin   This is for [place]

## 2022-09-28 NOTE — Telephone Encounter (Signed)
Form put in to provider's folder

## 2022-10-14 ENCOUNTER — Encounter: Payer: Self-pay | Admitting: Physical Therapy

## 2022-10-14 ENCOUNTER — Ambulatory Visit: Payer: BC Managed Care – PPO | Admitting: Physical Therapy

## 2022-10-14 DIAGNOSIS — R279 Unspecified lack of coordination: Secondary | ICD-10-CM | POA: Diagnosis not present

## 2022-10-14 DIAGNOSIS — M6281 Muscle weakness (generalized): Secondary | ICD-10-CM

## 2022-10-14 DIAGNOSIS — R252 Cramp and spasm: Secondary | ICD-10-CM

## 2022-10-14 NOTE — Therapy (Signed)
OUTPATIENT PHYSICAL THERAPY TREATMENT NOTE   Patient Name: [name] MRN: [id number] DOB:August 09, 1981, 42 y.o., female Today's Date: 10/14/2022  PCP: Debbrah Alar, NP  REFERRING PROVIDER: Megan Salon, MD   END OF SESSION:   PT End of Session - 10/14/22 0849     Visit Number 6    Date for PT Re-Evaluation 10/24/22    Authorization Type [employer]    PT Start Time 0845    PT Stop Time 0925    PT Time Calculation (min) 40 min    Activity Tolerance Patient tolerated treatment well    Behavior During Therapy [facility] for tasks assessed/performed             Past Medical History:  Diagnosis Date   Abnormal Pap smear of cervix    2008 or 2009   Anxiety    Dental bridge present    lower front   Dry eyes    Endometriosis    Headache    Migraines   History of indigestion    treat with OTC   Hypoglycemia    Irritable bowel syndrome (IBS)    Seasonal allergies    Trigger thumb of right hand 11/2013   Past Surgical History:  Procedure Laterality Date   CHOLECYSTECTOMY  06/2015   CHROMOPERTUBATION N/A 05/30/2016   Procedure: CHROMOPERTUBATION;  Surgeon: Megan Salon, MD;  Location: [facility];  Service: Gynecology;  Laterality: N/A;   COLPOSCOPY  2008   CYSTOSCOPY N/A 08/21/2017   Procedure: CYSTOSCOPY;  Surgeon: Megan Salon, MD;  Location: [facility];  Service: Gynecology;  Laterality: N/A;   LABIOPLASTY N/A 08/21/2017   Procedure: LABIAPLASTY;  Surgeon: Megan Salon, MD;  Location: [facility];  Service: Gynecology;  Laterality: N/A;   LAPAROSCOPY N/A 05/30/2016   Procedure: LAPAROSCOPY OPERATIVE WITH LYSIS OF ADHESIONS, EXCISION OF POSSIBLE ENDOMETRIOSIS;  Surgeon: Megan Salon, MD;  Location: [facility];  Service: Gynecology;  Laterality: N/A;   REFRACTIVE SURGERY Bilateral 02/2015   TONSILLECTOMY  1988   TOTAL LAPAROSCOPIC HYSTERECTOMY WITH SALPINGECTOMY Bilateral 08/21/2017   Procedure: TOTAL LAPAROSCOPIC HYSTERECTOMY WITH SALPINGECTOMY;  Surgeon: Megan Salon, MD;   Location: [facility];  Service: Gynecology;  Laterality: Bilateral;   TRIGGER FINGER RELEASE Right 12/23/2013   Procedure: RIGHT THUMB TRIGGER RELEASE ;  Surgeon: Tennis Must, MD;  Location: [facility];  Service: Orthopedics;  Laterality: Right;   Patient Active Problem List   Diagnosis Date Noted   COVID-19 virus infection 09/21/2022   Palpitations 09/21/2022   Seasonal allergies 04/21/2022   Atypical chest pain 04/21/2022   Preventative health care 04/16/2021   Migraine with aura and without status migrainosus, not intractable 12/01/2020   Urinary urgency 12/01/2020   Increased risk of breast cancer 12/01/2020   H/O: hysterectomy 11/27/2019   Hyperlipidemia    Endometriosis 05/01/2017   Spasm of bowel 01/29/2016   IBS (irritable bowel syndrome) 01/29/2016   REFERRING DIAG: M62.89 (ICD-10-CM) - Pelvic floor dysfunction    THERAPY DIAG:  R27.9 Unspecified lack of coordination; M62.81 Muscle weakness( generalized) R25.2 Cramp and spasm   Rationale for Evaluation and Treatment: Rehabilitation   ONSET DATE: 2018   SUBJECTIVE:  SUBJECTIVE STATEMENT: I have been been a little flared up and could be due to be stressed. It happens more at night. I have used the wand several times.   I felt very good after last visit. I still have right sided pain.    PAIN:  Are you having pain? Yes NPRS scale: 3/10 Pain location: Right lower quadrant   Pain type: aching Pain description: intermittent    Aggravating factors: touching the area Relieving factors: ibuprofen, heating pad   PAIN:  Are you having pain? Yes: NPRS scale: 0/10 Pain location: bladder Pain description: full bladder, pressure Aggravating factors: full bladder, alcohol  Relieving factors: urinate     PRECAUTIONS: None    WEIGHT BEARING RESTRICTIONS: No   FALLS:  Has patient fallen in last 6 months? No   LIVING ENVIRONMENT: Lives with: lives with their family   OCCUPATION: surgical tech in operating room , workouts include yoga, weights, and cardio   PLOF: Independent   PATIENT GOALS: work on right lower quadrant pain   PERTINENT HISTORY:  Endometriosis; IBS, labioplasty, cholecystectomy, laparoscopy with lysis of adhesions and possible endometriosis; Hysterectomy Sexual abuse: No   BOWEL MOVEMENT: Pain with bowel movement: No   URINATION: Pain with urination: Yes sometimes Fully empty bladder: Yes: sometimes has to go a second time to fully empty bladder Stream: Weak compared to before her hysterectomy Urgency: Yes:   Frequency: sometimes every hour,  Leakage: Coughing and Sneezing Pads: No   INTERCOURSE: Pain with intercourse: During Penetration, deep toward the right side Ability to have vaginal penetration:  Yes:   Climax: struggle Marinoff Scale: 2/3     PROLAPSE: None     OBJECTIVE:    DIAGNOSTIC FINDINGS:  Endo confirmed     COGNITION: Overall cognitive status: Within functional limits for tasks assessed                          SENSATION: Light touch: Appears intact Proprioception: Appears intact       POSTURE: decreased lumbar lordosis   PELVIC ALIGNMENT: bilateral ASIS are equal   LUMBARAROM/PROM:   A/PROM A/PROM  eval  Extension Decreased by 25%  Left rotation Decreased by 25%   (Blank rows = not tested)   LOWER EXTREMITY ROM: full bilateral hip ROM     LOWER EXTREMITY MMT:   MMT Right eval Left eval  Hip extension 5/5 4/5  Hip abduction 4/5 3+/5    PALPATION:   General  right abdominal has trigger points, increased tenderness located in the right lower abdomen, contraction of abdominals and bulges the lower abdomen, tenderness at the quadratus by the ribs left worse than right.                  External Perineal Exam intact and good  coloring                             Internal Pelvic Floor tenderness located in the right obturator internist, along the right side of the urethra and bladder.    Patient confirms identification and approves PT to assess internal pelvic floor and treatment Yes   PELVIC MMT:   MMT eval 09/05/22 10/14/22  Vaginal 4/5 posterior and 3/5 anterior 4/5 with good hug of therapist finger 4/5        TONE: increased   PROLAPSE: none   TODAY'S TREATMENT:   10/14/22 Manual: Internal pelvic floor  techniques: No emotional/communication barriers or cognitive limitation. Patient is motivated to learn. Patient understands and agrees with treatment goals and plan. PT explains patient will be examined in standing, sitting, and lying down to see how their muscles and joints work. When they are ready, they will be asked to remove their underwear so PT can examine their perineum. The patient is also given the option of providing their own chaperone as one is not provided in our facility. The patient also has the right and is explained the right to defer or refuse any part of the evaluation or treatment including the internal exam. With the patient's consent, PT will use one gloved finger to gently assess the muscles of the pelvic floor, seeing how well it contracts and relaxes and if there is muscle symmetry. After, the patient will get dressed and PT and patient will discuss exam findings and plan of care. PT and patient discuss plan of care, schedule, attendance policy and HEP activities.  Going through the vaginal canal working on the anterior vaginal wall on the right with other hand on the right lower abdomen releasing along the tissue and ovary while monitoring for pain.     09/26/22 Manual: Myofascial release: Fascial release of the right lower quadrant going through the restrictions  Internal pelvic floor techniques: No emotional/communication barriers or cognitive limitation. Patient is motivated to  learn. Patient understands and agrees with treatment goals and plan. PT explains patient will be examined in standing, sitting, and lying down to see how their muscles and joints work. When they are ready, they will be asked to remove their underwear so PT can examine their perineum. The patient is also given the option of providing their own chaperone as one is not provided in our facility. The patient also has the right and is explained the right to defer or refuse any part of the evaluation or treatment including the internal exam. With the patient's consent, PT will use one gloved finger to gently assess the muscles of the pelvic floor, seeing how well it contracts and relaxes and if there is muscle symmetry. After, the patient will get dressed and PT and patient will discuss exam findings and plan of care. PT and patient discuss plan of care, schedule, attendance policy and HEP activities.  Going through the vaginal working on the right levator ani, right obturator internist while monitoring for pain and tissue relaxation Educated patient on how to use the vaginal wand and hold for 90 seconds with trigger point, pressure is not to be more than the pressure you put on a tomato   09/05/22 Manual: Soft tissue mobilization: To assess for dry needling to the hip adductors and right side of the rectus Manual work to the rectus on the right and bilateral hip adductors to elongate after dry needling Internal pelvic floor techniques: No emotional/communication barriers or cognitive limitation. Patient is motivated to learn. Patient understands and agrees with treatment goals and plan. PT explains patient will be examined in standing, sitting, and lying down to see how their muscles and joints work. When they are ready, they will be asked to remove their underwear so PT can examine their perineum. The patient is also given the option of providing their own chaperone as one is not provided in our facility. The  patient also has the right and is explained the right to defer or refuse any part of the evaluation or treatment including the internal exam. With the patient's consent, PT will use  one gloved finger to gently assess the muscles of the pelvic floor, seeing how well it contracts and relaxes and if there is muscle symmetry. After, the patient will get dressed and PT and patient will discuss exam findings and plan of care. PT and patient discuss plan of care, schedule, attendance policy and HEP activities.  Going through the vaginal canal working on the obturator internist, right side of the puborectalis, perineal body, releasing along the ureter, superior bladder area and along the ovaries.  Trigger Point Dry-Needling  Treatment instructions: Expect mild to moderate muscle soreness. S/S of pneumothorax if dry needled over a lung field, and to seek immediate medical attention should they occur. Patient verbalized understanding of these instructions and education.   Patient Consent Given: Yes Education handout provided: Previously provided Muscles treated: bilateral hip adductors, right side of umbilicus Electrical stimulation performed: No Parameters: N/A Treatment response/outcome: elongation of muscle and trigger point response  Exercises: Stretches/mobility: ITB with foam roll bil Hip flexor with foam roll Strengthening: Supine marching with red band and holding red band at shoulder height                                                                                                                                 PATIENT EDUCATION:  09/02/2022 Education details: Access Code: [email or web address], massage of the lower abdomen Person educated: Patient Education method: Explanation, Demonstration, Tactile cues, Verbal cues, and Handouts Education comprehension: verbalized understanding, returned demonstration, verbal cues required, tactile cues required, and needs further education   HOME EXERCISE  PROGRAM: 09/02/2022 Access Code: [email or web address] URL: [email or web address] Date: 09/02/2022 Prepared by: Earlie Counts   Program Notes massaging the lower abdominals   Exercises - - Supine March  - 1 x daily - 3 x weekly - 2 sets - 10 reps       ASSESSMENT:   CLINICAL IMPRESSION: Patient is a 42 y.o. female who was seen today for physical therapy  treatment for pelvic floor dysfunction. Patient is having a flare-up with her right lower quadrant pain that is waking her up at night. Patient had fascial restrictions in the right lower quadrant that released with manual work. Pelvic floor strength is 4/5 but difficulty with quick contractions so she may leak with a sneeze. Patient will benefit from skilled therapy to improve pelvic floor coordination and reduce trigger points to reduce pain.    OBJECTIVE IMPAIRMENTS: decreased activity tolerance, decreased coordination, decreased endurance, decreased strength, increased fascial restrictions, increased muscle spasms, impaired tone, and pain.    ACTIVITY LIMITATIONS: carrying, lifting, bending, sitting, standing, sleeping, transfers, continence, and toileting   PARTICIPATION LIMITATIONS: meal prep, cleaning, laundry, interpersonal relationship, shopping, community activity, and occupation   PERSONAL FACTORS: Past/current experiences and 1-2 comorbidities: Endometriosis; IBS, labioplasty, cholecystectomy, laparoscopy with lysis of adhesions and possible endometriosis; Hysterectomy  are also affecting patient's functional outcome.    REHAB POTENTIAL: Excellent   CLINICAL DECISION MAKING: Stable/uncomplicated   EVALUATION  COMPLEXITY: Low     GOALS: Goals reviewed with patient? Yes   SHORT TERM GOALS: Target date: 08/29/2022   Patient is independent with initial HEP for hip stretches and bulging of the pelvic floor.  Baseline: Goal status: Met 09/05/22   2.  Patient is able to equally contract the upper and lower abdomen.   Baseline:  Goal status: Met 09/05/22   3.  Patient reports her pain is 25% decreased due to less trigger points in the abdomen and pelvic floor.  Baseline:  Goal status: Met 09/26/22   4.  Patient is able to bulge her pelvic floor to relax.  Baseline:  Goal status: Met 09/05/2022     LONG TERM GOALS: Target date: 10/23/3032   Patient independent with advanced HEP for core and hips to reduce her pain.  Baseline:  Goal status: ongoing 09/26/22   2.  Patient reports her right lower quadrant pain decreased </= 0-1/10 due to improved muscle lengthening and less trigger points.  Baseline:  Goal status: ongoing 09/26/22   3.  Patient is able to exercise correctly in the gym with reduction of bulging of the lower abdomen and less strain on the pelvic floor.  Baseline:  Goal status: ongoing 09/26/22   4.  Patient able to have penile penetration with 0-1 pain level due to decreased trigger points in the pelvic floor.  Baseline:  Goal status: ongoing 09/26/22   5.  Patient able to sneeze and cough without urinary leakage due to improve pelvic floor coordination.  Baseline:  Goal status: ongoing 09/26/22   PLAN:   PT FREQUENCY: 1x/week   PT DURATION: 12 weeks   PLANNED INTERVENTIONS: Therapeutic exercises, Therapeutic activity, Neuromuscular re-education, Patient/Family education, Joint mobilization, Dry Needling, Electrical stimulation, Cryotherapy, Moist heat, Taping, Biofeedback, and Manual therapy   PLAN FOR NEXT SESSION:  supine bird dog and dead bug; internal work along the bladder and ovaries, work on left side of pelvic floor. Work on quick contraction for sneeze   Earlie Counts, PT 10/14/22 9:30 AM

## 2022-10-19 ENCOUNTER — Encounter: Payer: Self-pay | Admitting: Physical Therapy

## 2022-10-19 ENCOUNTER — Ambulatory Visit: Payer: BC Managed Care – PPO | Admitting: Physical Therapy

## 2022-10-19 DIAGNOSIS — M6281 Muscle weakness (generalized): Secondary | ICD-10-CM

## 2022-10-19 DIAGNOSIS — R252 Cramp and spasm: Secondary | ICD-10-CM

## 2022-10-19 DIAGNOSIS — R279 Unspecified lack of coordination: Secondary | ICD-10-CM | POA: Diagnosis not present

## 2022-10-19 NOTE — Therapy (Signed)
OUTPATIENT PHYSICAL THERAPY TREATMENT NOTE   Patient Name: [name] MRN: [id number] DOB:13-Mar-1981, 42 y.o., female Today's Date: 10/19/2022  PCP: Debbrah Alar, NP   REFERRING PROVIDER: Megan Salon, MD   END OF SESSION:   PT End of Session - 10/19/22 1524     Visit Number 7    Date for PT Re-Evaluation 10/24/22    Authorization Type [employer]    PT Start Time 1530    PT Stop Time 1620    PT Time Calculation (min) 50 min    Activity Tolerance Patient tolerated treatment well    Behavior During Therapy [facility] for tasks assessed/performed             Past Medical History:  Diagnosis Date   Abnormal Pap smear of cervix    2008 or 2009   Anxiety    Dental bridge present    lower front   Dry eyes    Endometriosis    Headache    Migraines   History of indigestion    treat with OTC   Hypoglycemia    Irritable bowel syndrome (IBS)    Seasonal allergies    Trigger thumb of right hand 11/2013   Past Surgical History:  Procedure Laterality Date   CHOLECYSTECTOMY  06/2015   CHROMOPERTUBATION N/A 05/30/2016   Procedure: CHROMOPERTUBATION;  Surgeon: Megan Salon, MD;  Location: [facility];  Service: Gynecology;  Laterality: N/A;   COLPOSCOPY  2008   CYSTOSCOPY N/A 08/21/2017   Procedure: CYSTOSCOPY;  Surgeon: Megan Salon, MD;  Location: [facility];  Service: Gynecology;  Laterality: N/A;   LABIOPLASTY N/A 08/21/2017   Procedure: LABIAPLASTY;  Surgeon: Megan Salon, MD;  Location: [facility];  Service: Gynecology;  Laterality: N/A;   LAPAROSCOPY N/A 05/30/2016   Procedure: LAPAROSCOPY OPERATIVE WITH LYSIS OF ADHESIONS, EXCISION OF POSSIBLE ENDOMETRIOSIS;  Surgeon: Megan Salon, MD;  Location: [facility];  Service: Gynecology;  Laterality: N/A;   REFRACTIVE SURGERY Bilateral 02/2015   TONSILLECTOMY  1988   TOTAL LAPAROSCOPIC HYSTERECTOMY WITH SALPINGECTOMY Bilateral 08/21/2017   Procedure: TOTAL LAPAROSCOPIC HYSTERECTOMY WITH SALPINGECTOMY;  Surgeon: Megan Salon, MD;   Location: [facility];  Service: Gynecology;  Laterality: Bilateral;   TRIGGER FINGER RELEASE Right 12/23/2013   Procedure: RIGHT THUMB TRIGGER RELEASE ;  Surgeon: Tennis Must, MD;  Location: [place];  Service: Orthopedics;  Laterality: Right;   Patient Active Problem List   Diagnosis Date Noted   COVID-19 virus infection 09/21/2022   Palpitations 09/21/2022   Seasonal allergies 04/21/2022   Atypical chest pain 04/21/2022   Preventative health care 04/16/2021   Migraine with aura and without status migrainosus, not intractable 12/01/2020   Urinary urgency 12/01/2020   Increased risk of breast cancer 12/01/2020   H/O: hysterectomy 11/27/2019   Hyperlipidemia    Endometriosis 05/01/2017   Spasm of bowel 01/29/2016   IBS (irritable bowel syndrome) 01/29/2016   REFERRING DIAG: M62.89 (ICD-10-CM) - Pelvic floor dysfunction    THERAPY DIAG:  R27.9 Unspecified lack of coordination; M62.81 Muscle weakness( generalized) R25.2 Cramp and spasm   Rationale for Evaluation and Treatment: Rehabilitation   ONSET DATE: 2018   SUBJECTIVE:  SUBJECTIVE STATEMENT: I am a little flared up since last visit. I wake up at night and it bothers me. Stays a 3/10. I still feel the pressure when the bladder is full. No pain with intercourse.     PAIN:  Are you having pain? Yes NPRS scale: 3/10 Pain location: Right lower quadrant   Pain type: dull Pain description: intermittent    Aggravating factors: touching the area Relieving factors: ibuprofen, heating pad   PAIN:  Are you having pain? Yes: NPRS scale: 0/10 Pain location: bladder Pain description: full bladder, pressure Aggravating factors: full bladder, alcohol  Relieving factors: urinate     PRECAUTIONS: None   WEIGHT BEARING  RESTRICTIONS: No   FALLS:  Has patient fallen in last 6 months? No   LIVING ENVIRONMENT: Lives with: lives with their family   OCCUPATION: surgical tech in operating room , workouts include yoga, weights, and cardio   PLOF: Independent   PATIENT GOALS: work on right lower quadrant pain   PERTINENT HISTORY:  Endometriosis; IBS, labioplasty, cholecystectomy, laparoscopy with lysis of adhesions and possible endometriosis; Hysterectomy Sexual abuse: No   BOWEL MOVEMENT: Pain with bowel movement: No   URINATION: Pain with urination: Yes sometimes Fully empty bladder: Yes: sometimes has to go a second time to fully empty bladder Stream: Weak compared to before her hysterectomy Urgency: Yes:   Frequency: sometimes every hour,  Leakage: Coughing and Sneezing Pads: No   INTERCOURSE: Pain with intercourse: During Penetration, deep toward the right side Ability to have vaginal penetration:  Yes:   Climax: struggle Marinoff Scale: 2/3     PROLAPSE: None     OBJECTIVE:    DIAGNOSTIC FINDINGS:  Endo confirmed     COGNITION: Overall cognitive status: Within functional limits for tasks assessed                          SENSATION: Light touch: Appears intact Proprioception: Appears intact       POSTURE: decreased lumbar lordosis   PELVIC ALIGNMENT: bilateral ASIS are equal   LUMBARAROM/PROM:   A/PROM A/PROM  eval  Extension Decreased by 25%  Left rotation Decreased by 25%   (Blank rows = not tested)   LOWER EXTREMITY ROM: full bilateral hip ROM     LOWER EXTREMITY MMT:   MMT Right eval Left eval  Hip extension 5/5 4/5  Hip abduction 4/5 3+/5    PALPATION:   General  right abdominal has trigger points, increased tenderness located in the right lower abdomen, contraction of abdominals and bulges the lower abdomen, tenderness at the quadratus by the ribs left worse than right.                  External Perineal Exam intact and good coloring                              Internal Pelvic Floor tenderness located in the right obturator internist, along the right side of the urethra and bladder.    Patient confirms identification and approves PT to assess internal pelvic floor and treatment Yes   PELVIC MMT:   MMT eval 09/05/22 10/14/22  Vaginal 4/5 posterior and 3/5 anterior 4/5 with good hug of therapist finger 4/5        TONE: increased   PROLAPSE: none   TODAY'S TREATMENT:   10/19/22 Manual: Soft tissue mobilization: To assess for dry needling Manual  work to the diaphragm, abdominals, quadratus, and lumbar paraspinals Myofascial release: Using a suction cup to the abdominals and lumbar paraspinals Diaphragmatic breathing with therapist guiding the lower rib cage with expansion and coming together Spinal mobilization: Sideglide to L1-L5 from right to left grade 3 Trigger Point Dry-Needling  Treatment instructions: Expect mild to moderate muscle soreness. S/S of pneumothorax if dry needled over a lung field, and to seek immediate medical attention should they occur. Patient verbalized understanding of these instructions and education.  Patient Consent Given: Yes Education handout provided: Previously provided Muscles treated: right psoas,bilateral rectus, lumbar multifidi, quadratus Electrical stimulation performed: No Parameters: N/A Treatment response/outcome: elogation of muscle and trigger point response Exercises: Stretches/mobility: Prone on ball and use it to massage the lower abdomen  10/14/22 Manual: Internal pelvic floor techniques: No emotional/communication barriers or cognitive limitation. Patient is motivated to learn. Patient understands and agrees with treatment goals and plan. PT explains patient will be examined in standing, sitting, and lying down to see how their muscles and joints work. When they are ready, they will be asked to remove their underwear so PT can examine their perineum. The patient is also given  the option of providing their own chaperone as one is not provided in our facility. The patient also has the right and is explained the right to defer or refuse any part of the evaluation or treatment including the internal exam. With the patient's consent, PT will use one gloved finger to gently assess the muscles of the pelvic floor, seeing how well it contracts and relaxes and if there is muscle symmetry. After, the patient will get dressed and PT and patient will discuss exam findings and plan of care. PT and patient discuss plan of care, schedule, attendance policy and HEP activities.  Going through the vaginal canal working on the anterior vaginal wall on the right with other hand on the right lower abdomen releasing along the tissue and ovary while monitoring for pain.     09/26/22 Manual: Myofascial release: Fascial release of the right lower quadrant going through the restrictions  Internal pelvic floor techniques: No emotional/communication barriers or cognitive limitation. Patient is motivated to learn. Patient understands and agrees with treatment goals and plan. PT explains patient will be examined in standing, sitting, and lying down to see how their muscles and joints work. When they are ready, they will be asked to remove their underwear so PT can examine their perineum. The patient is also given the option of providing their own chaperone as one is not provided in our facility. The patient also has the right and is explained the right to defer or refuse any part of the evaluation or treatment including the internal exam. With the patient's consent, PT will use one gloved finger to gently assess the muscles of the pelvic floor, seeing how well it contracts and relaxes and if there is muscle symmetry. After, the patient will get dressed and PT and patient will discuss exam findings and plan of care. PT and patient discuss plan of care, schedule, attendance policy and HEP activities.  Going  through the vaginal working on the right levator ani, right obturator internist while monitoring for pain and tissue relaxation Educated patient on how to use the vaginal wand and hold for 90 seconds with trigger point, pressure is not to be more than the pressure you put on a tomato          PATIENT EDUCATION:  09/02/2022 Education details: Access Code:  [email or web address], massage of the lower abdomen Person educated: Patient Education method: Explanation, Demonstration, Tactile cues, Verbal cues, and Handouts Education comprehension: verbalized understanding, returned demonstration, verbal cues required, tactile cues required, and needs further education   HOME EXERCISE PROGRAM: 09/02/2022 Access Code: [email or web address] URL: [email or web address] Date: 09/02/2022 Prepared by: Earlie Counts   Program Notes massaging the lower abdominals   Exercises - - Supine March  - 1 x daily - 3 x weekly - 2 sets - 10 reps       ASSESSMENT:   CLINICAL IMPRESSION: Patient is a 43 y.o. female who was seen today for physical therapy  treatment for pelvic floor dysfunction. Patient is having a flare-up with her right lower quadrant pain that is waking her up at night. Patient is not having pain with intercourse.  Her pain decreased to 1/10 after the manual work. She had many trigger points in the rectus and quadratus. She had decreased movement of the L3 vertebrae. Patient pain may be more muscular. Patient will benefit from skilled therapy to improve pelvic floor coordination and reduce trigger points to reduce pain.    OBJECTIVE IMPAIRMENTS: decreased activity tolerance, decreased coordination, decreased endurance, decreased strength, increased fascial restrictions, increased muscle spasms, impaired tone, and pain.    ACTIVITY LIMITATIONS: carrying, lifting, bending, sitting, standing, sleeping, transfers, continence, and toileting   PARTICIPATION LIMITATIONS: meal prep, cleaning, laundry, interpersonal  relationship, shopping, community activity, and occupation   PERSONAL FACTORS: Past/current experiences and 1-2 comorbidities: Endometriosis; IBS, labioplasty, cholecystectomy, laparoscopy with lysis of adhesions and possible endometriosis; Hysterectomy  are also affecting patient's functional outcome.    REHAB POTENTIAL: Excellent   CLINICAL DECISION MAKING: Stable/uncomplicated   EVALUATION COMPLEXITY: Low     GOALS: Goals reviewed with patient? Yes   SHORT TERM GOALS: Target date: 08/29/2022   Patient is independent with initial HEP for hip stretches and bulging of the pelvic floor.  Baseline: Goal status: Met 09/05/22   2.  Patient is able to equally contract the upper and lower abdomen.  Baseline:  Goal status: Met 09/05/22   3.  Patient reports her pain is 25% decreased due to less trigger points in the abdomen and pelvic floor.  Baseline:  Goal status: Met 09/26/22   4.  Patient is able to bulge her pelvic floor to relax.  Baseline:  Goal status: Met 09/05/2022     LONG TERM GOALS: Target date: 10/23/3032   Patient independent with advanced HEP for core and hips to reduce her pain.  Baseline:  Goal status: ongoing 09/26/22   2.  Patient reports her right lower quadrant pain decreased </= 0-1/10 due to improved muscle lengthening and less trigger points.  Baseline:  Goal status: ongoing 09/26/22   3.  Patient is able to exercise correctly in the gym with reduction of bulging of the lower abdomen and less strain on the pelvic floor.  Baseline:  Goal status: ongoing 09/26/22   4.  Patient able to have penile penetration with 0-1 pain level due to decreased trigger points in the pelvic floor.  Baseline:  Goal status: ongoing 09/26/22   5.  Patient able to sneeze and cough without urinary leakage due to improve pelvic floor coordination.  Baseline:  Goal status: ongoing 09/26/22   PLAN:   PT FREQUENCY: 1x/week   PT DURATION: 12 weeks   PLANNED INTERVENTIONS: Therapeutic  exercises, Therapeutic activity, Neuromuscular re-education, Patient/Family education, Joint mobilization, Dry Needling, Electrical stimulation, Cryotherapy, Moist heat, Taping, Biofeedback, and Manual therapy   PLAN  FOR NEXT SESSION:  supine bird dog and dead bug; internal work along the bladder and ovaries, work on left side of pelvic floor. Work on quick contraction for sneeze, write renewal   Earlie Counts, PT 10/19/22 4:33 PM

## 2022-11-09 ENCOUNTER — Ambulatory Visit: Payer: BC Managed Care – PPO | Attending: Obstetrics & Gynecology | Admitting: Physical Therapy

## 2022-11-16 ENCOUNTER — Encounter: Payer: Self-pay | Admitting: Physical Therapy

## 2022-11-16 ENCOUNTER — Ambulatory Visit: Payer: BC Managed Care – PPO | Attending: Obstetrics & Gynecology | Admitting: Physical Therapy

## 2022-11-16 DIAGNOSIS — R252 Cramp and spasm: Secondary | ICD-10-CM | POA: Insufficient documentation

## 2022-11-16 DIAGNOSIS — M6281 Muscle weakness (generalized): Secondary | ICD-10-CM | POA: Insufficient documentation

## 2022-11-16 DIAGNOSIS — R279 Unspecified lack of coordination: Secondary | ICD-10-CM | POA: Diagnosis not present

## 2022-11-16 NOTE — Therapy (Signed)
OUTPATIENT PHYSICAL THERAPY TREATMENT NOTE   Patient Name: [name] MRN: [id number] DOB:[birth year], 42 y.o., female Today's Date: 11/16/2022  PCP: Debbrah Alar, NP  REFERRING PROVIDER:  Megan Salon, MD   END OF SESSION:   PT End of Session - 11/16/22 1616     Visit Number 8    Date for PT Re-Evaluation 01/16/23    Authorization Type [employer]    PT Start Time 1615    PT Stop Time 1655    PT Time Calculation (min) 40 min    Activity Tolerance Patient tolerated treatment well    Behavior During Therapy [facility] for tasks assessed/performed             Past Medical History:  Diagnosis Date   Abnormal Pap smear of cervix    2008 or 2009   Anxiety    Dental bridge present    lower front   Dry eyes    Endometriosis    Headache    Migraines   History of indigestion    treat with OTC   Hypoglycemia    Irritable bowel syndrome (IBS)    Seasonal allergies    Trigger thumb of right hand 11/2013   Past Surgical History:  Procedure Laterality Date   CHOLECYSTECTOMY  06/2015   CHROMOPERTUBATION N/A 05/30/2016   Procedure: CHROMOPERTUBATION;  Surgeon: Megan Salon, MD;  Location: [facility];  Service: Gynecology;  Laterality: N/A;   COLPOSCOPY  2008   CYSTOSCOPY N/A 08/21/2017   Procedure: CYSTOSCOPY;  Surgeon: Megan Salon, MD;  Location: [facility];  Service: Gynecology;  Laterality: N/A;   LABIOPLASTY N/A 08/21/2017   Procedure: LABIAPLASTY;  Surgeon: Megan Salon, MD;  Location: [facility];  Service: Gynecology;  Laterality: N/A;   LAPAROSCOPY N/A 05/30/2016   Procedure: LAPAROSCOPY OPERATIVE WITH LYSIS OF ADHESIONS, EXCISION OF POSSIBLE ENDOMETRIOSIS;  Surgeon: Megan Salon, MD;  Location: [facility];  Service: Gynecology;  Laterality: N/A;   REFRACTIVE SURGERY Bilateral 02/2015   TONSILLECTOMY  1988   TOTAL LAPAROSCOPIC HYSTERECTOMY WITH SALPINGECTOMY Bilateral 08/21/2017   Procedure: TOTAL LAPAROSCOPIC HYSTERECTOMY WITH SALPINGECTOMY;  Surgeon: Megan Salon, MD;   Location: [facility];  Service: Gynecology;  Laterality: Bilateral;   TRIGGER FINGER RELEASE Right 12/23/2013   Procedure: RIGHT THUMB TRIGGER RELEASE ;  Surgeon: Tennis Must, MD;  Location: [place];  Service: Orthopedics;  Laterality: Right;   Patient Active Problem List   Diagnosis Date Noted   COVID-19 virus infection 09/21/2022   Palpitations 09/21/2022   Seasonal allergies 04/21/2022   Atypical chest pain 04/21/2022   Preventative health care 04/16/2021   Migraine with aura and without status migrainosus, not intractable 12/01/2020   Urinary urgency 12/01/2020   Increased risk of breast cancer 12/01/2020   H/O: hysterectomy 11/27/2019   Hyperlipidemia    Endometriosis 05/01/2017   Spasm of bowel 01/29/2016   IBS (irritable bowel syndrome) 01/29/2016   REFERRING DIAG: M62.89 (ICD-10-CM) - Pelvic floor dysfunction    THERAPY DIAG:  R27.9 Unspecified lack of coordination; M62.81 Muscle weakness( generalized) R25.2 Cramp and spasm   Rationale for Evaluation and Treatment: Rehabilitation   ONSET DATE: 2018   SUBJECTIVE:  SUBJECTIVE STATEMENT: I felt right after last treatment. I have not had as many flare ups. The pain has not been super intense.       PAIN:  Are you having pain? Yes NPRS scale: 3/10 Pain location: Right lower quadrant   Pain type: dull Pain description: intermittent    Aggravating factors: touching the area Relieving factors: ibuprofen, heating pad   PAIN:  Are you having pain? Yes: NPRS scale: 3/10 Pain location: bladder Pain description: full bladder will feel pressure, no spasms Aggravating factors: full bladder, alcohol  Relieving factors: urinate     PRECAUTIONS: None   WEIGHT BEARING RESTRICTIONS: No   FALLS:  Has patient fallen in last  6 months? No   LIVING ENVIRONMENT: Lives with: lives with their family   OCCUPATION: surgical tech in operating room , workouts include yoga, weights, and cardio   PLOF: Independent   PATIENT GOALS: work on right lower quadrant pain   PERTINENT HISTORY:  Endometriosis; IBS, labioplasty, cholecystectomy, laparoscopy with lysis of adhesions and possible endometriosis; Hysterectomy Sexual abuse: No   BOWEL MOVEMENT: Pain with bowel movement: No   URINATION: Pain with urination: Yes sometimes Fully empty bladder: Yes: sometimes has to go a second time to fully empty bladder Stream: Weak compared to before her hysterectomy Urgency: Yes:   Frequency: sometimes every hour,  Leakage: Coughing and Sneezing Pads: No   INTERCOURSE: Pain with intercourse: During Penetration, deep toward the right side Ability to have vaginal penetration:  Yes:   Climax: struggle Marinoff Scale: 2/3     PROLAPSE: None     OBJECTIVE:    DIAGNOSTIC FINDINGS:  Endo confirmed     COGNITION: Overall cognitive status: Within functional limits for tasks assessed                          SENSATION: Light touch: Appears intact Proprioception: Appears intact       POSTURE: decreased lumbar lordosis   PELVIC ALIGNMENT: bilateral ASIS are equal   LUMBARAROM/PROM:   A/PROM A/PROM  eval  Extension Decreased by 25%  Left rotation Decreased by 25%   (Blank rows = not tested)   LOWER EXTREMITY ROM: full bilateral hip ROM     LOWER EXTREMITY MMT:   MMT Right eval Left eval Right/left 11/16/22  Hip extension 5/5 4/5 5/5  Hip abduction 4/5 3+/5 5/5    PALPATION:   General  right abdominal has trigger points, increased tenderness located in the right lower abdomen, contraction of abdominals and bulges the lower abdomen, tenderness at the quadratus by the ribs left worse than right.                  External Perineal Exam intact and good coloring                             Internal  Pelvic Floor tenderness located in the right obturator internist, along the right side of the urethra and bladder.    Patient confirms identification and approves PT to assess internal pelvic floor and treatment Yes   PELVIC MMT:   MMT eval 09/05/22 10/14/22  Vaginal 4/5 posterior and 3/5 anterior 4/5 with good hug of therapist finger 4/5        TONE: increased   PROLAPSE: none   TODAY'S TREATMENT:   11/16/22 Manual: Soft tissue mobilization: To assess for dry needling Manual work to the diaphragm,  abdominals, quadratus, and lumbar paraspinals  Trigger Point Dry-Needling  Treatment instructions: Expect mild to moderate muscle soreness. S/S of pneumothorax if dry needled over a lung field, and to seek immediate medical attention should they occur. Patient verbalized understanding of these instructions and education.  Patient Consent Given: Yes Education handout provided: Previously provided Muscles treated: right psoas,bilateral rectus, lumbar multifidi, quadratus,right gluteus medius Electrical stimulation performed: No Parameters: N/A Treatment response/outcome: elogation of muscle and trigger point response  Exercises: Strengthening:all need verbal cues to engage the lower abdominals.  Dead bug with lower abdominal engagement Bird dog Supine curl-up with small ball behind back 10x Diagonal curl-up with small ball behind back 10x Supine hip circles with knees bend 10x   10/19/22 Manual: Soft tissue mobilization: To assess for dry needling Manual work to the diaphragm, abdominals, quadratus, and lumbar paraspinals Myofascial release: Using a suction cup to the abdominals and lumbar paraspinals Diaphragmatic breathing with therapist guiding the lower rib cage with expansion and coming together Spinal mobilization: Sideglide to L1-L5 from right to left grade 3 Trigger Point Dry-Needling  Treatment instructions: Expect mild to moderate muscle soreness. S/S of pneumothorax  if dry needled over a lung field, and to seek immediate medical attention should they occur. Patient verbalized understanding of these instructions and education.   Patient Consent Given: Yes Education handout provided: Previously provided Muscles treated: right psoas,bilateral rectus, lumbar multifidi, quadratus Electrical stimulation performed: No Parameters: N/A Treatment response/outcome: elogation of muscle and trigger point response Exercises: Stretches/mobility: Prone on ball and use it to massage the lower abdomen   10/14/22 Manual: Internal pelvic floor techniques: No emotional/communication barriers or cognitive limitation. Patient is motivated to learn. Patient understands and agrees with treatment goals and plan. PT explains patient will be examined in standing, sitting, and lying down to see how their muscles and joints work. When they are ready, they will be asked to remove their underwear so PT can examine their perineum. The patient is also given the option of providing their own chaperone as one is not provided in our facility. The patient also has the right and is explained the right to defer or refuse any part of the evaluation or treatment including the internal exam. With the patient's consent, PT will use one gloved finger to gently assess the muscles of the pelvic floor, seeing how well it contracts and relaxes and if there is muscle symmetry. After, the patient will get dressed and PT and patient will discuss exam findings and plan of care. PT and patient discuss plan of care, schedule, attendance policy and HEP activities.  Going through the vaginal canal working on the anterior vaginal wall on the right with other hand on the right lower abdomen releasing along the tissue and ovary while monitoring for pain.    PATIENT EDUCATION:  11/16/2022 Education details: Access Code: [email or web address], massage of the lower abdomen Person educated: Patient Education method: Explanation,  Demonstration, Tactile cues, Verbal cues, and Handouts Education comprehension: verbalized understanding, returned demonstration, verbal cues required, tactile cues required, and needs further education   HOME EXERCISE PROGRAM: 11/16/2022 Access Code: [email or web address] URL: [email or web address] Date: 11/16/2022 Prepared by: Earlie Counts  Program Notes massaging the lower abdominals  Exercises - Supine Diaphragmatic Breathing  - 1 x daily - 7 x weekly - 1 sets - 10 reps - Seated Diaphragmatic Breathing  - 2 x daily - 7 x weekly - 1 sets - 10 reps - Seated Lean Back on Swiss Ball  - 1 x daily -  7 x weekly - 1 sets - 10 reps - Dead Bug  - 1 x daily - 3 x weekly - 2 sets - 10 reps - Bird Dog  - 1 x daily - 3 x weekly - 2 sets - 10 reps - Curl Up on Mini Swiss Ball  - 1 x daily - 3 x weekly - 3 sets - 10 reps - Oblique Curl Up on Mini Swiss Ball  - 1 x daily - 3 x weekly - 3 sets - 10 reps  Patient Education - Trigger Point Dry Needling   ASSESSMENT:   CLINICAL IMPRESSION: Patient is a 42 y.o. female who was seen today for physical therapy  treatment for pelvic floor dysfunction. Patient is not having the spasms of the bladder. She is not having as much intense right abdominal pain.  Patient is able to workout at the gym without pain. Patient continues to have some leakage with coughing and sneezing. No pain with intercourse anymore. Patient still needs direction on correctly engaging her core. She still has trigger points in the right gluteus medius, psoas, quadratus and rectus. Patient will benefit from skilled therapy to improve pelvic floor coordination and reduce trigger points to reduce pain.    OBJECTIVE IMPAIRMENTS: decreased activity tolerance, decreased coordination, decreased endurance, decreased strength, increased fascial restrictions, increased muscle spasms, impaired tone, and pain.    ACTIVITY LIMITATIONS: carrying, lifting, bending, sitting, standing, sleeping,  transfers, continence, and toileting   PARTICIPATION LIMITATIONS: meal prep, cleaning, laundry, interpersonal relationship, shopping, community activity, and occupation   PERSONAL FACTORS: Past/current experiences and 1-2 comorbidities: Endometriosis; IBS, labioplasty, cholecystectomy, laparoscopy with lysis of adhesions and possible endometriosis; Hysterectomy  are also affecting patient's functional outcome.    REHAB POTENTIAL: Excellent   CLINICAL DECISION MAKING: Stable/uncomplicated   EVALUATION COMPLEXITY: Low     GOALS: Goals reviewed with patient? Yes   SHORT TERM GOALS: Target date: 08/29/2022   Patient is independent with initial HEP for hip stretches and bulging of the pelvic floor.  Baseline: Goal status: Met 09/05/22   2.  Patient is able to equally contract the upper and lower abdomen.  Baseline:  Goal status: Met 09/05/22   3.  Patient reports her pain is 25% decreased due to less trigger points in the abdomen and pelvic floor.  Baseline:  Goal status: Met 09/26/22   4.  Patient is able to bulge her pelvic floor to relax.  Baseline:  Goal status: Met 09/05/2022     LONG TERM GOALS: Target date: 10/23/3032   Patient independent with advanced HEP for core and hips to reduce her pain.  Baseline:  Goal status: ongoing 11/16/22   2.  Patient reports her right lower quadrant pain decreased </= 0-1/10 due to improved muscle lengthening and less trigger points.  Baseline: 3/10 not as many intense flare-ups Goal status: ongoing 11/16/22   3.  Patient is able to exercise correctly in the gym with reduction of bulging of the lower abdomen and less strain on the pelvic floor.  Baseline:  Goal status: Met 11/16/22   4.  Patient able to have penile penetration with 0-1 pain level due to decreased trigger points in the pelvic floor.  Baseline:  Goal status: met 11/16/22   5.  Patient able to sneeze and cough without urinary leakage due to improve pelvic floor coordination.   Baseline: occasionally Goal status: ongoing 11/16/22   PLAN:   PT FREQUENCY: 1x/week   PT DURATION: 12 weeks   PLANNED  INTERVENTIONS: Therapeutic exercises, Therapeutic activity, Neuromuscular re-education, Patient/Family education, Joint mobilization, Dry Needling, Electrical stimulation, Cryotherapy, Moist heat, Taping, Biofeedback, and Manual therapy   PLAN FOR NEXT SESSION:  dry needle if needed, go over gym program to engage the core correctly,  Work on quick contraction for sneeze,  Earlie Counts, PT 11/16/22 4:53 PM

## 2022-11-23 ENCOUNTER — Encounter: Payer: Self-pay | Admitting: Physical Therapy

## 2022-11-23 ENCOUNTER — Ambulatory Visit: Payer: Managed Care, Other (non HMO) | Attending: Obstetrics & Gynecology | Admitting: Physical Therapy

## 2022-11-23 DIAGNOSIS — R279 Unspecified lack of coordination: Secondary | ICD-10-CM | POA: Diagnosis present

## 2022-11-23 DIAGNOSIS — R252 Cramp and spasm: Secondary | ICD-10-CM | POA: Diagnosis present

## 2022-11-23 DIAGNOSIS — M6281 Muscle weakness (generalized): Secondary | ICD-10-CM | POA: Diagnosis present

## 2022-11-23 NOTE — Therapy (Signed)
OUTPATIENT PHYSICAL THERAPY TREATMENT NOTE   Patient Name: [name] MRN: [id number] DOB:26-Sep-1980, 42 y.o., female Today's Date: 11/23/2022  PCP: Debbrah Alar, NP  REFERRING PROVIDER: Megan Salon, MD   END OF SESSION:   PT End of Session - 11/23/22 1616     Visit Number 9    Date for PT Re-Evaluation 01/16/23    Authorization Type [employer]    PT Start Time 1615    PT Stop Time 1655    PT Time Calculation (min) 40 min    Activity Tolerance Patient tolerated treatment well    Behavior During Therapy [facility] for tasks assessed/performed             Past Medical History:  Diagnosis Date   Abnormal Pap smear of cervix    2008 or 2009   Anxiety    Dental bridge present    lower front   Dry eyes    Endometriosis    Headache    Migraines   History of indigestion    treat with OTC   Hypoglycemia    Irritable bowel syndrome (IBS)    Seasonal allergies    Trigger thumb of right hand 11/2013   Past Surgical History:  Procedure Laterality Date   CHOLECYSTECTOMY  06/2015   CHROMOPERTUBATION N/A 05/30/2016   Procedure: CHROMOPERTUBATION;  Surgeon: Megan Salon, MD;  Location: [facility];  Service: Gynecology;  Laterality: N/A;   COLPOSCOPY  2008   CYSTOSCOPY N/A 08/21/2017   Procedure: CYSTOSCOPY;  Surgeon: Megan Salon, MD;  Location: [facility];  Service: Gynecology;  Laterality: N/A;   LABIOPLASTY N/A 08/21/2017   Procedure: LABIAPLASTY;  Surgeon: Megan Salon, MD;  Location: [facility];  Service: Gynecology;  Laterality: N/A;   LAPAROSCOPY N/A 05/30/2016   Procedure: LAPAROSCOPY OPERATIVE WITH LYSIS OF ADHESIONS, EXCISION OF POSSIBLE ENDOMETRIOSIS;  Surgeon: Megan Salon, MD;  Location: [facility];  Service: Gynecology;  Laterality: N/A;   REFRACTIVE SURGERY Bilateral 02/2015   TONSILLECTOMY  1988   TOTAL LAPAROSCOPIC HYSTERECTOMY WITH SALPINGECTOMY Bilateral 08/21/2017   Procedure: TOTAL LAPAROSCOPIC HYSTERECTOMY WITH SALPINGECTOMY;  Surgeon: Megan Salon, MD;   Location: [facility];  Service: Gynecology;  Laterality: Bilateral;   TRIGGER FINGER RELEASE Right 12/23/2013   Procedure: RIGHT THUMB TRIGGER RELEASE ;  Surgeon: Tennis Must, MD;  Location: [facility];  Service: Orthopedics;  Laterality: Right;   Patient Active Problem List   Diagnosis Date Noted   COVID-19 virus infection 09/21/2022   Palpitations 09/21/2022   Seasonal allergies 04/21/2022   Atypical chest pain 04/21/2022   Preventative health care 04/16/2021   Migraine with aura and without status migrainosus, not intractable 12/01/2020   Urinary urgency 12/01/2020   Increased risk of breast cancer 12/01/2020   H/O: hysterectomy 11/27/2019   Hyperlipidemia    Endometriosis 05/01/2017   Spasm of bowel 01/29/2016   IBS (irritable bowel syndrome) 01/29/2016   REFERRING DIAG: M62.89 (ICD-10-CM) - Pelvic floor dysfunction    THERAPY DIAG:  R27.9 Unspecified lack of coordination; M62.81 Muscle weakness( generalized) R25.2 Cramp and spasm   Rationale for Evaluation and Treatment: Rehabilitation   ONSET DATE: 2018   SUBJECTIVE:  SUBJECTIVE STATEMENT: Pain is better after last treatment. I did my abdominals and had some discomfort in the right lower quadrant. No pain with intercourse.     PAIN:  Are you having pain? Yes NPRS scale: 3/10 Pain location: Right lower quadrant   Pain type: dull Pain description: intermittent    Aggravating factors: touching the area Relieving factors: ibuprofen, heating pad   PAIN:  Are you having pain? Yes: NPRS scale: 3/10 Pain location: bladder Pain description: full bladder will feel pressure, no spasms Aggravating factors: full bladder, alcohol  Relieving factors: urinate     PRECAUTIONS: None   WEIGHT BEARING RESTRICTIONS: No   FALLS:   Has patient fallen in last 6 months? No   LIVING ENVIRONMENT: Lives with: lives with their family   OCCUPATION: surgical tech in operating room , workouts include yoga, weights, and cardio   PLOF: Independent   PATIENT GOALS: work on right lower quadrant pain   PERTINENT HISTORY:  Endometriosis; IBS, labioplasty, cholecystectomy, laparoscopy with lysis of adhesions and possible endometriosis; Hysterectomy Sexual abuse: No   BOWEL MOVEMENT: Pain with bowel movement: No   URINATION: Pain with urination: Yes sometimes Fully empty bladder: Yes: sometimes has to go a second time to fully empty bladder Stream: Weak compared to before her hysterectomy Urgency: Yes:   Frequency: sometimes every hour,  Leakage: Coughing and Sneezing Pads: No   INTERCOURSE: Pain with intercourse: During Penetration, deep toward the right side Ability to have vaginal penetration:  Yes:   Climax: struggle Marinoff Scale: 2/3     PROLAPSE: None     OBJECTIVE:    DIAGNOSTIC FINDINGS:  Endo confirmed     COGNITION: Overall cognitive status: Within functional limits for tasks assessed                          SENSATION: Light touch: Appears intact Proprioception: Appears intact       POSTURE: decreased lumbar lordosis   PELVIC ALIGNMENT: bilateral ASIS are equal   LUMBARAROM/PROM:   A/PROM A/PROM  eval  Extension Decreased by 25%  Left rotation Decreased by 25%   (Blank rows = not tested)   LOWER EXTREMITY ROM: full bilateral hip ROM     LOWER EXTREMITY MMT:   MMT Right eval Left eval Right/left 11/16/22  Hip extension 5/5 4/5 5/5  Hip abduction 4/5 3+/5 5/5    PALPATION:   General  right abdominal has trigger points, increased tenderness located in the right lower abdomen, contraction of abdominals and bulges the lower abdomen, tenderness at the quadratus by the ribs left worse than right.                  External Perineal Exam intact and good coloring                              Internal Pelvic Floor tenderness located in the right obturator internist, along the right side of the urethra and bladder.    Patient confirms identification and approves PT to assess internal pelvic floor and treatment Yes   PELVIC MMT:   MMT eval 09/05/22 10/14/22 11/23/22  Vaginal 4/5 posterior and 3/5 anterior 4/5 with good hug of therapist finger 4/5 4/5 with increased contraction on the right        TONE: increased   PROLAPSE: none   TODAY'S TREATMENT:   11/23/22 Manual: Internal pelvic floor techniques: No  emotional/communication barriers or cognitive limitation. Patient is motivated to learn. Patient understands and agrees with treatment goals and plan. PT explains patient will be examined in standing, sitting, and lying down to see how their muscles and joints work. When they are ready, they will be asked to remove their underwear so PT can examine their perineum. The patient is also given the option of providing their own chaperone as one is not provided in our facility. The patient also has the right and is explained the right to defer or refuse any part of the evaluation or treatment including the internal exam. With the patient's consent, PT will use one gloved finger to gently assess the muscles of the pelvic floor, seeing how well it contracts and relaxes and if there is muscle symmetry. After, the patient will get dressed and PT and patient will discuss exam findings and plan of care. PT and patient discuss plan of care, schedule, attendance policy and HEP activities.  Manual work to the right levator ani, puborectalis, with one hand internal and other external, using pelvic motion and diaphragmatic breathing to lengthen the muscles further.   Exercises: Stretches/mobility: Piriformis stretch with pulling leg over bil.  Z leg stretch going forward to the forward knee the lift up and reach upward bil  Frog leg stretch moving forward and back going through the  restrictions Pigeon pose with hip flexor stretch using the strap bil.  Hip flexor stretch laying on foam roll and leg off the mat  Strengthening: Pelvic floor contraction with therapist finger in the vaginal canal working on the right side contraction   11/16/22 Manual: Soft tissue mobilization: To assess for dry needling Manual work to the diaphragm, abdominals, quadratus, and lumbar paraspinals   Trigger Point Dry-Needling  Treatment instructions: Expect mild to moderate muscle soreness. S/S of pneumothorax if dry needled over a lung field, and to seek immediate medical attention should they occur. Patient verbalized understanding of these instructions and education.   Patient Consent Given: Yes Education handout provided: Previously provided Muscles treated: right psoas,bilateral rectus, lumbar multifidi, quadratus,right gluteus medius Electrical stimulation performed: No Parameters: N/A Treatment response/outcome: elogation of muscle and trigger point response   Exercises: Strengthening:all need verbal cues to engage the lower abdominals.  Dead bug with lower abdominal engagement Bird dog Supine curl-up with small ball behind back 10x Diagonal curl-up with small ball behind back 10x Supine hip circles with knees bend 10x    10/19/22 Manual: Soft tissue mobilization: To assess for dry needling Manual work to the diaphragm, abdominals, quadratus, and lumbar paraspinals Myofascial release: Using a suction cup to the abdominals and lumbar paraspinals Diaphragmatic breathing with therapist guiding the lower rib cage with expansion and coming together Spinal mobilization: Sideglide to L1-L5 from right to left grade 3 Trigger Point Dry-Needling  Treatment instructions: Expect mild to moderate muscle soreness. S/S of pneumothorax if dry needled over a lung field, and to seek immediate medical attention should they occur. Patient verbalized understanding of these instructions and  education.   Patient Consent Given: Yes Education handout provided: Previously provided Muscles treated: right psoas,bilateral rectus, lumbar multifidi, quadratus Electrical stimulation performed: No Parameters: N/A Treatment response/outcome: elogation of muscle and trigger point response Exercises: Stretches/mobility: Prone on ball and use it to massage the lower abdomen     PATIENT EDUCATION:  11/16/2022 Education details: Access Code: [email or web address], massage of the lower abdomen Person educated: Patient Education method: Explanation, Demonstration, Tactile cues, Verbal cues, and Handouts Education comprehension: verbalized understanding,  returned demonstration, verbal cues required, tactile cues required, and needs further education   HOME EXERCISE PROGRAM: 11/16/2022 Access Code: [email or web address] URL: [email or web address] Date: 11/16/2022 Prepared by: Earlie Counts   Program Notes massaging the lower abdominals   Exercises - Supine Diaphragmatic Breathing  - 1 x daily - 7 x weekly - 1 sets - 10 reps - Seated Diaphragmatic Breathing  - 2 x daily - 7 x weekly - 1 sets - 10 reps - Seated Lean Back on Swiss Ball  - 1 x daily - 7 x weekly - 1 sets - 10 reps - Dead Bug  - 1 x daily - 3 x weekly - 2 sets - 10 reps - Bird Dog  - 1 x daily - 3 x weekly - 2 sets - 10 reps - Curl Up on Mini Swiss Ball  - 1 x daily - 3 x weekly - 3 sets - 10 reps - Oblique Curl Up on Mini Swiss Ball  - 1 x daily - 3 x weekly - 3 sets - 10 reps   Patient Education - Trigger Point Dry Needling   ASSESSMENT:   CLINICAL IMPRESSION: Patient is a 42 y.o. female who was seen today for physical therapy  treatment for pelvic floor dysfunction. Pelvic floor strength is 4/5. Patient is having less pain in the right lower quadrant. Patient is using the wand and has some tenderness on the right side. Patient is able to feel her core with the exercises due to better isolation. She is not having pain with penile  penetration vaginally. She has not had urinary leakage with sneezing the past 2 weeks.  Patient will benefit from skilled therapy to improve pelvic floor coordination and reduce trigger points to reduce pain.    OBJECTIVE IMPAIRMENTS: decreased activity tolerance, decreased coordination, decreased endurance, decreased strength, increased fascial restrictions, increased muscle spasms, impaired tone, and pain.    ACTIVITY LIMITATIONS: carrying, lifting, bending, sitting, standing, sleeping, transfers, continence, and toileting   PARTICIPATION LIMITATIONS: meal prep, cleaning, laundry, interpersonal relationship, shopping, community activity, and occupation   PERSONAL FACTORS: Past/current experiences and 1-2 comorbidities: Endometriosis; IBS, labioplasty, cholecystectomy, laparoscopy with lysis of adhesions and possible endometriosis; Hysterectomy  are also affecting patient's functional outcome.    REHAB POTENTIAL: Excellent   CLINICAL DECISION MAKING: Stable/uncomplicated   EVALUATION COMPLEXITY: Low     GOALS: Goals reviewed with patient? Yes   SHORT TERM GOALS: Target date: 08/29/2022   Patient is independent with initial HEP for hip stretches and bulging of the pelvic floor.  Baseline: Goal status: Met 09/05/22   2.  Patient is able to equally contract the upper and lower abdomen.  Baseline:  Goal status: Met 09/05/22   3.  Patient reports her pain is 25% decreased due to less trigger points in the abdomen and pelvic floor.  Baseline:  Goal status: Met 09/26/22   4.  Patient is able to bulge her pelvic floor to relax.  Baseline:  Goal status: Met 09/05/2022     LONG TERM GOALS: Target date: 10/23/3032   Patient independent with advanced HEP for core and hips to reduce her pain.  Baseline:  Goal status: ongoing 11/16/22   2.  Patient reports her right lower quadrant pain decreased </= 0-1/10 due to improved muscle lengthening and less trigger points.  Baseline: 3/10 not as many  intense flare-ups Goal status: ongoing 11/16/22   3.  Patient is able to exercise correctly in the gym with reduction of bulging of the lower  abdomen and less strain on the pelvic floor.  Baseline:  Goal status: Met 11/16/22   4.  Patient able to have penile penetration with 0-1 pain level due to decreased trigger points in the pelvic floor.  Baseline:  Goal status: met 11/16/22   5.  Patient able to sneeze and cough without urinary leakage due to improve pelvic floor coordination.  Baseline: occasionally Goal status: ongoing 11/16/22   PLAN:   PT FREQUENCY: 1x/week   PT DURATION: 12 weeks   PLANNED INTERVENTIONS: Therapeutic exercises, Therapeutic activity, Neuromuscular re-education, Patient/Family education, Joint mobilization, Dry Needling, Electrical stimulation, Cryotherapy, Moist heat, Taping, Biofeedback, and Manual therapy   PLAN FOR NEXT SESSION:  dry needle if needed, go over gym program to engage the core correctly  Earlie Counts, PT 11/23/22 5:04 PM

## 2022-11-30 ENCOUNTER — Encounter: Payer: Self-pay | Admitting: Physical Therapy

## 2022-11-30 ENCOUNTER — Ambulatory Visit: Payer: Managed Care, Other (non HMO) | Admitting: Physical Therapy

## 2022-11-30 DIAGNOSIS — R279 Unspecified lack of coordination: Secondary | ICD-10-CM | POA: Diagnosis not present

## 2022-11-30 DIAGNOSIS — R252 Cramp and spasm: Secondary | ICD-10-CM

## 2022-11-30 DIAGNOSIS — M6281 Muscle weakness (generalized): Secondary | ICD-10-CM

## 2022-11-30 NOTE — Therapy (Signed)
OUTPATIENT PHYSICAL THERAPY TREATMENT NOTE   Patient Name: [name] MRN: [id number] DOB:Dec 21, 1980, 42 y.o., female Today's Date: 11/30/2022  PCP: Sandford Craze'Sullivan, melissa, NP  REFERRING PROVIDER:  Jerene BearsMiller, Mary S, MD   END OF SESSION:   PT End of Session - 11/30/22 1531     Visit Number 10    Date for PT Re-Evaluation 01/16/23    Authorization Type [employer]    PT Start Time 1530    PT Stop Time 1610    PT Time Calculation (min) 40 min    Activity Tolerance Patient tolerated treatment well    [facility] tasks assessed/performed             Past Medical History:  Diagnosis Date   Abnormal Pap smear of cervix    2008 or 2009   Anxiety    Dental bridge present    lower front   Dry eyes    Endometriosis    Headache    Migraines   History of indigestion    treat with OTC   Hypoglycemia    Irritable bowel syndrome (IBS)    Seasonal allergies    Trigger thumb of right hand 11/2013   Past Surgical History:  Procedure Laterality Date   CHOLECYSTECTOMY  06/2015   CHROMOPERTUBATION N/A 05/30/2016   Procedure: CHROMOPERTUBATION;  Surgeon: Jerene BearsMary S Miller, MD;  Location: [facility];  Service: Gynecology;  Laterality: N/A;   COLPOSCOPY  2008   CYSTOSCOPY N/A 08/21/2017   Procedure: CYSTOSCOPY;  Surgeon: Jerene BearsMiller, Mary S, MD;  Location: [facility];  Service: Gynecology;  Laterality: N/A;   LABIOPLASTY N/A 08/21/2017   Procedure: LABIAPLASTY;  Surgeon: Jerene BearsMiller, Mary S, MD;  Location: [facility];  Service: Gynecology;  Laterality: N/A;   LAPAROSCOPY N/A 05/30/2016   Procedure: LAPAROSCOPY OPERATIVE WITH LYSIS OF ADHESIONS, EXCISION OF POSSIBLE ENDOMETRIOSIS;  Surgeon: Jerene BearsMary S Miller, MD;  Location: [facility];  Service: Gynecology;  Laterality: N/A;   REFRACTIVE SURGERY Bilateral 02/2015   TONSILLECTOMY  1988   TOTAL LAPAROSCOPIC HYSTERECTOMY WITH SALPINGECTOMY Bilateral 08/21/2017   Procedure: TOTAL LAPAROSCOPIC HYSTERECTOMY WITH SALPINGECTOMY;  Surgeon: Jerene BearsMiller, Mary S, MD;   Location: [facility];  Service: Gynecology;  Laterality: Bilateral;   TRIGGER FINGER RELEASE Right 12/23/2013   Procedure: RIGHT THUMB TRIGGER RELEASE ;  Surgeon: Tami RibasKevin R Kuzma, MD;  Location: [facility];  Service: Orthopedics;  Laterality: Right;   Patient Active Problem List   Diagnosis Date Noted   COVID-19 virus infection 09/21/2022   Palpitations 09/21/2022   Seasonal allergies 04/21/2022   Atypical chest pain 04/21/2022   Preventative health care 04/16/2021   Migraine with aura and without status migrainosus, not intractable 12/01/2020   Urinary urgency 12/01/2020   Increased risk of breast cancer 12/01/2020   H/O: hysterectomy 11/27/2019   Hyperlipidemia    Endometriosis 05/01/2017   Spasm of bowel 01/29/2016   IBS (irritable bowel syndrome) 01/29/2016   REFERRING DIAG: M62.89 (ICD-10-CM) - Pelvic floor dysfunction    THERAPY DIAG:  R27.9 Unspecified lack of coordination; M62.81 Muscle weakness( generalized) R25.2 Cramp and spasm   Rationale for Evaluation and Treatment: Rehabilitation   ONSET DATE: 2018   SUBJECTIVE:  SUBJECTIVE STATEMENT: I did well until yesterday when I did yoga and had some pain. Overall pain is better and is less frequent. Patient is not having the average spasms when bladder is full, she will only feel the spasms when her bladder is full to capacity and not able to go to the bathroom.       PAIN:  Are you having pain? Yes NPRS scale: 3/10 Pain location: Right lower quadrant   Pain type: dull Pain description: intermittent    Aggravating factors: touching the area Relieving factors: ibuprofen, heating pad   PAIN:  Are you having pain? Yes: NPRS scale: 3/10 Pain location: bladder Pain description: full bladder will feel pressure, no  spasms Aggravating factors: full bladder, alcohol  Relieving factors: urinate     PRECAUTIONS: None   WEIGHT BEARING RESTRICTIONS: No   FALLS:  Has patient fallen in last 6 months? No   LIVING ENVIRONMENT: Lives with: lives with their family   OCCUPATION: surgical tech in operating room , workouts include yoga, weights, and cardio   PLOF: Independent   PATIENT GOALS: work on right lower quadrant pain   PERTINENT HISTORY:  Endometriosis; IBS, labioplasty, cholecystectomy, laparoscopy with lysis of adhesions and possible endometriosis; Hysterectomy Sexual abuse: No   BOWEL MOVEMENT: Pain with bowel movement: No   URINATION: Pain with urination: Yes sometimes Fully empty bladder: Yes: sometimes has to go a second time to fully empty bladder Stream: Weak compared to before her hysterectomy Urgency: Yes:   Frequency: sometimes every hour,  Leakage: Coughing and Sneezing Pads: No   INTERCOURSE: Pain with intercourse: During Penetration, deep toward the right side Ability to have vaginal penetration:  Yes:   Climax: struggle Marinoff Scale: 2/3     PROLAPSE: None     OBJECTIVE:    DIAGNOSTIC FINDINGS:  Endo confirmed     COGNITION: Overall cognitive status: Within functional limits for tasks assessed                          SENSATION: Light touch: Appears intact Proprioception: Appears intact       POSTURE: decreased lumbar lordosis   PELVIC ALIGNMENT: bilateral ASIS are equal   LUMBARAROM/PROM:   A/PROM A/PROM  eval  Extension Decreased by 25%  Left rotation Decreased by 25%   (Blank rows = not tested)   LOWER EXTREMITY ROM: full bilateral hip ROM     LOWER EXTREMITY MMT:   MMT Right eval Left eval Right/left 11/16/22  Hip extension 5/5 4/5 5/5  Hip abduction 4/5 3+/5 5/5    PALPATION:   General  right abdominal has trigger points, increased tenderness located in the right lower abdomen, contraction of abdominals and bulges the lower  abdomen, tenderness at the quadratus by the ribs left worse than right.                  External Perineal Exam intact and good coloring                             Internal Pelvic Floor tenderness located in the right obturator internist, along the right side of the urethra and bladder.    Patient confirms identification and approves PT to assess internal pelvic floor and treatment Yes   PELVIC MMT:   MMT eval 09/05/22 10/14/22 11/23/22  Vaginal 4/5 posterior and 3/5 anterior 4/5 with good hug of therapist finger 4/5 4/5  with increased contraction on the right        TONE: increased   PROLAPSE: none   TODAY'S TREATMENT:  11/30/22 Manual: Myofascial release: Fascial release along the right side of the fundus in left sidely going through the restricitons Internal pelvic floor techniques: No emotional/communication barriers or cognitive limitation. Patient is motivated to learn. Patient understands and agrees with treatment goals and plan. PT explains patient will be examined in standing, sitting, and lying down to see how their muscles and joints work. When they are ready, they will be asked to remove their underwear so PT can examine their perineum. The patient is also given the option of providing their own chaperone as one is not provided in our facility. The patient also has the right and is explained the right to defer or refuse any part of the evaluation or treatment including the internal exam. With the patient's consent, PT will use one gloved finger to gently assess the muscles of the pelvic floor, seeing how well it contracts and relaxes and if there is muscle symmetry. After, the patient will get dressed and PT and patient will discuss exam findings and plan of care. PT and patient discuss plan of care, schedule, attendance policy and HEP activities.  Going through the vagina and other hand externally working on the right side of the lower abdominal, releasing restrictions on the right  ovary, release superior bladder and manual work to the right psoas in supine Trigger Point Dry-Needling  Treatment instructions: Expect mild to moderate muscle soreness. S/S of pneumothorax if dry needled over a lung field, and to seek immediate medical attention should they occur. Patient verbalized understanding of these instructions and education.  Patient Consent Given: Yes Education handout provided: Previously provided Muscles treated: right psoas but too tender and fascia to tight for needle to go all the way in Electrical stimulation performed: No Parameters: N/A Treatment response/outcome: pain therefore did not finish    11/23/22 Manual: Internal pelvic floor techniques: No emotional/communication barriers or cognitive limitation. Patient is motivated to learn. Patient understands and agrees with treatment goals and plan. PT explains patient will be examined in standing, sitting, and lying down to see how their muscles and joints work. When they are ready, they will be asked to remove their underwear so PT can examine their perineum. The patient is also given the option of providing their own chaperone as one is not provided in our facility. The patient also has the right and is explained the right to defer or refuse any part of the evaluation or treatment including the internal exam. With the patient's consent, PT will use one gloved finger to gently assess the muscles of the pelvic floor, seeing how well it contracts and relaxes and if there is muscle symmetry. After, the patient will get dressed and PT and patient will discuss exam findings and plan of care. PT and patient discuss plan of care, schedule, attendance policy and HEP activities.  Manual work to the right levator ani, puborectalis, with one hand internal and other external, using pelvic motion and diaphragmatic breathing to lengthen the muscles further.    Exercises: Stretches/mobility: Piriformis stretch with pulling leg  over bil.  Z leg stretch going forward to the forward knee the lift up and reach upward bil  Frog leg stretch moving forward and back going through the restrictions Pigeon pose with hip flexor stretch using the strap bil.  Hip flexor stretch laying on foam roll and leg off the  mat  Strengthening: Pelvic floor contraction with therapist finger in the vaginal canal working on the right side contraction              11/16/22 Manual: Soft tissue mobilization: To assess for dry needling Manual work to the diaphragm, abdominals, quadratus, and lumbar paraspinals   Trigger Point Dry-Needling  Treatment instructions: Expect mild to moderate muscle soreness. S/S of pneumothorax if dry needled over a lung field, and to seek immediate medical attention should they occur. Patient verbalized understanding of these instructions and education.   Patient Consent Given: Yes Education handout provided: Previously provided Muscles treated: right psoas,bilateral rectus, lumbar multifidi, quadratus,right gluteus medius Electrical stimulation performed: No Parameters: N/A Treatment response/outcome: elogation of muscle and trigger point response   Exercises: Strengthening:all need verbal cues to engage the lower abdominals.  Dead bug with lower abdominal engagement Bird dog Supine curl-up with small ball behind back 10x Diagonal curl-up with small ball behind back 10x Supine hip circles with knees bend 10x      PATIENT EDUCATION:  11/16/2022 Education details: Access Code: [email or web address], massage of the lower abdomen Person educated: Patient Education method: Explanation, Demonstration, Tactile cues, Verbal cues, and Handouts Education comprehension: verbalized understanding, returned demonstration, verbal cues required, tactile cues required, and needs further education   HOME EXERCISE PROGRAM: 11/16/2022 Access Code: [email or web address] URL: [email or web address] Date: 11/16/2022 Prepared by:  Eulis Foster   Program Notes massaging the lower abdominals   Exercises - Supine Diaphragmatic Breathing  - 1 x daily - 7 x weekly - 1 sets - 10 reps - Seated Diaphragmatic Breathing  - 2 x daily - 7 x weekly - 1 sets - 10 reps - Seated Lean Back on Swiss Ball  - 1 x daily - 7 x weekly - 1 sets - 10 reps - Dead Bug  - 1 x daily - 3 x weekly - 2 sets - 10 reps - Bird Dog  - 1 x daily - 3 x weekly - 2 sets - 10 reps - Curl Up on Mini Swiss Ball  - 1 x daily - 3 x weekly - 3 sets - 10 reps - Oblique Curl Up on Mini Swiss Ball  - 1 x daily - 3 x weekly - 3 sets - 10 reps   Patient Education - Trigger Point Dry Needling   ASSESSMENT:   CLINICAL IMPRESSION: Patient is a 42 y.o. female who was seen today for physical therapy  treatment for pelvic floor dysfunction.  Patient is not having the daily spasms with bladder filling only when the bladder is at it fullest and has to wait to go to the bathroom. The right lower quadrant pain is not as frequent and does not last as long. She does have restrictions on the right side of the bladder and superiorly. She is engaging her lower abdominals correcty. Patient will benefit from skilled therapy to improve pelvic floor coordination and reduce trigger points to reduce pain.    OBJECTIVE IMPAIRMENTS: decreased activity tolerance, decreased coordination, decreased endurance, decreased strength, increased fascial restrictions, increased muscle spasms, impaired tone, and pain.    ACTIVITY LIMITATIONS: carrying, lifting, bending, sitting, standing, sleeping, transfers, continence, and toileting   PARTICIPATION LIMITATIONS: meal prep, cleaning, laundry, interpersonal relationship, shopping, community activity, and occupation   PERSONAL FACTORS: Past/current experiences and 1-2 comorbidities: Endometriosis; IBS, labioplasty, cholecystectomy, laparoscopy with lysis of adhesions and possible endometriosis; Hysterectomy  are also affecting patient's functional  outcome.    REHAB POTENTIAL: Excellent   CLINICAL  DECISION MAKING: Stable/uncomplicated   EVALUATION COMPLEXITY: Low     GOALS: Goals reviewed with patient? Yes   SHORT TERM GOALS: Target date: 08/29/2022   Patient is independent with initial HEP for hip stretches and bulging of the pelvic floor.  Baseline: Goal status: Met 09/05/22   2.  Patient is able to equally contract the upper and lower abdomen.  Baseline:  Goal status: Met 09/05/22   3.  Patient reports her pain is 25% decreased due to less trigger points in the abdomen and pelvic floor.  Baseline:  Goal status: Met 09/26/22   4.  Patient is able to bulge her pelvic floor to relax.  Baseline:  Goal status: Met 09/05/2022     LONG TERM GOALS: Target date: 10/23/3032   Patient independent with advanced HEP for core and hips to reduce her pain.  Baseline:  Goal status: ongoing 11/16/22   2.  Patient reports her right lower quadrant pain decreased </= 0-1/10 due to improved muscle lengthening and less trigger points.  Baseline: 3/10 not as many intense flare-ups Goal status: ongoing 11/16/22   3.  Patient is able to exercise correctly in the gym with reduction of bulging of the lower abdomen and less strain on the pelvic floor.  Baseline:  Goal status: Met 11/16/22   4.  Patient able to have penile penetration with 0-1 pain level due to decreased trigger points in the pelvic floor.  Baseline:  Goal status: met 11/16/22   5.  Patient able to sneeze and cough without urinary leakage due to improve pelvic floor coordination.  Baseline: occasionally Goal status: ongoing 11/16/22   PLAN:   PT FREQUENCY: 1x/week   PT DURATION: 12 weeks   PLANNED INTERVENTIONS: Therapeutic exercises, Therapeutic activity, Neuromuscular re-education, Patient/Family education, Joint mobilization, Dry Needling, Electrical stimulation, Cryotherapy, Moist heat, Taping, Biofeedback, and Manual therapy   PLAN FOR NEXT SESSION:  dry needle if  needed, work internally on the right side of bladder and ovary  Eulis Foster, PT 11/30/22 4:15 PM'

## 2022-12-07 ENCOUNTER — Ambulatory Visit: Payer: Managed Care, Other (non HMO) | Admitting: Physical Therapy

## 2022-12-07 ENCOUNTER — Encounter: Payer: Self-pay | Admitting: Physical Therapy

## 2022-12-07 DIAGNOSIS — R279 Unspecified lack of coordination: Secondary | ICD-10-CM

## 2022-12-07 DIAGNOSIS — R252 Cramp and spasm: Secondary | ICD-10-CM

## 2022-12-07 DIAGNOSIS — M6281 Muscle weakness (generalized): Secondary | ICD-10-CM

## 2022-12-07 NOTE — Therapy (Signed)
OUTPATIENT PHYSICAL THERAPY TREATMENT NOTE   Patient Name: [name] MRN: [id number] DOB:April 12, 1981, 42 y.o., female Today's Date: 12/07/2022  PCP: Sandford Craze, NP  REFERRING PROVIDER: Jerene Bears, MD   END OF SESSION:   PT End of Session - 12/07/22 1620     Visit Number 11    Date for PT Re-Evaluation 01/16/23    Authorization Type [employer]    PT Start Time 1615    PT Stop Time 1655    PT Time Calculation (min) 40 min    Activity Tolerance Patient tolerated treatment well    Behavior During Therapy [facility] for tasks assessed/performed             Past Medical History:  Diagnosis Date   Abnormal Pap smear of cervix    2008 or 2009   Anxiety    Dental bridge present    lower front   Dry eyes    Endometriosis    Headache    Migraines   History of indigestion    treat with OTC   Hypoglycemia    Irritable bowel syndrome (IBS)    Seasonal allergies    Trigger thumb of right hand 11/2013   Past Surgical History:  Procedure Laterality Date   CHOLECYSTECTOMY  06/2015   CHROMOPERTUBATION N/A 05/30/2016   Procedure: CHROMOPERTUBATION;  Surgeon: Jerene Bears, MD;  Location: [facility];  Service: Gynecology;  Laterality: N/A;   COLPOSCOPY  2008   CYSTOSCOPY N/A 08/21/2017   Procedure: CYSTOSCOPY;  Surgeon: Jerene Bears, MD;  Location: [facility];  Service: Gynecology;  Laterality: N/A;   LABIOPLASTY N/A 08/21/2017   Procedure: LABIAPLASTY;  Surgeon: Jerene Bears, MD;  Location: [facility];  Service: Gynecology;  Laterality: N/A;   LAPAROSCOPY N/A 05/30/2016   Procedure: LAPAROSCOPY OPERATIVE WITH LYSIS OF ADHESIONS, EXCISION OF POSSIBLE ENDOMETRIOSIS;  Surgeon: Jerene Bears, MD;  Location: [facility];  Service: Gynecology;  Laterality: N/A;   REFRACTIVE SURGERY Bilateral 02/2015   TONSILLECTOMY  1988   TOTAL LAPAROSCOPIC HYSTERECTOMY WITH SALPINGECTOMY Bilateral 08/21/2017   Procedure: TOTAL LAPAROSCOPIC HYSTERECTOMY WITH SALPINGECTOMY;  Surgeon: Jerene Bears, MD;   Location: [facility];  Service: Gynecology;  Laterality: Bilateral;   TRIGGER FINGER RELEASE Right 12/23/2013   Procedure: RIGHT THUMB TRIGGER RELEASE ;  Surgeon: Tami Ribas, MD;  Location: [facility];  Service: Orthopedics;  Laterality: Right;   Patient Active Problem List   Diagnosis Date Noted   COVID-19 virus infection 09/21/2022   Palpitations 09/21/2022   Seasonal allergies 04/21/2022   Atypical chest pain 04/21/2022   Preventative health care 04/16/2021   Migraine with aura and without status migrainosus, not intractable 12/01/2020   Urinary urgency 12/01/2020   Increased risk of breast cancer 12/01/2020   H/O: hysterectomy 11/27/2019   Hyperlipidemia    Endometriosis 05/01/2017   Spasm of bowel 01/29/2016   IBS (irritable bowel syndrome) 01/29/2016   REFERRING DIAG: M62.89 (ICD-10-CM) - Pelvic floor dysfunction    THERAPY DIAG:  R27.9 Unspecified lack of coordination; M62.81 Muscle weakness( generalized) R25.2 Cramp and spasm   Rationale for Evaluation and Treatment: Rehabilitation   ONSET DATE: 2018   SUBJECTIVE:  SUBJECTIVE STATEMENT: I did well until yesterday when I did yoga and had some pain. Overall pain is better and is less frequent. Patient is not having the average spasms when bladder is full, she will only feel the spasms when her bladder is full to capacity and not able to go to the bathroom.       PAIN:  Are you having pain? Yes NPRS scale: 3/10 Pain location: Right lower quadrant   Pain type: dull Pain description: intermittent    Aggravating factors: touching the area Relieving factors: ibuprofen, heating pad   PAIN:  Are you having pain? Yes: NPRS scale: 3/10 Pain location: bladder Pain description: full bladder will feel pressure, no  spasms Aggravating factors: full bladder, alcohol  Relieving factors: urinate     PRECAUTIONS: None   WEIGHT BEARING RESTRICTIONS: No   FALLS:  Has patient fallen in last 6 months? No   LIVING ENVIRONMENT: Lives with: lives with their family   OCCUPATION: surgical tech in operating room , workouts include yoga, weights, and cardio   PLOF: Independent   PATIENT GOALS: work on right lower quadrant pain   PERTINENT HISTORY:  Endometriosis; IBS, labioplasty, cholecystectomy, laparoscopy with lysis of adhesions and possible endometriosis; Hysterectomy Sexual abuse: No   BOWEL MOVEMENT: Pain with bowel movement: No   URINATION: Pain with urination: Yes sometimes Fully empty bladder: Yes: sometimes has to go a second time to fully empty bladder Stream: Weak compared to before her hysterectomy Urgency: Yes:   Frequency: sometimes every hour,  Leakage: Coughing and Sneezing Pads: No   INTERCOURSE: Pain with intercourse: During Penetration, deep toward the right side Ability to have vaginal penetration:  Yes:   Climax: struggle Marinoff Scale: 2/3     PROLAPSE: None     OBJECTIVE:    DIAGNOSTIC FINDINGS:  Endo confirmed     COGNITION: Overall cognitive status: Within functional limits for tasks assessed                          SENSATION: Light touch: Appears intact Proprioception: Appears intact       POSTURE: decreased lumbar lordosis   PELVIC ALIGNMENT: bilateral ASIS are equal   LUMBARAROM/PROM:   A/PROM A/PROM  eval  Extension Decreased by 25%  Left rotation Decreased by 25%   (Blank rows = not tested)   LOWER EXTREMITY ROM: full bilateral hip ROM     LOWER EXTREMITY MMT:   MMT Right eval Left eval Right/left 11/16/22  Hip extension 5/5 4/5 5/5  Hip abduction 4/5 3+/5 5/5    PALPATION:   General  right abdominal has trigger points, increased tenderness located in the right lower abdomen, contraction of abdominals and bulges the lower  abdomen, tenderness at the quadratus by the ribs left worse than right.                  External Perineal Exam intact and good coloring                             Internal Pelvic Floor tenderness located in the right obturator internist, along the right side of the urethra and bladder.    Patient confirms identification and approves PT to assess internal pelvic floor and treatment Yes   PELVIC MMT:   MMT eval 09/05/22 10/14/22 11/23/22  Vaginal 4/5 posterior and 3/5 anterior 4/5 with good hug of therapist finger 4/5 4/5  with increased contraction on the right        TONE: increased   PROLAPSE: none   TODAY'S TREATMENT:  12/07/22 Manual: Myofascial release: Fascial release along  the right lower quadrant in left sidely with right leg extended and ER to open the area up.  Left sidely with therapist releasing the uterus, bladder and uterosacral ligament with other hand on the right SI joint Exercises: Stretches/mobility: Prone with ball in right lower quadrant to massage the area then flex right knee to get more of a stretch then hold at tender point for 90 seconds Quadruped with right knee on yoga block and move forward and backward the side to side Right foot on the step and bring left hand to outside right knee to open up the right SI joint Strengthening: Sitting with yoga block between knees and move hips into internal rotation 15 x Quadruped with right knee on yoga block and lift left hip working the multifidi Prone frog leg lift 10x    11/30/22 Manual: Myofascial release: Fascial release along the right side of the fundus in left sidely going through the restricitons Internal pelvic floor techniques: No emotional/communication barriers or cognitive limitation. Patient is motivated to learn. Patient understands and agrees with treatment goals and plan. PT explains patient will be examined in standing, sitting, and lying down to see how their muscles and joints work. When they  are ready, they will be asked to remove their underwear so PT can examine their perineum. The patient is also given the option of providing their own chaperone as one is not provided in our facility. The patient also has the right and is explained the right to defer or refuse any part of the evaluation or treatment including the internal exam. With the patient's consent, PT will use one gloved finger to gently assess the muscles of the pelvic floor, seeing how well it contracts and relaxes and if there is muscle symmetry. After, the patient will get dressed and PT and patient will discuss exam findings and plan of care. PT and patient discuss plan of care, schedule, attendance policy and HEP activities.  Going through the vagina and other hand externally working on the right side of the lower abdominal, releasing restrictions on the right ovary, release superior bladder and manual work to the right psoas in supine Trigger Point Dry-Needling  Treatment instructions: Expect mild to moderate muscle soreness. S/S of pneumothorax if dry needled over a lung field, and to seek immediate medical attention should they occur. Patient verbalized understanding of these instructions and education.   Patient Consent Given: Yes Education handout provided: Previously provided Muscles treated: right psoas but too tender and fascia to tight for needle to go all the way in Electrical stimulation performed: No Parameters: N/A Treatment response/outcome: pain therefore did not finish     11/23/22 Manual: Internal pelvic floor techniques: No emotional/communication barriers or cognitive limitation. Patient is motivated to learn. Patient understands and agrees with treatment goals and plan. PT explains patient will be examined in standing, sitting, and lying down to see how their muscles and joints work. When they are ready, they will be asked to remove their underwear so PT can examine their perineum. The patient is also  given the option of providing their own chaperone as one is not provided in our facility. The patient also has the right and is explained the right to defer or refuse any part of the evaluation or treatment including the internal exam. With  the patient's consent, PT will use one gloved finger to gently assess the muscles of the pelvic floor, seeing how well it contracts and relaxes and if there is muscle symmetry. After, the patient will get dressed and PT and patient will discuss exam findings and plan of care. PT and patient discuss plan of care, schedule, attendance policy and HEP activities.  Manual work to the right levator ani, puborectalis, with one hand internal and other external, using pelvic motion and diaphragmatic breathing to lengthen the muscles further.    Exercises: Stretches/mobility: Piriformis stretch with pulling leg over bil.  Z leg stretch going forward to the forward knee the lift up and reach upward bil  Frog leg stretch moving forward and back going through the restrictions Pigeon pose with hip flexor stretch using the strap bil.  Hip flexor stretch laying on foam roll and leg off the mat  Strengthening: Pelvic floor contraction with therapist finger in the vaginal canal working on the right side contraction     PATIENT EDUCATION:  11/16/2022 Education details: Access Code: [email or web address], massage of the lower abdomen Person educated: Patient Education method: Explanation, Demonstration, Tactile cues, Verbal cues, and Handouts Education comprehension: verbalized understanding, returned demonstration, verbal cues required, tactile cues required, and needs further education   HOME EXERCISE PROGRAM: 11/16/2022 Access Code: [email or web address] URL: [email or web address] Date: 11/16/2022 Prepared by: Eulis Foster   Program Notes massaging the lower abdominals   Exercises - Supine Diaphragmatic Breathing  - 1 x daily - 7 x weekly - 1 sets - 10 reps - Seated  Diaphragmatic Breathing  - 2 x daily - 7 x weekly - 1 sets - 10 reps - Seated Lean Back on Swiss Ball  - 1 x daily - 7 x weekly - 1 sets - 10 reps - Dead Bug  - 1 x daily - 3 x weekly - 2 sets - 10 reps - Bird Dog  - 1 x daily - 3 x weekly - 2 sets - 10 reps - Curl Up on Mini Swiss Ball  - 1 x daily - 3 x weekly - 3 sets - 10 reps - Oblique Curl Up on Mini Swiss Ball  - 1 x daily - 3 x weekly - 3 sets - 10 reps   Patient Education - Trigger Point Dry Needling   ASSESSMENT:   CLINICAL IMPRESSION: Patient is a 42 y.o. female who was seen today for physical therapy  treatment for pelvic floor dysfunction.  Patient had not pain after therapy session. She has improved movement of the right SI joint. Patient will benefit from skilled therapy to improve pelvic floor coordination and reduce trigger points to reduce pain.    OBJECTIVE IMPAIRMENTS: decreased activity tolerance, decreased coordination, decreased endurance, decreased strength, increased fascial restrictions, increased muscle spasms, impaired tone, and pain.    ACTIVITY LIMITATIONS: carrying, lifting, bending, sitting, standing, sleeping, transfers, continence, and toileting   PARTICIPATION LIMITATIONS: meal prep, cleaning, laundry, interpersonal relationship, shopping, community activity, and occupation   PERSONAL FACTORS: Past/current experiences and 1-2 comorbidities: Endometriosis; IBS, labioplasty, cholecystectomy, laparoscopy with lysis of adhesions and possible endometriosis; Hysterectomy  are also affecting patient's functional outcome.    REHAB POTENTIAL: Excellent   CLINICAL DECISION MAKING: Stable/uncomplicated   EVALUATION COMPLEXITY: Low     GOALS: Goals reviewed with patient? Yes   SHORT TERM GOALS: Target date: 08/29/2022   Patient is independent with initial HEP for hip stretches and bulging of the pelvic floor.  Baseline: Goal status: Met 09/05/22  2.  Patient is able to equally contract the upper and lower  abdomen.  Baseline:  Goal status: Met 09/05/22   3.  Patient reports her pain is 25% decreased due to less trigger points in the abdomen and pelvic floor.  Baseline:  Goal status: Met 09/26/22   4.  Patient is able to bulge her pelvic floor to relax.  Baseline:  Goal status: Met 09/05/2022     LONG TERM GOALS: Target date: 10/23/3032   Patient independent with advanced HEP for core and hips to reduce her pain.  Baseline:  Goal status: ongoing 11/16/22   2.  Patient reports her right lower quadrant pain decreased </= 0-1/10 due to improved muscle lengthening and less trigger points.  Baseline: 3/10 not as many intense flare-ups Goal status: ongoing 11/16/22   3.  Patient is able to exercise correctly in the gym with reduction of bulging of the lower abdomen and less strain on the pelvic floor.  Baseline:  Goal status: Met 11/16/22   4.  Patient able to have penile penetration with 0-1 pain level due to decreased trigger points in the pelvic floor.  Baseline:  Goal status: met 11/16/22   5.  Patient able to sneeze and cough without urinary leakage due to improve pelvic floor coordination.  Baseline: occasionally Goal status: ongoing 11/16/22   PLAN:   PT FREQUENCY: 1x/week   PT DURATION: 12 weeks   PLANNED INTERVENTIONS: Therapeutic exercises, Therapeutic activity, Neuromuscular re-education, Patient/Family education, Joint mobilization, Dry Needling, Electrical stimulation, Cryotherapy, Moist heat, Taping, Biofeedback, and Manual therapy   PLAN FOR NEXT SESSION:  dry needle if needed, work internally on the right side of bladder and ovary; up date HEP with the new exercises given today.    Eulis Foster, PT 12/07/22 4:22 PM

## 2022-12-08 ENCOUNTER — Encounter (HOSPITAL_BASED_OUTPATIENT_CLINIC_OR_DEPARTMENT_OTHER): Payer: Self-pay | Admitting: Obstetrics & Gynecology

## 2022-12-08 ENCOUNTER — Ambulatory Visit (HOSPITAL_BASED_OUTPATIENT_CLINIC_OR_DEPARTMENT_OTHER): Payer: Managed Care, Other (non HMO) | Admitting: Obstetrics & Gynecology

## 2022-12-08 VITALS — BP 117/79 | HR 81 | Ht 61.0 in | Wt 113.4 lb

## 2022-12-08 DIAGNOSIS — R6882 Decreased libido: Secondary | ICD-10-CM

## 2022-12-08 DIAGNOSIS — Z90710 Acquired absence of both cervix and uterus: Secondary | ICD-10-CM

## 2022-12-08 DIAGNOSIS — Z9189 Other specified personal risk factors, not elsewhere classified: Secondary | ICD-10-CM

## 2022-12-08 DIAGNOSIS — B009 Herpesviral infection, unspecified: Secondary | ICD-10-CM

## 2022-12-08 DIAGNOSIS — R7989 Other specified abnormal findings of blood chemistry: Secondary | ICD-10-CM

## 2022-12-08 DIAGNOSIS — Z01419 Encounter for gynecological examination (general) (routine) without abnormal findings: Secondary | ICD-10-CM

## 2022-12-08 DIAGNOSIS — N9089 Other specified noninflammatory disorders of vulva and perineum: Secondary | ICD-10-CM

## 2022-12-08 MED ORDER — NONFORMULARY OR COMPOUNDED ITEM: 1 refills | Status: DC

## 2022-12-08 MED ORDER — VALACYCLOVIR HCL 1 G PO TABS: 1000.0000 mg | ORAL_TABLET | Freq: Every day | ORAL | 4 refills | Status: DC

## 2022-12-08 NOTE — Progress Notes (Signed)
42 y.o. G0P0000 Married White or Caucasian female here for annual exam.  Going to [facility].  Ferritin elevated.  Testosterone level was low. Has been using topical testosterone but not very regularly.  Needs refill.    Denies vaginal bleeding.    Patient's last menstrual period was 07/31/2017.          Sexually active: Yes.    The current method of family planning is status post hysterectomy.    Exercising: Yes.     Smoker:  no  Health Maintenance: Pap:  not indicated History of abnormal Pap:  no MMG:  pt reports done 09/2022 Colonoscopy:  guidelines reviewed Screening Labs: reviewed lab work she brought with her.   reports that she quit smoking about 12 years ago. Her smoking use included cigarettes. She has never used smokeless tobacco. She reports current alcohol use of about 2.0 standard drinks of alcohol per week. She reports that she does not use drugs.  Past Medical History:  Diagnosis Date   Abnormal Pap smear of cervix    2008 or 2009   Anxiety    Dental bridge present    lower front   Dry eyes    Endometriosis    Headache    Migraines   History of indigestion    treat with OTC   Hypoglycemia    Irritable bowel syndrome (IBS)    Seasonal allergies    Trigger thumb of right hand 11/2013    Past Surgical History:  Procedure Laterality Date   CHOLECYSTECTOMY  06/2015   CHROMOPERTUBATION N/A 05/30/2016   Procedure: CHROMOPERTUBATION;  Surgeon: Jerene Bears, MD;  Location: [facility];  Service: Gynecology;  Laterality: N/A;   COLPOSCOPY  2008   CYSTOSCOPY N/A 08/21/2017   Procedure: CYSTOSCOPY;  Surgeon: Jerene Bears, MD;  Location: [facility];  Service: Gynecology;  Laterality: N/A;   LABIOPLASTY N/A 08/21/2017   Procedure: LABIAPLASTY;  Surgeon: Jerene Bears, MD;  Location: [facility];  Service: Gynecology;  Laterality: N/A;   LAPAROSCOPY N/A 05/30/2016   Procedure: LAPAROSCOPY OPERATIVE WITH LYSIS OF ADHESIONS, EXCISION OF POSSIBLE ENDOMETRIOSIS;   Surgeon: Jerene Bears, MD;  Location: [facility];  Service: Gynecology;  Laterality: N/A;   REFRACTIVE SURGERY Bilateral 02/2015   TONSILLECTOMY  1988   TOTAL LAPAROSCOPIC HYSTERECTOMY WITH SALPINGECTOMY Bilateral 08/21/2017   Procedure: TOTAL LAPAROSCOPIC HYSTERECTOMY WITH SALPINGECTOMY;  Surgeon: Jerene Bears, MD;  Location: [facility];  Service: Gynecology;  Laterality: Bilateral;   TRIGGER FINGER RELEASE Right 12/23/2013   Procedure: RIGHT THUMB TRIGGER RELEASE ;  Surgeon: Tami Ribas, MD;  Location: [facility];  Service: Orthopedics;  Laterality: Right;    Current Outpatient Medications  Medication Sig Dispense Refill   Carboxymethylcellulose Sod PF 0.5 % SOLN Place 1 drop into both eyes 2 (two) times daily.     Cholecalciferol (VITAMIN D3) 2000 units TABS Take 2,000 Units by mouth at bedtime.     clobetasol ointment (TEMOVATE) 0.05 % Apply 1 application. topically 2 (two) times daily. Apply as directed twice daily.  Stop after two weeks. 60 g 0   eletriptan (RELPAX) 40 MG tablet Take 1 tablet (40 mg total) by mouth one time as needed for headaches. 30 tablet 1   ibuprofen (ADVIL) 800 MG tablet Take 1 tablet (800 mg total) by mouth every 8 (eight) hours as needed. 30 tablet 1   KRILL OIL PO Take by mouth.     MELATONIN ER PO Take by mouth.  methocarbamol (ROBAXIN) 500 MG tablet Take 1 tablet (500 mg total) by mouth every 6 (six) hours as needed for muscle spasms. 30 tablet 5   mometasone (ELOCON) 0.1 % ointment Apply topically twice weekly 45 g 3   montelukast (SINGULAIR) 10 MG tablet Take 1 tablet (10 mg total) by mouth at bedtime. 90 tablet 3   Multiple Vitamin (MULTIVITAMIN WITH MINERALS) TABS tablet Take 1 tablet by mouth at bedtime.     ondansetron (ZOFRAN ODT) 4 MG disintegrating tablet Take 1 tablet (4 mg total) by mouth every 8 (eight) hours as needed for nausea or vomiting. 20 tablet 5   Simethicone 125 MG CAPS Take 125 mg by mouth at bedtime.     NONFORMULARY OR  COMPOUNDED ITEM Testosterone 1mg /0.54ml topical cream.  Apply 0.50ml three times weekly to thighs. Disp: 90 day supply. 1 each 1   valACYclovir (VALTREX) 1000 MG tablet Take 1 tablet (1,000 mg total) by mouth daily. 90 tablet 4   No current facility-administered medications for this visit.    Family History  Problem Relation Age of Onset   Breast cancer Maternal Grandmother 28       died early 76's   Hyperlipidemia Mother    Heart disease Father        valve replacement, aortic aneurysm   Cancer Paternal Grandfather        Jaw cancer, smoker    ROS: Constitutional: negative Genitourinary:negative  Exam:   BP 117/79 (BP Location: Right Arm, Patient Position: Sitting, Cuff Size: Normal)   Pulse 81   Ht 5\' 1"  (1.549 m) Comment: Reported  Wt 113 lb 6.4 oz (51.4 kg)   LMP 07/31/2017   BMI 21.43 kg/m   Height: 5\' 1"  (154.9 cm) (Reported)  General appearance: alert, cooperative and appears stated age Head: Normocephalic, without obvious abnormality, atraumatic Neck: no adenopathy, supple, symmetrical, trachea midline and thyroid normal to inspection and palpation Lungs: clear to auscultation bilaterally Breasts: normal appearance, no masses or tenderness Heart: regular rate and rhythm Abdomen: soft, non-tender; bowel sounds normal; no masses,  no organomegaly Extremities: extremities normal, atraumatic, no cyanosis or edema Skin: Skin color, texture, turgor normal. No rashes or lesions Lymph nodes: Cervical, supraclavicular, and axillary nodes normal. No abnormal inguinal nodes palpated Neurologic: Grossly normal   Pelvic: External genitalia:  no lesions              Urethra:  normal appearing urethra with no masses, tenderness or lesions              Bartholins and Skenes: normal                 Vagina: normal appearing vagina with normal color and no discharge, no lesions              Cervix: absent              Pap taken: No. Bimanual Exam:  Uterus:  uterus absent               Adnexa: no mass, fullness, tenderness               Rectovaginal: Confirms               Anus:  normal sphincter tone, no lesions  Chaperone, Ina Homes, CMA, was present for exam.  Assessment/Plan: 1. Well woman exam with routine gynecological exam - Pap smear not indicated - Mammogram done 09/2022 - Colonoscopy guidelines reviewed - Bone mineral density will be  planned after age 4 - lab work done - vaccines reviewed/updated   2. Decreased libido - NONFORMULARY OR COMPOUNDED ITEM; Testosterone /0.62ml topical cream.  Apply 0.78ml three times weekly to thighs. Disp: 90 day supply.  Dispense: 1 each; Refill: 1  3. HSV-2 (herpes simplex virus 2) infection - valACYclovir (VALTREX) 1000 MG tablet; Take 1 tablet (1,000 mg total) by mouth daily.  Dispense: 90 tablet; Refill: 4  4. H/O: hysterectomy  5. Increased risk of breast cancer - pt did MRI in 2022.  Will consider but not ready to repeat right now  6. Vulvar irritation - using topical clobetsasol ointment.  Will switch to mometasone  7.  Elevated ferritin - will recheck today with iron.  Have reached out to hematology for input.  She will get me hemochromatosis testing to review.

## 2022-12-09 ENCOUNTER — Telehealth (HOSPITAL_BASED_OUTPATIENT_CLINIC_OR_DEPARTMENT_OTHER): Payer: Self-pay | Admitting: Obstetrics & Gynecology

## 2022-12-09 LAB — IRON,TIBC AND FERRITIN PANEL
Ferritin: 287 ng/mL — ABNORMAL HIGH (ref 15–150)
Iron Saturation: 22 % (ref 15–55)
Iron: 73 ug/dL (ref 27–159)
Total Iron Binding Capacity: 330 ug/dL (ref 250–450)
UIBC: 257 ug/dL (ref 131–425)

## 2022-12-10 ENCOUNTER — Other Ambulatory Visit: Payer: Self-pay | Admitting: Family

## 2022-12-10 NOTE — Telephone Encounter (Signed)
Opened in error

## 2022-12-14 ENCOUNTER — Encounter: Payer: Self-pay | Admitting: Physical Therapy

## 2022-12-14 ENCOUNTER — Ambulatory Visit: Payer: Managed Care, Other (non HMO) | Admitting: Physical Therapy

## 2022-12-14 DIAGNOSIS — R252 Cramp and spasm: Secondary | ICD-10-CM

## 2022-12-14 DIAGNOSIS — R279 Unspecified lack of coordination: Secondary | ICD-10-CM

## 2022-12-14 DIAGNOSIS — M6281 Muscle weakness (generalized): Secondary | ICD-10-CM

## 2022-12-14 NOTE — Therapy (Signed)
OUTPATIENT PHYSICAL THERAPY TREATMENT NOTE   Patient Name: [name] MRN: [id number] DOB:09-Dec-1980, 42 y.o., female Today's Date: 12/14/2022  PCP: Sandford Craze, NP  REFERRING PROVIDER:  Jerene Bears, MD   END OF SESSION:   PT End of Session - 12/14/22 1614     Visit Number 12    Date for PT Re-Evaluation 01/16/23    Authorization Type [employer]    PT Start Time 1615    PT Stop Time 1655    PT Time Calculation (min) 40 min    Activity Tolerance Patient tolerated treatment well    Behavior During Therapy [facility] for tasks assessed/performed             Past Medical History:  Diagnosis Date   Abnormal Pap smear of cervix    2008 or 2009   Anxiety    Dental bridge present    lower front   Dry eyes    Endometriosis    Headache    Migraines   History of indigestion    treat with OTC   Hypoglycemia    Irritable bowel syndrome (IBS)    Seasonal allergies    Trigger thumb of right hand 11/2013   Past Surgical History:  Procedure Laterality Date   CHOLECYSTECTOMY  06/2015   CHROMOPERTUBATION N/A 05/30/2016   Procedure: CHROMOPERTUBATION;  Surgeon: Jerene Bears, MD;  Location: [facility];  Service: Gynecology;  Laterality: N/A;   COLPOSCOPY  2008   CYSTOSCOPY N/A 08/21/2017   Procedure: CYSTOSCOPY;  Surgeon: Jerene Bears, MD;  Location: [facility];  Service: Gynecology;  Laterality: N/A;   LABIOPLASTY N/A 08/21/2017   Procedure: LABIAPLASTY;  Surgeon: Jerene Bears, MD;  Location: [facility];  Service: Gynecology;  Laterality: N/A;   LAPAROSCOPY N/A 05/30/2016   Procedure: LAPAROSCOPY OPERATIVE WITH LYSIS OF ADHESIONS, EXCISION OF POSSIBLE ENDOMETRIOSIS;  Surgeon: Jerene Bears, MD;  Location: [facility];  Service: Gynecology;  Laterality: N/A;   REFRACTIVE SURGERY Bilateral 02/2015   TONSILLECTOMY  1988   TOTAL LAPAROSCOPIC HYSTERECTOMY WITH SALPINGECTOMY Bilateral 08/21/2017   Procedure: TOTAL LAPAROSCOPIC HYSTERECTOMY WITH SALPINGECTOMY;  Surgeon: Jerene Bears, MD;   Location: [facility];  Service: Gynecology;  Laterality: Bilateral;   TRIGGER FINGER RELEASE Right 12/23/2013   Procedure: RIGHT THUMB TRIGGER RELEASE ;  Surgeon: Tami Ribas, MD;  Location: [facility];  Service: Orthopedics;  Laterality: Right;   Patient Active Problem List   Diagnosis Date Noted   COVID-19 virus infection 09/21/2022   Palpitations 09/21/2022   Seasonal allergies 04/21/2022   Atypical chest pain 04/21/2022   Preventative health care 04/16/2021   Migraine with aura and without status migrainosus, not intractable 12/01/2020   Urinary urgency 12/01/2020   Increased risk of breast cancer 12/01/2020   H/O: hysterectomy 11/27/2019   Hyperlipidemia    Endometriosis 05/01/2017   Spasm of bowel 01/29/2016   IBS (irritable bowel syndrome) 01/29/2016   REFERRING DIAG: M62.89 (ICD-10-CM) - Pelvic floor dysfunction    THERAPY DIAG:  R27.9 Unspecified lack of coordination; M62.81 Muscle weakness( generalized) R25.2 Cramp and spasm   Rationale for Evaluation and Treatment: Rehabilitation   ONSET DATE: 2018   SUBJECTIVE:  SUBJECTIVE STATEMENT: I coughed and did not leak. I have been feeling good since last visit.        PAIN:  Are you having pain? Yes NPRS scale: 3/10 Pain location: Right lower quadrant   Pain type: dull Pain description: intermittent    Aggravating factors: touching the area Relieving factors: ibuprofen, heating pad   PAIN:  Are you having pain? Yes: NPRS scale: 3/10 Pain location: bladder Pain description: full bladder will feel pressure, no spasms Aggravating factors: full bladder, alcohol  Relieving factors: urinate     PRECAUTIONS: None   WEIGHT BEARING RESTRICTIONS: No   FALLS:  Has patient fallen in last 6 months? No   LIVING  ENVIRONMENT: Lives with: lives with their family   OCCUPATION: surgical tech in operating room , workouts include yoga, weights, and cardio   PLOF: Independent   PATIENT GOALS: work on right lower quadrant pain   PERTINENT HISTORY:  Endometriosis; IBS, labioplasty, cholecystectomy, laparoscopy with lysis of adhesions and possible endometriosis; Hysterectomy Sexual abuse: No   BOWEL MOVEMENT: Pain with bowel movement: No   URINATION: Pain with urination: Yes sometimes Fully empty bladder: Yes: sometimes has to go a second time to fully empty bladder Stream: Weak compared to before her hysterectomy Urgency: Yes:   Frequency: sometimes every hour,  Leakage: Coughing and Sneezing Pads: No   INTERCOURSE: Pain with intercourse: During Penetration, deep toward the right side Ability to have vaginal penetration:  Yes:   Climax: struggle Marinoff Scale: 2/3     PROLAPSE: None     OBJECTIVE:    DIAGNOSTIC FINDINGS:  Endo confirmed     COGNITION: Overall cognitive status: Within functional limits for tasks assessed                          SENSATION: Light touch: Appears intact Proprioception: Appears intact       POSTURE: decreased lumbar lordosis   PELVIC ALIGNMENT: bilateral ASIS are equal   LUMBARAROM/PROM:   A/PROM A/PROM  eval  Extension Decreased by 25%  Left rotation Decreased by 25%   (Blank rows = not tested)   LOWER EXTREMITY ROM: full bilateral hip ROM     LOWER EXTREMITY MMT:   MMT Right eval Left eval Right/left 11/16/22  Hip extension 5/5 4/5 5/5  Hip abduction 4/5 3+/5 5/5    PALPATION:   General  right abdominal has trigger points, increased tenderness located in the right lower abdomen, contraction of abdominals and bulges the lower abdomen, tenderness at the quadratus by the ribs left worse than right.                  External Perineal Exam intact and good coloring                             Internal Pelvic Floor tenderness  located in the right obturator internist, along the right side of the urethra and bladder.    Patient confirms identification and approves PT to assess internal pelvic floor and treatment Yes   PELVIC MMT:   MMT eval 09/05/22 10/14/22 11/23/22  Vaginal 4/5 posterior and 3/5 anterior 4/5 with good hug of therapist finger 4/5 4/5 with increased contraction on the right        TONE: increased   PROLAPSE: none   TODAY'S TREATMENT:  12/14/22 Manual: Soft tissue mobilization:  To assess after dry needling Manual work to  rectus abdominus, right quadratus to elongate after dry needling Myofascial release: Left sidely bringing right leg into extension and abduction with fascial release to the right lower quadrant Trigger Point Dry-Needling  Treatment instructions: Expect mild to moderate muscle soreness. S/S of pneumothorax if dry needled over a lung field, and to seek immediate medical attention should they occur. Patient verbalized understanding of these instructions and education.  Patient Consent Given: Yes Education handout provided: Previously provided Muscles treated: right rectus femorous; right quadratus Electrical stimulation performed: No Parameters: N/A Treatment response/outcome: elongation of muscles and trigger point response  Exercises: Stretches/mobility: Educated patient on how to massage where she has the lichens sclerosis and using a vaginal moisturizer cream.  Prone with ball in right lower quadrant to massage the area then flex right knee to get more of a stretch then hold at tender point for 90 seconds Strengthening: Standing curtsy with right leg going behind left Standing moving tapping leg from 90 to 180 degrees Sidely hip adduction circles then 10 pulses bil.   Hip thrust with 10 # 20 times     12/07/22 Manual: Myofascial release: Fascial release along  the right lower quadrant in left sidely with right leg extended and ER to open the area up.  Left sidely  with therapist releasing the uterus, bladder and uterosacral ligament with other hand on the right SI joint Exercises: Stretches/mobility: Prone with ball in right lower quadrant to massage the area then flex right knee to get more of a stretch then hold at tender point for 90 seconds Quadruped with right knee on yoga block and move forward and backward the side to side Right foot on the step and bring left hand to outside right knee to open up the right SI joint Strengthening: Sitting with yoga block between knees and move hips into internal rotation 15 x Quadruped with right knee on yoga block and lift left hip working the multifidi Prone frog leg lift 10x     11/30/22 Manual: Myofascial release: Fascial release along the right side of the fundus in left sidely going through the restricitons Internal pelvic floor techniques: No emotional/communication barriers or cognitive limitation. Patient is motivated to learn. Patient understands and agrees with treatment goals and plan. PT explains patient will be examined in standing, sitting, and lying down to see how their muscles and joints work. When they are ready, they will be asked to remove their underwear so PT can examine their perineum. The patient is also given the option of providing their own chaperone as one is not provided in our facility. The patient also has the right and is explained the right to defer or refuse any part of the evaluation or treatment including the internal exam. With the patient's consent, PT will use one gloved finger to gently assess the muscles of the pelvic floor, seeing how well it contracts and relaxes and if there is muscle symmetry. After, the patient will get dressed and PT and patient will discuss exam findings and plan of care. PT and patient discuss plan of care, schedule, attendance policy and HEP activities.  Going through the vagina and other hand externally working on the right side of the lower  abdominal, releasing restrictions on the right ovary, release superior bladder and manual work to the right psoas in supine Trigger Point Dry-Needling  Treatment instructions: Expect mild to moderate muscle soreness. S/S of pneumothorax if dry needled over a lung field, and to seek immediate medical attention should they occur.  Patient verbalized understanding of these instructions and education.   Patient Consent Given: Yes Education handout provided: Previously provided Muscles treated: right psoas but too tender and fascia to tight for needle to go all the way in Electrical stimulation performed: No Parameters: N/A Treatment response/outcome: pain therefore did not finish      PATIENT EDUCATION:  11/16/2022 Education details: Access Code: [email or web address], massage of the lower abdomen Person educated: Patient Education method: Explanation, Demonstration, Tactile cues, Verbal cues, and Handouts Education comprehension: verbalized understanding, returned demonstration, verbal cues required, tactile cues required, and needs further education   HOME EXERCISE PROGRAM: 11/16/2022 Access Code: [email or web address] URL: [email or web address] Date: 11/16/2022 Prepared by: Eulis Foster   Program Notes massaging the lower abdominals   Exercises - Supine Diaphragmatic Breathing  - 1 x daily - 7 x weekly - 1 sets - 10 reps - Seated Diaphragmatic Breathing  - 2 x daily - 7 x weekly - 1 sets - 10 reps - Seated Lean Back on Swiss Ball  - 1 x daily - 7 x weekly - 1 sets - 10 reps - Dead Bug  - 1 x daily - 3 x weekly - 2 sets - 10 reps - Bird Dog  - 1 x daily - 3 x weekly - 2 sets - 10 reps - Curl Up on Mini Swiss Ball  - 1 x daily - 3 x weekly - 3 sets - 10 reps - Oblique Curl Up on Mini Swiss Ball  - 1 x daily - 3 x weekly - 3 sets - 10 reps   Patient Education - Trigger Point Dry Needling   ASSESSMENT:   CLINICAL IMPRESSION: Patient is a 42 y.o. female who was seen today for physical therapy   treatment for pelvic floor dysfunction.  Patient had not pain after therapy session. She has improved movement of the right SI joint. Patient will extend her right leg with slight hip abduction due to weakness in the gluteal. Patient needs tactile cues to bring leg upward for adduction in standing. Patient will benefit from skilled therapy to improve pelvic floor coordination and reduce trigger points to reduce pain.    OBJECTIVE IMPAIRMENTS: decreased activity tolerance, decreased coordination, decreased endurance, decreased strength, increased fascial restrictions, increased muscle spasms, impaired tone, and pain.    ACTIVITY LIMITATIONS: carrying, lifting, bending, sitting, standing, sleeping, transfers, continence, and toileting   PARTICIPATION LIMITATIONS: meal prep, cleaning, laundry, interpersonal relationship, shopping, community activity, and occupation   PERSONAL FACTORS: Past/current experiences and 1-2 comorbidities: Endometriosis; IBS, labioplasty, cholecystectomy, laparoscopy with lysis of adhesions and possible endometriosis; Hysterectomy  are also affecting patient's functional outcome.    REHAB POTENTIAL: Excellent   CLINICAL DECISION MAKING: Stable/uncomplicated   EVALUATION COMPLEXITY: Low     GOALS: Goals reviewed with patient? Yes   SHORT TERM GOALS: Target date: 08/29/2022   Patient is independent with initial HEP for hip stretches and bulging of the pelvic floor.  Baseline: Goal status: Met 09/05/22   2.  Patient is able to equally contract the upper and lower abdomen.  Baseline:  Goal status: Met 09/05/22   3.  Patient reports her pain is 25% decreased due to less trigger points in the abdomen and pelvic floor.  Baseline:  Goal status: Met 09/26/22   4.  Patient is able to bulge her pelvic floor to relax.  Baseline:  Goal status: Met 09/05/2022     LONG TERM GOALS: Target date: 10/23/3032   Patient independent with advanced HEP for core and  hips to reduce her  pain.  Baseline:  Goal status: ongoing 11/16/22   2.  Patient reports her right lower quadrant pain decreased </= 0-1/10 due to improved muscle lengthening and less trigger points.  Baseline: 3/10 not as many intense flare-ups Goal status: ongoing 11/16/22   3.  Patient is able to exercise correctly in the gym with reduction of bulging of the lower abdomen and less strain on the pelvic floor.  Baseline:  Goal status: Met 11/16/22   4.  Patient able to have penile penetration with 0-1 pain level due to decreased trigger points in the pelvic floor.  Baseline:  Goal status: met 11/16/22   5.  Patient able to sneeze and cough without urinary leakage due to improve pelvic floor coordination.  Baseline: occasionally Goal status: ongoing 11/16/22   PLAN:   PT FREQUENCY: 1x/week   PT DURATION: 12 weeks   PLANNED INTERVENTIONS: Therapeutic exercises, Therapeutic activity, Neuromuscular re-education, Patient/Family education, Joint mobilization, Dry Needling, Electrical stimulation, Cryotherapy, Moist heat, Taping, Biofeedback, and Manual therapy   PLAN FOR NEXT SESSION:  dry needle if needed, work internally on the right side of bladder and ovary; up date HEP with the new exercises given today.   Eulis Foster, PT 12/14/22 4:50 PM

## 2022-12-15 ENCOUNTER — Encounter (HOSPITAL_BASED_OUTPATIENT_CLINIC_OR_DEPARTMENT_OTHER): Payer: Self-pay | Admitting: *Deleted

## 2022-12-30 ENCOUNTER — Encounter: Payer: BC Managed Care – PPO | Admitting: Physical Therapy

## 2023-01-05 ENCOUNTER — Encounter (HOSPITAL_BASED_OUTPATIENT_CLINIC_OR_DEPARTMENT_OTHER): Payer: Self-pay | Admitting: *Deleted

## 2023-01-07 IMAGING — MR MR BREAST BILAT WO/W CM
8 of 12 series · 33 of 48 positions shown · IV contrast (5 ml Gadavist)
Comparison: Previous exam(s).

CLINICAL DATA: Patient for high risk screening breast MRI. Elevated
lifetime risk of breast cancer.

EXAM:
BILATERAL BREAST MRI WITH AND WITHOUT CONTRAST
TECHNIQUE: Multiplanar, multisequence MR images of both breasts were obtained
prior to and following the intravenous administration of 5 ml of
Gadavist

[Series 2: t2_tirm_tra ipat (a-p) · axial · 3.0mm · 0.62mm/px · 1 of 55 slices shown]
[im 1/55]
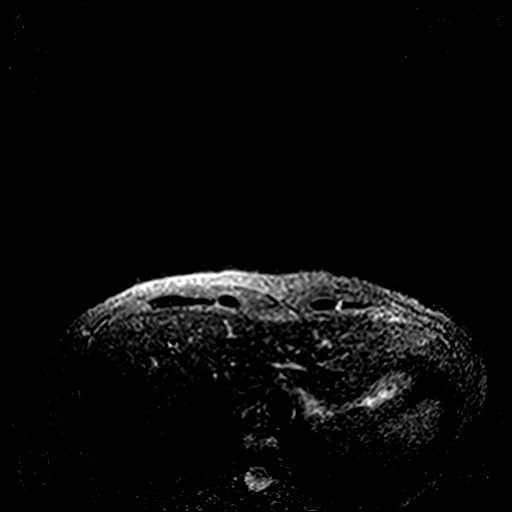

[Series 3: fl3d pre-cm no · axial · non-contrast · 1.2mm · 0.83mm/px · z∈[-132,+40]mm · 5 of 144 slices shown]
[im 1/144]
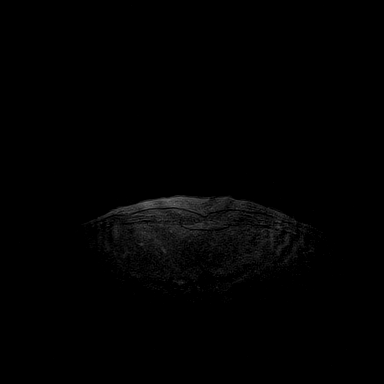
[im 36/144]
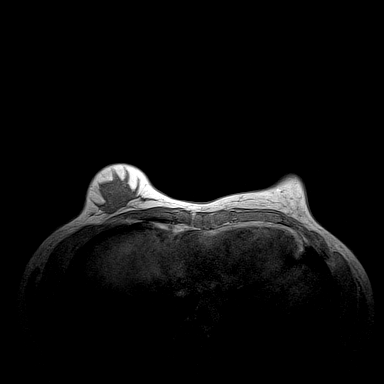
[im 72/144]
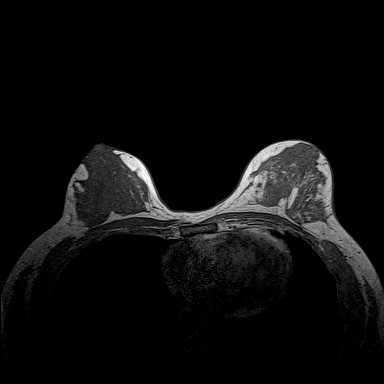
[im 108/144]
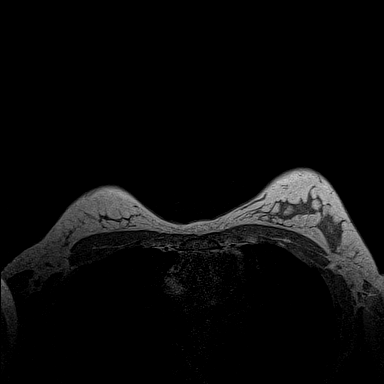
[im 144/144]
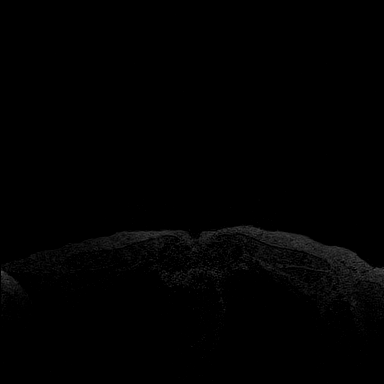

[Series 4: fl3d pre-cm · axial · non-contrast · 1.2mm · 0.83mm/px · z∈[-132,+40]mm · 5 of 144 slices shown]
[im 1/144]
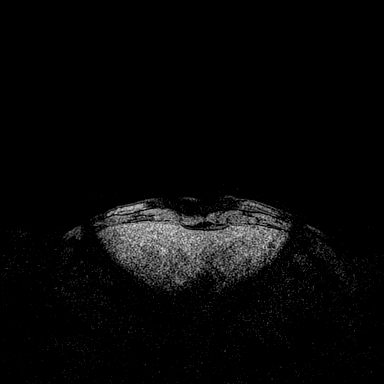
[im 36/144]
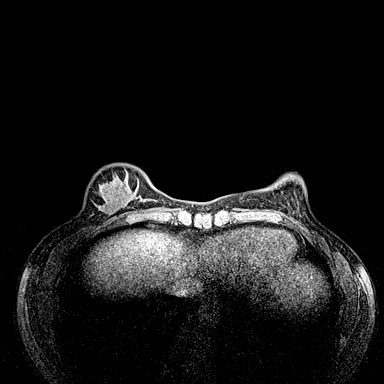
[im 72/144]
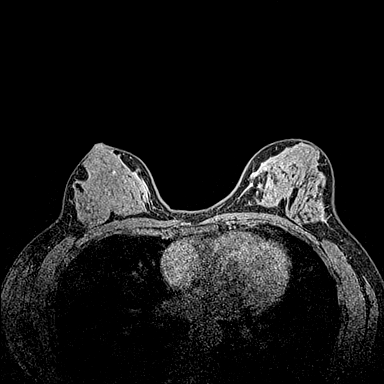
[im 108/144]
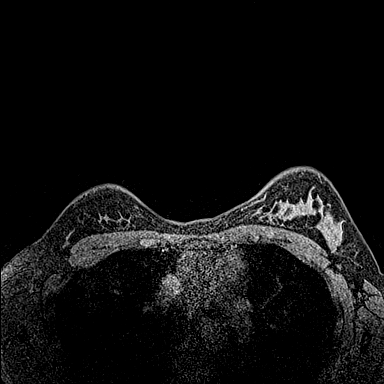
[im 144/144]
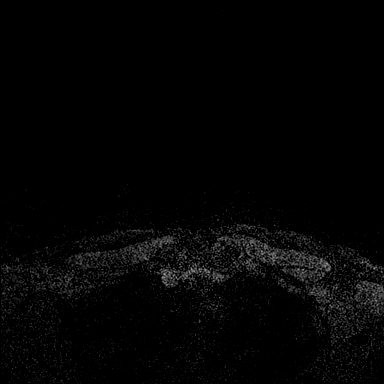

[Series 5: fl3d post-cm 20 · axial · 1.2mm · 0.83mm/px · z∈[-132,+40]mm · 5 of 144 slices shown (1 of 3)]
[im 1/144]
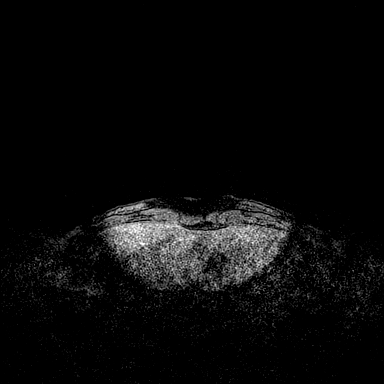
[im 36/144]
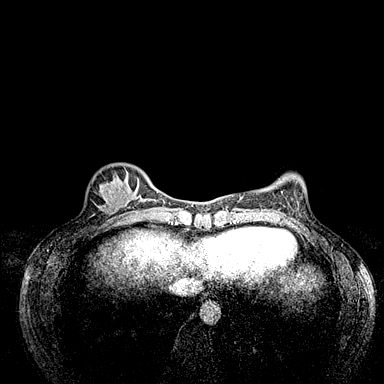
[im 72/144]
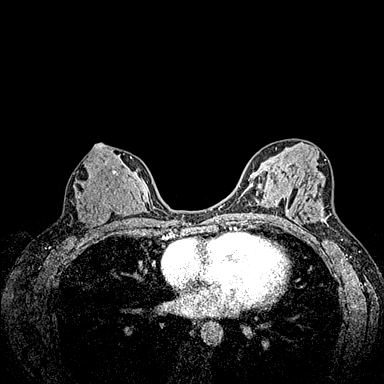
[im 108/144]
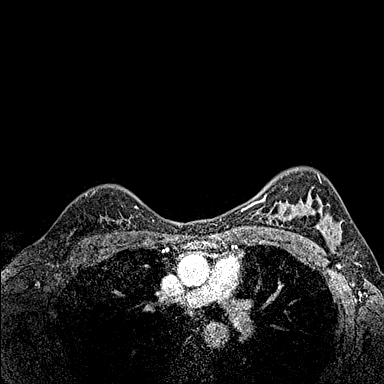
[im 144/144]
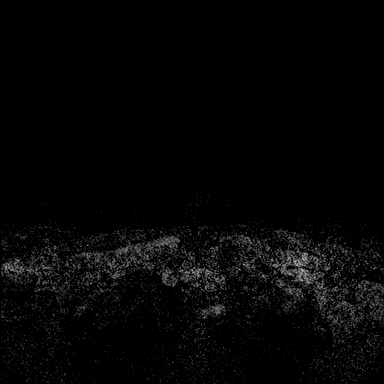

[Series 6: fl3d post-cm 20 · axial · 1.2mm · 0.83mm/px · z∈[-132,+40]mm · 5 of 144 slices shown (2 of 3)]
[im 1/144]
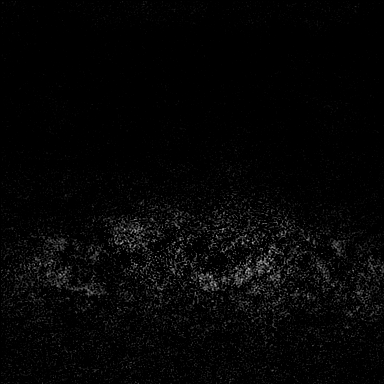
[im 36/144]
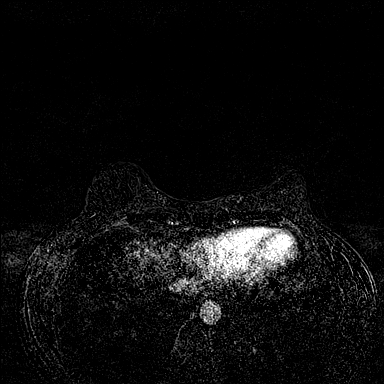
[im 72/144]
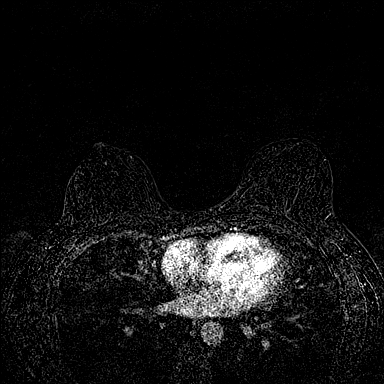
[im 108/144]
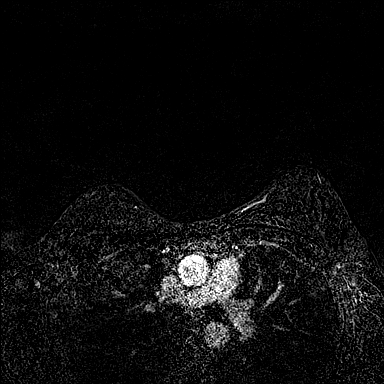
[im 144/144]
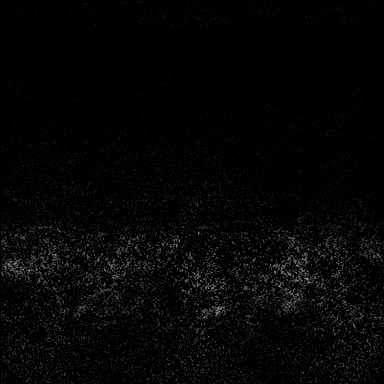

[Series 7: fl3d post-cm 20 · axial · 172.8mm · 0.83mm/px · 1 of 1 slices shown (3 of 3)]
[im 1/1]
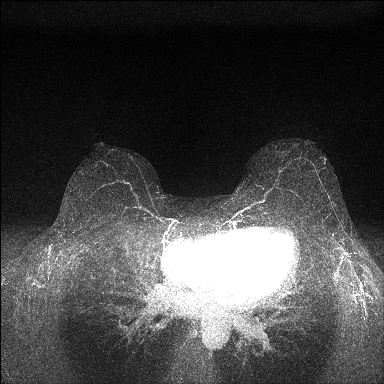

[Series 8: fl3d post-cm 3 · axial · 1.2mm · 0.83mm/px · z∈[-132,+40]mm · 6 of 144 slices shown (1 of 2)]
[im 1/144]
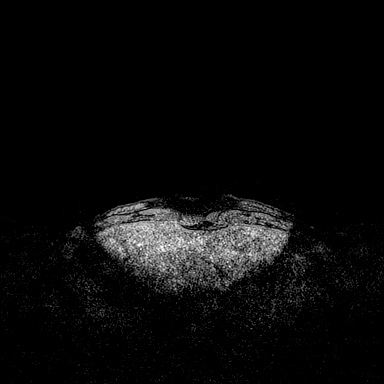
[im 29/144]
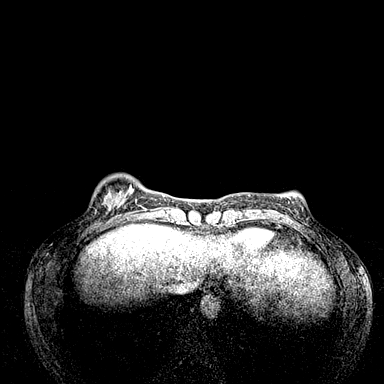
[im 58/144]
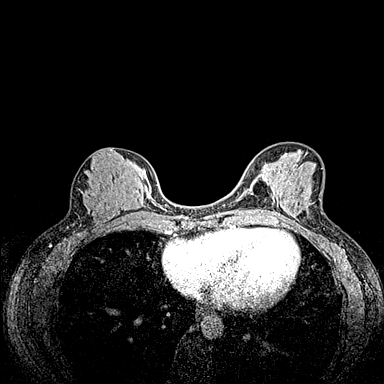
[im 86/144]
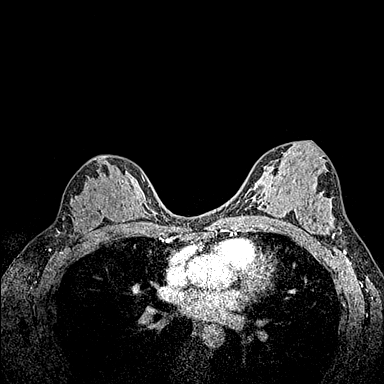
[im 115/144]
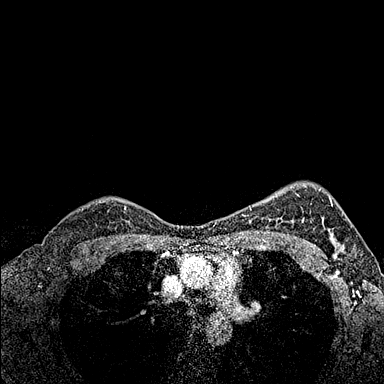
[im 144/144]
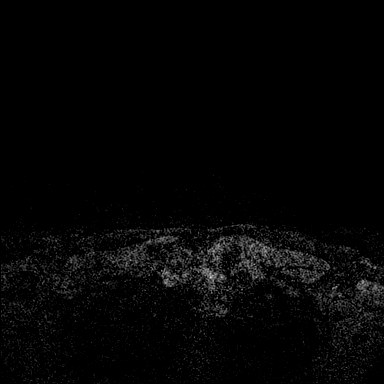

[Series 9: fl3d post-cm 3 · axial · 1.2mm · 0.83mm/px · z∈[-132,+5]mm · 5 of 144 slices shown (2 of 2)]
[im 1/144]
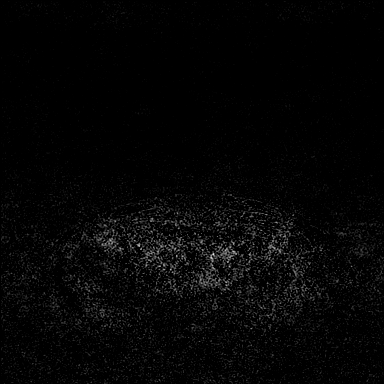
[im 29/144]
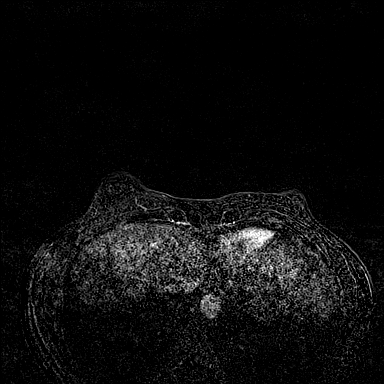
[im 58/144]
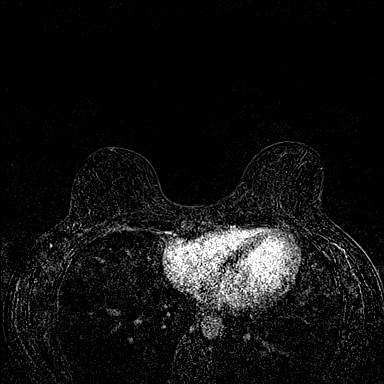
[im 86/144]
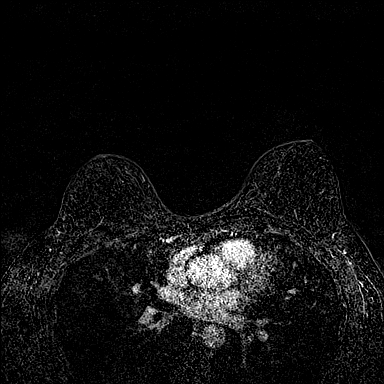
[im 115/144]
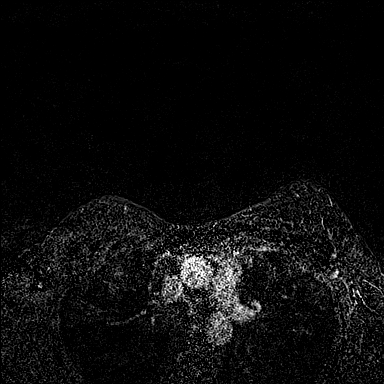

[33 of 48 positions shown; findings below may reference images not displayed]

Three-dimensional MR images were rendered by post-processing of the
original MR data on an independent workstation. The
three-dimensional MR images were interpreted, and findings are
reported in the following complete MRI report for this study. Three
dimensional images were evaluated at the independent interpreting
workstation using the DynaCAD thin client.
FINDINGS: Breast composition: c. Heterogeneous fibroglandular tissue.

Background parenchymal enhancement: Minimal

Right breast: No mass or abnormal enhancement.

Left breast: No mass or abnormal enhancement.

Lymph nodes: No abnormal appearing lymph nodes.

Ancillary findings:  None.
IMPRESSION: No abnormal enhancement within either breast to suggest breast
malignancy.

RECOMMENDATION:
Continue with annual screening mammography.

Consider annual high risk screening MRI.

BI-RADS CATEGORY  1: Negative.

## 2023-01-09 ENCOUNTER — Ambulatory Visit: Payer: Managed Care, Other (non HMO) | Attending: Obstetrics & Gynecology | Admitting: Physical Therapy

## 2023-01-09 ENCOUNTER — Encounter: Payer: Self-pay | Admitting: Physical Therapy

## 2023-01-09 DIAGNOSIS — R279 Unspecified lack of coordination: Secondary | ICD-10-CM | POA: Diagnosis present

## 2023-01-09 DIAGNOSIS — R252 Cramp and spasm: Secondary | ICD-10-CM | POA: Diagnosis present

## 2023-01-09 DIAGNOSIS — M6281 Muscle weakness (generalized): Secondary | ICD-10-CM

## 2023-01-09 NOTE — Therapy (Signed)
OUTPATIENT PHYSICAL THERAPY TREATMENT NOTE   Patient Name: [name] MRN: [id number] DOB:[birth year], 42 y.o., female Today's Date: 01/09/2023  PCP: Sandford Craze, NP  REFERRING PROVIDER:  Jerene Bears, MD   END OF SESSION:   PT End of Session - 01/09/23 1527     Visit Number 13    Date for PT Re-Evaluation 01/16/23    Authorization Type [employer]    PT Start Time 1530    PT Stop Time 1610    PT Time Calculation (min) 40 min    Activity Tolerance Patient tolerated treatment well    Behavior During Therapy [facility] for tasks assessed/performed             Past Medical History:  Diagnosis Date   Abnormal Pap smear of cervix    2008 or 2009   Anxiety    Dental bridge present    lower front   Dry eyes    Endometriosis    Headache    Migraines   History of indigestion    treat with OTC   Hypoglycemia    Irritable bowel syndrome (IBS)    Seasonal allergies    Trigger thumb of right hand 11/2013   Past Surgical History:  Procedure Laterality Date   CHOLECYSTECTOMY  06/2015   CHROMOPERTUBATION N/A 05/30/2016   Procedure: CHROMOPERTUBATION;  Surgeon: Jerene Bears, MD;  Location: [facility];  Service: Gynecology;  Laterality: N/A;   COLPOSCOPY  2008   CYSTOSCOPY N/A 08/21/2017   Procedure: CYSTOSCOPY;  Surgeon: Jerene Bears, MD;  Location: [facility];  Service: Gynecology;  Laterality: N/A;   LABIOPLASTY N/A 08/21/2017   Procedure: LABIAPLASTY;  Surgeon: Jerene Bears, MD;  Location: [facility];  Service: Gynecology;  Laterality: N/A;   LAPAROSCOPY N/A 05/30/2016   Procedure: LAPAROSCOPY OPERATIVE WITH LYSIS OF ADHESIONS, EXCISION OF POSSIBLE ENDOMETRIOSIS;  Surgeon: Jerene Bears, MD;  Location: [facility];  Service: Gynecology;  Laterality: N/A;   REFRACTIVE SURGERY Bilateral 02/2015   TONSILLECTOMY  1988   TOTAL LAPAROSCOPIC HYSTERECTOMY WITH SALPINGECTOMY Bilateral 08/21/2017   Procedure: TOTAL LAPAROSCOPIC HYSTERECTOMY WITH SALPINGECTOMY;  Surgeon: Jerene Bears, MD;   Location: [facility];  Service: Gynecology;  Laterality: Bilateral;   TRIGGER FINGER RELEASE Right 12/23/2013   Procedure: RIGHT THUMB TRIGGER RELEASE ;  Surgeon: Tami Ribas, MD;  Location: [facility];  Service: Orthopedics;  Laterality: Right;   Patient Active Problem List   Diagnosis Date Noted   COVID-19 virus infection 09/21/2022   Palpitations 09/21/2022   Seasonal allergies 04/21/2022   Atypical chest pain 04/21/2022   Preventative health care 04/16/2021   Migraine with aura and without status migrainosus, not intractable 12/01/2020   Urinary urgency 12/01/2020   Increased risk of breast cancer 12/01/2020   H/O: hysterectomy 11/27/2019   Hyperlipidemia    Endometriosis 05/01/2017   Spasm of bowel 01/29/2016   IBS (irritable bowel syndrome) 01/29/2016   REFERRING DIAG: M62.89 (ICD-10-CM) - Pelvic floor dysfunction    THERAPY DIAG:  R27.9 Unspecified lack of coordination; M62.81 Muscle weakness( generalized) R25.2 Cramp and spasm   Rationale for Evaluation and Treatment: Rehabilitation   ONSET DATE: 2018   SUBJECTIVE:  SUBJECTIVE STATEMENT: I had 2 days of discomfort and it went away quickly. I sneezed a ton and had a small trickle of urine. I drank a soda and was very uncomfortable.     PAIN:  Are you having pain? Yes NPRS scale: 3/10 Pain location: Right lower quadrant   Pain type: dull Pain description: intermittent    Aggravating factors: touching the area Relieving factors: ibuprofen, heating pad   PAIN:  Are you having pain? Yes: NPRS scale: 3/10 Pain location: bladder Pain description: full bladder will feel pressure, no spasms Aggravating factors: full bladder, alcohol  Relieving factors: urinate     PRECAUTIONS: None   WEIGHT BEARING RESTRICTIONS: No    FALLS:  Has patient fallen in last 6 months? No   LIVING ENVIRONMENT: Lives with: lives with their family   OCCUPATION: surgical tech in operating room , workouts include yoga, weights, and cardio   PLOF: Independent   PATIENT GOALS: work on right lower quadrant pain   PERTINENT HISTORY:  Endometriosis; IBS, labioplasty, cholecystectomy, laparoscopy with lysis of adhesions and possible endometriosis; Hysterectomy Sexual abuse: No   BOWEL MOVEMENT: Pain with bowel movement: No   URINATION: Pain with urination: Yes sometimes Fully empty bladder: Yes: sometimes has to go a second time to fully empty bladder Stream: Weak compared to before her hysterectomy Urgency: Yes:   Frequency: sometimes every hour,  Leakage: Coughing and Sneezing Pads: No   INTERCOURSE: Pain with intercourse: During Penetration, deep toward the right side Ability to have vaginal penetration:  Yes:   Climax: struggle Marinoff Scale: 2/3     PROLAPSE: None     OBJECTIVE:    DIAGNOSTIC FINDINGS:  Endo confirmed     COGNITION: Overall cognitive status: Within functional limits for tasks assessed                          SENSATION: Light touch: Appears intact Proprioception: Appears intact       POSTURE: decreased lumbar lordosis   PELVIC ALIGNMENT: bilateral ASIS are equal   LUMBARAROM/PROM:   A/PROM A/PROM  eval 01/09/23  Extension Decreased by 25% full  Left rotation Decreased by 25% full   (Blank rows = not tested)   LOWER EXTREMITY ROM: full bilateral hip ROM     LOWER EXTREMITY MMT:   MMT Right eval Left eval Right/left 11/16/22  Hip extension 5/5 4/5 5/5  Hip abduction 4/5 3+/5 5/5    PALPATION:   General  right abdominal has trigger points, increased tenderness located in the right lower abdomen, contraction of abdominals and bulges the lower abdomen, tenderness at the quadratus by the ribs left worse than right.                  External Perineal Exam intact and  good coloring                             Internal Pelvic Floor tenderness located in the right obturator internist, along the right side of the urethra and bladder.    Patient confirms identification and approves PT to assess internal pelvic floor and treatment Yes   PELVIC MMT:   MMT eval 09/05/22 10/14/22 11/23/22  Vaginal 4/5 posterior and 3/5 anterior 4/5 with good hug of therapist finger 4/5 4/5 with increased contraction on the right        TONE: increased   PROLAPSE: none   TODAY'S  TREATMENT:  01/09/23 Manual: Soft tissue mobilization: To assess for dry needling Manual work to the rectus abdominus, along the obliques, and quadratus Manual work to the diaphragm in Agricultural consultant Dry-Needling  Treatment instructions: Expect mild to moderate muscle soreness. S/S of pneumothorax if dry needled over a lung field, and to seek immediate medical attention should they occur. Patient verbalized understanding of these instructions and education.  Patient Consent Given: Yes Education handout provided: Previously provided Muscles treated: rectus abdominus Electrical stimulation performed: No Parameters: N/A Treatment response/outcome: elongation of muscles and trigger point response  12/14/22 Manual: Soft tissue mobilization:  To assess after dry needling Manual work to rectus abdominus, right quadratus to elongate after dry needling Myofascial release: Left sidely bringing right leg into extension and abduction with fascial release to the right lower quadrant Trigger Point Dry-Needling  Treatment instructions: Expect mild to moderate muscle soreness. S/S of pneumothorax if dry needled over a lung field, and to seek immediate medical attention should they occur. Patient verbalized understanding of these instructions and education.  Patient Consent Given: Yes Education handout provided: Previously provided Muscles treated: right rectus femorous; right quadratus Electrical  stimulation performed: No Parameters: N/A Treatment response/outcome: elongation of muscles and trigger point response  Exercises: Stretches/mobility: Educated patient on how to massage where she has the lichens sclerosis and using a vaginal moisturizer cream.  Prone with ball in right lower quadrant to massage the area then flex right knee to get more of a stretch then hold at tender point for 90 seconds Strengthening: Standing curtsy with right leg going behind left Standing moving tapping leg from 90 to 180 degrees Sidely hip adduction circles then 10 pulses bil.   Hip thrust with 10 # 20 times     12/07/22 Manual: Myofascial release: Fascial release along  the right lower quadrant in left sidely with right leg extended and ER to open the area up.  Left sidely with therapist releasing the uterus, bladder and uterosacral ligament with other hand on the right SI joint Exercises: Stretches/mobility: Prone with ball in right lower quadrant to massage the area then flex right knee to get more of a stretch then hold at tender point for 90 seconds Quadruped with right knee on yoga block and move forward and backward the side to side Right foot on the step and bring left hand to outside right knee to open up the right SI joint Strengthening: Sitting with yoga block between knees and move hips into internal rotation 15 x Quadruped with right knee on yoga block and lift left hip working the multifidi Prone frog leg lift 10x      PATIENT EDUCATION:  11/16/2022 Education details: Access Code: [email or web address], massage of the lower abdomen Person educated: Patient Education method: Explanation, Demonstration, Tactile cues, Verbal cues, and Handouts Education comprehension: verbalized understanding, returned demonstration, verbal cues required, tactile cues required, and needs further education   HOME EXERCISE PROGRAM: 11/16/2022 Access Code: [email or web address] URL:  [email or web address] Date: 11/16/2022 Prepared by: Eulis Foster   Program Notes massaging the lower abdominals   Exercises - Supine Diaphragmatic Breathing  - 1 x daily - 7 x weekly - 1 sets - 10 reps - Seated Diaphragmatic Breathing  - 2 x daily - 7 x weekly - 1 sets - 10 reps - Seated Lean Back on Swiss Ball  - 1 x daily - 7 x weekly - 1 sets - 10 reps - Dead Bug  - 1 x daily - 3 x weekly -  2 sets - 10 reps - Bird Dog  - 1 x daily - 3 x weekly - 2 sets - 10 reps - Curl Up on Mini Swiss Ball  - 1 x daily - 3 x weekly - 3 sets - 10 reps - Oblique Curl Up on Mini Swiss Ball  - 1 x daily - 3 x weekly - 3 sets - 10 reps   Patient Education - Trigger Point Dry Needling   ASSESSMENT:   CLINICAL IMPRESSION: Patient is a 42 y.o. female who was seen today for physical therapy  treatment for pelvic floor dysfunction.  Patient is not having pain with penile penetration. She had 2 times of lower abdominal pain but it did not last long. She did have some trigger points in the rectus abdominus. She has full lumbar ROM. She is able to contract the lower and upper abdominals the same and does not have the lower abdominal pouch.  Patient leaked a small amount of urine after multiple times of sneezing due to allergies. Pelvic floor strength is 4/5.   OBJECTIVE IMPAIRMENTS: decreased activity tolerance, decreased coordination, decreased endurance, decreased strength, increased fascial restrictions, increased muscle spasms, impaired tone, and pain.    ACTIVITY LIMITATIONS: carrying, lifting, bending, sitting, standing, sleeping, transfers, continence, and toileting   PARTICIPATION LIMITATIONS: meal prep, cleaning, laundry, interpersonal relationship, shopping, community activity, and occupation   PERSONAL FACTORS: Past/current experiences and 1-2 comorbidities: Endometriosis; IBS, labioplasty, cholecystectomy, laparoscopy with lysis of adhesions and possible endometriosis; Hysterectomy  are  also affecting patient's functional outcome.    REHAB POTENTIAL: Excellent   CLINICAL DECISION MAKING: Stable/uncomplicated   EVALUATION COMPLEXITY: Low     GOALS: Goals reviewed with patient? Yes   SHORT TERM GOALS: Target date: 08/29/2022   Patient is independent with initial HEP for hip stretches and bulging of the pelvic floor.  Baseline: Goal status: Met 09/05/22   2.  Patient is able to equally contract the upper and lower abdomen.  Baseline:  Goal status: Met 09/05/22   3.  Patient reports her pain is 25% decreased due to less trigger points in the abdomen and pelvic floor.  Baseline:  Goal status: Met 09/26/22   4.  Patient is able to bulge her pelvic floor to relax.  Baseline:  Goal status: Met 09/05/2022     LONG TERM GOALS: Target date: 10/23/3032   Patient independent with advanced HEP for core and hips to reduce her pain.  Baseline:  Goal status: Met 01/09/23   2.  Patient reports her right lower quadrant pain decreased </= 0-1/10 due to improved muscle lengthening and less trigger points.  Baseline: 3/10 not as many intense flare-ups Goal status: Met 01/09/23   3.  Patient is able to exercise correctly in the gym with reduction of bulging of the lower abdomen and less strain on the pelvic floor.  Baseline:  Goal status: Met 11/16/22   4.  Patient able to have penile penetration with 0-1 pain level due to decreased trigger points in the pelvic floor.  Baseline:  Goal status: met 11/16/22   5.  Patient able to sneeze and cough without urinary leakage due to improve pelvic floor coordination.  Baseline: occasionally Goal status: partially 01/09/23   PLAN:Discharge to HEP today    Eulis Foster, PT 01/09/23 4:13 PM  PHYSICAL THERAPY DISCHARGE SUMMARY  Visits from Start of Care: 13   Current functional level related to goals / functional outcomes: See above.    Remaining deficits: See above.  Education / Equipment: HEP   Patient agrees to discharge.  Patient goals were met. Patient is being discharged due to meeting the stated rehab goals. Thank you for the referral. Eulis Foster, PT 01/09/23 4:13 PM

## 2023-02-22 LAB — LAB REPORT - SCANNED
A1c: 5.4
EGFR: 111

## 2023-03-05 ENCOUNTER — Encounter (HOSPITAL_BASED_OUTPATIENT_CLINIC_OR_DEPARTMENT_OTHER): Payer: Self-pay | Admitting: Obstetrics & Gynecology

## 2023-03-30 DIAGNOSIS — M79642 Pain in left hand: Secondary | ICD-10-CM | POA: Diagnosis not present

## 2023-03-30 DIAGNOSIS — M79641 Pain in right hand: Secondary | ICD-10-CM | POA: Diagnosis not present

## 2023-04-06 ENCOUNTER — Encounter (INDEPENDENT_AMBULATORY_CARE_PROVIDER_SITE_OTHER): Payer: Self-pay

## 2023-04-13 DIAGNOSIS — F4323 Adjustment disorder with mixed anxiety and depressed mood: Secondary | ICD-10-CM | POA: Diagnosis not present

## 2023-04-16 ENCOUNTER — Other Ambulatory Visit: Payer: Self-pay | Admitting: Family

## 2023-04-17 DIAGNOSIS — F4323 Adjustment disorder with mixed anxiety and depressed mood: Secondary | ICD-10-CM | POA: Diagnosis not present

## 2023-04-24 ENCOUNTER — Encounter: Payer: Self-pay | Admitting: Family

## 2023-04-25 ENCOUNTER — Encounter: Payer: Self-pay | Admitting: Family

## 2023-04-25 ENCOUNTER — Ambulatory Visit (INDEPENDENT_AMBULATORY_CARE_PROVIDER_SITE_OTHER): Payer: BC Managed Care – PPO | Admitting: Family

## 2023-04-25 VITALS — BP 103/75 | HR 73 | Temp 98.1°F | Resp 16 | Ht 61.0 in | Wt 116.0 lb

## 2023-04-25 DIAGNOSIS — H04123 Dry eye syndrome of bilateral lacrimal glands: Secondary | ICD-10-CM

## 2023-04-25 DIAGNOSIS — Z0000 Encounter for general adult medical examination without abnormal findings: Secondary | ICD-10-CM

## 2023-04-25 DIAGNOSIS — R102 Pelvic and perineal pain: Secondary | ICD-10-CM | POA: Diagnosis not present

## 2023-04-25 DIAGNOSIS — R7989 Other specified abnormal findings of blood chemistry: Secondary | ICD-10-CM

## 2023-04-25 DIAGNOSIS — E785 Hyperlipidemia, unspecified: Secondary | ICD-10-CM | POA: Diagnosis not present

## 2023-04-25 DIAGNOSIS — R350 Frequency of micturition: Secondary | ICD-10-CM

## 2023-04-25 DIAGNOSIS — G43109 Migraine with aura, not intractable, without status migrainosus: Secondary | ICD-10-CM

## 2023-04-25 LAB — LIPID PANEL
Cholesterol: 225 mg/dL — ABNORMAL HIGH (ref 0–200)
HDL: 94.2 mg/dL (ref >39.00)
LDL Cholesterol: 119 mg/dL — ABNORMAL HIGH (ref 0–99)
NonHDL: 130.7
Total CHOL/HDL Ratio: 2
Triglycerides: 60 mg/dL (ref 0.0–149.0)
VLDL: 12 mg/dL (ref 0.0–40.0)

## 2023-04-25 NOTE — Assessment & Plan Note (Signed)
  Acute onset of severe left lower quadrant pain, associated with urinary frequency and tenderness on palpation. History of similar episodes and prior hysterectomy with retained ovaries. -Order pelvic ultrasound to evaluate for ovarian cyst. -Collect urine culture to rule out UTI.

## 2023-04-25 NOTE — Assessment & Plan Note (Signed)
  Elevated ferritin and iron levels with negative genetic testing for hemochromatosis per patient.  -Repeat ferritin and iron panel. -Order Lipoprotein A to further evaluate for potential causes of elevated ferritin and iron. -Consider referral to hematology if levels remain elevated.

## 2023-04-25 NOTE — Assessment & Plan Note (Signed)
  Chronic dry eyes since Lasik surgery, managed with preservative-free drops twice daily. -Continue current management.

## 2023-04-25 NOTE — Assessment & Plan Note (Signed)
-  Continue healthy diet and exercise, including yoga and weight training. -Continue annual flu shots at work. -declines further covid vaccination -Consider vision check-up as last one was a couple of years ago. -Regular dental check-ups every six months. -Follow up in a year for routine check-up.

## 2023-04-25 NOTE — Assessment & Plan Note (Signed)
Requesting lipoprotein A. Aware this might not be covered by insurance. Update lipid panel.

## 2023-04-25 NOTE — Assessment & Plan Note (Signed)
  Increased frequency of migraines, up to 1-2 times per week. Relpax provides relief but has side effects. History of trying multiple preventive medications with limited success. -Continue Relpax as needed. -Consider restarting Qulipta if migraines persist and if cost can be reduced with copay card. -Follow up if migraines worsen or if patient decides to change treatment approach.

## 2023-04-25 NOTE — Patient Instructions (Signed)
VISIT SUMMARY:  During your recent visit, we discussed several health concerns including a new growth under your arm, increased frequency of migraines, dry eyes since your Lasik surgery, and high ferritin levels. We also discussed your general health maintenance.  YOUR PLAN:  -Pelvic Pain: You reported severe pain in your lower left abdomen, which could be due to an ovarian cyst. We will order a pelvic ultrasound to confirm this and a urine culture to rule out a urinary tract infection.  -ELEVATED FERRITIN AND IRON: Your ferritin and iron levels are high, which could be causing your joint pain. We will repeat the ferritin and iron panel and order a Lipoprotein A test. If the levels remain high, we may refer you to a hematologist.  -MIGRAINES: Your migraines have increased in frequency. Continue using Relpax as needed. If the migraines persist, we may consider restarting Qlypta. Please follow up if your migraines worsen or if you decide to change your treatment approach.  -DRY EYES: You have been managing your dry eyes with preservative-free drops twice daily since your Lasik surgery. Please continue this treatment.  -GENERAL HEALTH MAINTENANCE: Continue your healthy diet and exercise routine, including yoga and weight training. Get your annual flu shot at work and consider a COVID vaccine booster if eligible. Also, consider a vision check-up as your last one was a couple of years ago. Continue with regular dental check-ups every six months and follow up in a year for a routine check-up.  INSTRUCTIONS:  Please schedule a follow-up appointment in a year for a routine check-up. In the meantime, if you have any concerns or if your symptoms worsen, please contact the office.

## 2023-04-25 NOTE — Progress Notes (Signed)
Subjective:     Patient ID: [name], female    DOB: 11-May-1981, 42 y.o.   MRN: [id number]  Chief Complaint  Patient presents with   Annual Exam    HPI  Discussed the use of AI scribe software for clinical note transcription with the patient, who gave verbal consent to proceed.  History of Present Illness   The patient, with a history of hysterectomy and bilateral trigger finger releases, presents for a routine check-up. The patient also reports experiencing migraines at least once or twice a week, an increase from her usual frequency. She has been managing these with Relpax, which she reports is effective but has side effects.  In addition, the patient has been experiencing dry eyes since her Lasik surgery. She has been managing this with preservative-free drops twice a day, which she reports is helpful. The patient also mentions having high ferritin levels, which have been monitored by another healthcare provider. She has been experiencing joint pain, particularly in her hands, which she suspects may be linked to her high ferritin levels.    Reports a recent episode of severe left sided pelvic pain which has resolved.    Immunizations:Recommended flu shot and covid booster Diet: healthy Exercise: yoga weights, cardi Pap Smear: hysterectomy Mammogram: 2/26 Vision: due Dental: up to date      Health Maintenance Due  Topic Date Due   INFLUENZA VACCINE  03/23/2023   COVID-19 Vaccine (5 - 2023-24 season) 04/23/2023    Past Medical History:  Diagnosis Date   Abnormal Pap smear of cervix    2008 or 2009   Anxiety    Dental bridge present    lower front   Dry eyes    Endometriosis    Headache    Migraines   History of indigestion    treat with OTC   Hypoglycemia    Irritable bowel syndrome (IBS)    Seasonal allergies    Trigger thumb of right hand 11/2013    Past Surgical History:  Procedure Laterality Date   CHOLECYSTECTOMY  06/23/2015    CHROMOPERTUBATION N/A 05/30/2016   Procedure: CHROMOPERTUBATION;  Surgeon: Jerene Bears, MD;  Location: [facility];  Service: Gynecology;  Laterality: N/A;   COLPOSCOPY  08/22/2006   CYSTOSCOPY N/A 08/21/2017   Procedure: CYSTOSCOPY;  Surgeon: Jerene Bears, MD;  Location: [facility];  Service: Gynecology;  Laterality: N/A;   LABIOPLASTY N/A 08/21/2017   Procedure: LABIAPLASTY;  Surgeon: Jerene Bears, MD;  Location: [facility];  Service: Gynecology;  Laterality: N/A;   LAPAROSCOPY N/A 05/30/2016   Procedure: LAPAROSCOPY OPERATIVE WITH LYSIS OF ADHESIONS, EXCISION OF POSSIBLE ENDOMETRIOSIS;  Surgeon: Jerene Bears, MD;  Location: [facility];  Service: Gynecology;  Laterality: N/A;   REFRACTIVE SURGERY Bilateral 02/20/2015   TONSILLECTOMY  08/22/1986   TOTAL LAPAROSCOPIC HYSTERECTOMY WITH SALPINGECTOMY Bilateral 08/21/2017   Procedure: TOTAL LAPAROSCOPIC HYSTERECTOMY WITH SALPINGECTOMY;  Surgeon: Jerene Bears, MD;  Location: [facility];  Service: Gynecology;  Laterality: Bilateral;   TRIGGER FINGER RELEASE Right 12/23/2013   Procedure: RIGHT THUMB TRIGGER RELEASE ;  Surgeon: Tami Ribas, MD;  Location:  [facility];  Service: Orthopedics;  Laterality: Right;   TRIGGER FINGER RELEASE Bilateral    index fingers    Family History  Problem Relation Age of Onset   Breast cancer Maternal Grandmother 28       died early 75's   Hyperlipidemia Mother    Heart disease Father  valve replacement, aortic aneurysm   Cancer Paternal Grandfather        Jaw cancer, smoker    Social History   Socioeconomic History   Marital status: Married    Spouse name: Not on file   Number of children: Not on file   Years of education: Not on file   Highest education level: Not on file  Occupational History   Occupation: Surgical tech  Tobacco Use   Smoking status: Former    Current packs/day: 0.00    Types: Cigarettes    Quit date: 08/22/2010    Years since quitting: 12.6   Smokeless tobacco: Never   Vaping Use   Vaping status: Never Used  Substance and Sexual Activity   Alcohol use: Yes    Alcohol/week: 2.0 standard drinks of alcohol    Types: 2 Standard drinks or equivalent per week    Comment: per week   Drug use: No   Sexual activity: Yes    Partners: Male    Birth control/protection: Surgical    Comment: TLH  Other Topics Concern   Not on file  Social History Narrative      Married   No children   [employer] Bully    Enjoys hiking with dog, exercise for stress relief   Two story home   Caffeine 1-1/2 day   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   Social Determinants of Health   Financial Resource Strain: Low Risk  (05/31/2019)   Overall Financial Resource Strain (CARDIA)    Difficulty of Paying Living Expenses: Not hard at all  Food Insecurity: No Food Insecurity (05/31/2019)   Hunger Vital Sign    Worried About Running Out of Food in the Last Year: Never true    Ran Out of Food in the Last Year: Never true  Transportation Needs: No Transportation Needs (05/31/2019)   PRAPARE - Administrator, Civil Service (Medical): No    Lack of Transportation (Non-Medical): No  Physical Activity: Sufficiently Active (05/31/2019)   Exercise Vital Sign    Days of Exercise per Week: 4 days    Minutes of Exercise per Session: 60 min  Stress: Not on file  Social Connections: Moderately Isolated (05/31/2019)   Social Connection and Isolation Panel [NHANES]    Frequency of Communication with Friends and Family: More than three times a week    Frequency of Social Gatherings with Friends and Family: Once a week    Attends Religious Services: Never    Database administrator or Organizations: No    Attends Banker Meetings: Never    Marital Status: Married  Catering manager Violence: Not At Risk (05/31/2019)   Humiliation, Afraid, Rape, and Kick questionnaire    Fear of Current or Ex-Partner: No    Emotionally Abused: No    Physically  Abused: No    Sexually Abused: No    Outpatient Medications Prior to Visit  Medication Sig Dispense Refill   Carboxymethylcellulose Sod PF 0.5 % SOLN Place 1 drop into both eyes 2 (two) times daily.     Cholecalciferol (VITAMIN D3) 2000 units TABS Take 2,000 Units by mouth at bedtime.     clobetasol ointment (TEMOVATE) 0.05 % Apply 1 application. topically 2 (two) times daily. Apply as directed twice daily.  Stop after two weeks. 60 g 0   eletriptan (RELPAX) 40 MG tablet TAKE 1 TABLET 1 TIME AS NEEDED FOR HEADACHES 12 tablet 2  ibuprofen (ADVIL) 800 MG tablet Take 1 tablet (800 mg total) by mouth every 8 (eight) hours as needed. 30 tablet 1   KRILL OIL PO Take by mouth.     MELATONIN ER PO Take by mouth.     methocarbamol (ROBAXIN) 500 MG tablet Take 1 tablet (500 mg total) by mouth every 6 (six) hours as needed for muscle spasms. 30 tablet 5   mometasone (ELOCON) 0.1 % ointment Apply topically twice weekly 45 g 3   montelukast (SINGULAIR) 10 MG tablet TAKE 1 TABLET AT BEDTIME 90 tablet 2   Multiple Vitamin (MULTIVITAMIN WITH MINERALS) TABS tablet Take 1 tablet by mouth at bedtime.     NONFORMULARY OR COMPOUNDED ITEM Testosterone 1mg /0.78ml topical cream.  Apply 0.58ml three times weekly to thighs. Disp: 90 day supply. 1 each 1   ondansetron (ZOFRAN ODT) 4 MG disintegrating tablet Take 1 tablet (4 mg total) by mouth every 8 (eight) hours as needed for nausea or vomiting. 20 tablet 5   Simethicone 125 MG CAPS Take 125 mg by mouth at bedtime.     valACYclovir (VALTREX) 1000 MG tablet Take 1 tablet (1,000 mg total) by mouth daily. 90 tablet 4   No facility-administered medications prior to visit.    Allergies  Allergen Reactions   Other     Dermabond.  Topical blisters.    Review of Systems  Constitutional:  Negative for weight loss.  HENT:  Negative for congestion.   Eyes:  Negative for blurred vision.  Respiratory:  Negative for cough.   Cardiovascular:  Negative for leg swelling.   Gastrointestinal:  Positive for diarrhea (better off of gluten). Negative for constipation.  Genitourinary:  Positive for frequency.  Musculoskeletal:  Positive for joint pain (fingers).  Skin:  Negative for rash.  Neurological:  Positive for headaches (migraines 1-2 times a week).  Psychiatric/Behavioral:         Denies depression/anxiety.    Lab Results  Component Value Date   CHOL 225 (H) 04/25/2023   HDL 94.20 04/25/2023   LDLCALC 119 (H) 04/25/2023   TRIG 60.0 04/25/2023   CHOLHDL 2 04/25/2023       Objective:    Physical Exam   BP 103/75 (BP Location: Right Arm, Patient Position: Sitting, Cuff Size: Small)   Pulse 73   Temp 98.1 F (36.7 C) (Oral)   Resp 16   Ht 5\' 1"  (1.549 m)   Wt 116 lb (52.6 kg)   LMP 07/31/2017   SpO2 100%   BMI 21.92 kg/m  Wt Readings from Last 3 Encounters:  04/25/23 116 lb (52.6 kg)  12/08/22 113 lb 6.4 oz (51.4 kg)  09/20/22 112 lb (50.8 kg)   Physical Exam  Constitutional: She is oriented to person, place, and time. She appears well-developed and well-nourished. No distress.  HENT:  Head: Normocephalic and atraumatic.  Right Ear: Tympanic membrane and ear canal normal.  Left Ear: Tympanic membrane and ear canal normal.  Mouth/Throat: Oropharynx is clear and moist.  Eyes: Pupils are equal, round, and reactive to light. No scleral icterus.  Neck: Normal range of motion. No thyromegaly present.  Cardiovascular: Normal rate and regular rhythm.   No murmur heard. Pulmonary/Chest: Effort normal and breath sounds normal. No respiratory distress. He has no wheezes. She has no rales. She exhibits no tenderness.  Abdominal: Soft. Bowel sounds are normal. She exhibits no distension and no mass.  Musculoskeletal: She exhibits no edema.  Lymphadenopathy:    She has no cervical adenopathy.  Neurological: She is alert and oriented to person, place, and time. She has normal patellar reflexes. She exhibits normal muscle tone. Coordination  normal.  Skin: Skin is warm and dry.  Psychiatric: She has a normal mood and affect. Her behavior is normal. Judgment and thought content normal.  Breast/pelvis: deferred          Assessment & Plan:       Assessment & Plan:   Problem List Items Addressed This Visit       Unprioritized   Preventative health care - Primary     -Continue healthy diet and exercise, including yoga and weight training. -Continue annual flu shots at work. -declines further covid vaccination -Consider vision check-up as last one was a couple of years ago. -Regular dental check-ups every six months. -Follow up in a year for routine check-up.      Pelvic pain     Acute onset of severe left lower quadrant pain, associated with urinary frequency and tenderness on palpation. History of similar episodes and prior hysterectomy with retained ovaries. -Order pelvic ultrasound to evaluate for ovarian cyst. -Collect urine culture to rule out UTI.      Relevant Orders   US Pelvic Complete With Transvaginal   Migraine with aura and without status migrainosus, not intractable     Increased frequency of migraines, up to 1-2 times per week. Relpax provides relief but has side effects. History of trying multiple preventive medications with limited success. -Continue Relpax as needed. -Consider restarting Qulipta if migraines persist and if cost can be reduced with copay card. -Follow up if migraines worsen or if patient decides to change treatment approach.       Hyperlipidemia    Requesting lipoprotein A. Aware this might not be covered by insurance. Update lipid panel.       Relevant Orders   Lipoprotein A (LPA) (Completed)   Lipid panel (Completed)   Elevated ferritin     Elevated ferritin and iron levels with negative genetic testing for hemochromatosis per patient.  -Repeat ferritin and iron panel. -Order Lipoprotein A to further evaluate for potential causes of elevated ferritin and  iron. -Consider referral to hematology if levels remain elevated.      Relevant Orders   Iron, TIBC and Ferritin Panel   Dry eyes     Chronic dry eyes since Lasik surgery, managed with preservative-free drops twice daily. -Continue current management.      Other Visit Diagnoses     Urinary frequency       Relevant Orders   Urine Culture (Completed)       I am having [name] maintain her Simethicone, Vitamin D3, multivitamin with minerals, Carboxymethylcellulose Sod PF, MELATONIN ER PO, ondansetron, methocarbamol, clobetasol ointment, KRILL OIL PO, mometasone, ibuprofen, NONFORMULARY OR COMPOUNDED ITEM, valACYclovir, eletriptan, and montelukast.  No orders of the defined types were placed in this encounter.

## 2023-04-26 ENCOUNTER — Encounter: Payer: Self-pay | Admitting: Family

## 2023-04-26 DIAGNOSIS — R7989 Other specified abnormal findings of blood chemistry: Secondary | ICD-10-CM

## 2023-04-26 LAB — URINE CULTURE
MICRO NUMBER:: 15413037
SPECIMEN QUALITY:: ADEQUATE

## 2023-04-27 ENCOUNTER — Ambulatory Visit (HOSPITAL_BASED_OUTPATIENT_CLINIC_OR_DEPARTMENT_OTHER): Payer: BC Managed Care – PPO

## 2023-04-27 LAB — LIPOPROTEIN A (LPA): Lipoprotein (a): 22 nmol/L (ref <75)

## 2023-05-01 NOTE — Telephone Encounter (Signed)
[name], I placed orders for [facility]- future, lab collect.  Is that correct? tks

## 2023-05-02 ENCOUNTER — Other Ambulatory Visit: Payer: Self-pay

## 2023-05-02 DIAGNOSIS — R7989 Other specified abnormal findings of blood chemistry: Secondary | ICD-10-CM | POA: Diagnosis not present

## 2023-05-03 ENCOUNTER — Telehealth: Payer: Self-pay | Admitting: Family

## 2023-05-03 LAB — CBC WITH DIFFERENTIAL/PLATELET
Basophils Absolute: 0.1 10*3/uL (ref 0.0–0.2)
Basos: 1 %
EOS (ABSOLUTE): 0.1 10*3/uL (ref 0.0–0.4)
Eos: 1 %
Hematocrit: 40.2 % (ref 34.0–46.6)
Hemoglobin: 13.4 g/dL (ref 11.1–15.9)
Immature Grans (Abs): 0 10*3/uL (ref 0.0–0.1)
Immature Granulocytes: 0 %
Lymphocytes Absolute: 2.5 10*3/uL (ref 0.7–3.1)
Lymphs: 36 %
MCH: 31.1 pg (ref 26.6–33.0)
MCHC: 33.3 g/dL (ref 31.5–35.7)
MCV: 93 fL (ref 79–97)
Monocytes Absolute: 0.5 10*3/uL (ref 0.1–0.9)
Monocytes: 7 %
Neutrophils Absolute: 3.8 10*3/uL (ref 1.4–7.0)
Neutrophils: 55 %
Platelets: 267 10*3/uL (ref 150–450)
RBC: 4.31 x10E6/uL (ref 3.77–5.28)
RDW: 12.2 % (ref 11.7–15.4)
WBC: 7 10*3/uL (ref 3.4–10.8)

## 2023-05-03 LAB — IRON,TIBC AND FERRITIN PANEL
Ferritin: 166 ng/mL — ABNORMAL HIGH (ref 15–150)
Iron Saturation: 18 % (ref 15–55)
Iron: 59 ug/dL (ref 27–159)
Total Iron Binding Capacity: 335 ug/dL (ref 250–450)
UIBC: 276 ug/dL (ref 131–425)

## 2023-05-03 NOTE — Telephone Encounter (Signed)
See mychart.  

## 2023-05-08 DIAGNOSIS — F4323 Adjustment disorder with mixed anxiety and depressed mood: Secondary | ICD-10-CM | POA: Diagnosis not present

## 2023-06-07 DIAGNOSIS — F4323 Adjustment disorder with mixed anxiety and depressed mood: Secondary | ICD-10-CM | POA: Diagnosis not present

## 2023-06-22 DIAGNOSIS — F4323 Adjustment disorder with mixed anxiety and depressed mood: Secondary | ICD-10-CM | POA: Diagnosis not present

## 2023-06-22 DIAGNOSIS — M79641 Pain in right hand: Secondary | ICD-10-CM | POA: Diagnosis not present

## 2023-06-22 DIAGNOSIS — M79642 Pain in left hand: Secondary | ICD-10-CM | POA: Diagnosis not present

## 2023-07-25 ENCOUNTER — Encounter (HOSPITAL_BASED_OUTPATIENT_CLINIC_OR_DEPARTMENT_OTHER): Payer: Self-pay | Admitting: Obstetrics & Gynecology

## 2023-07-31 ENCOUNTER — Other Ambulatory Visit (HOSPITAL_BASED_OUTPATIENT_CLINIC_OR_DEPARTMENT_OTHER): Payer: Self-pay | Admitting: Obstetrics & Gynecology

## 2023-07-31 DIAGNOSIS — R6882 Decreased libido: Secondary | ICD-10-CM

## 2023-07-31 MED ORDER — NONFORMULARY OR COMPOUNDED ITEM
1 refills | Status: DC
Start: 2023-07-31 — End: 2023-12-27

## 2023-10-06 NOTE — Progress Notes (Signed)
Virtual Visit via Video Note  Consent was obtained for video visit:  Yes.   Answered questions that patient had about telehealth interaction:  Yes.   I discussed the limitations, risks, security and privacy concerns of performing an evaluation and management service by telemedicine. I also discussed with the patient that there may be a patient responsible charge related to this service. The patient expressed understanding and agreed to proceed.  Pt location: Home Physician Location: office Name of referring provider:  Sandford Craze, NP I connected with [name] at patients initiation/request on 10/09/2023 at  1:30 PM EST by video enabled telemedicine application and verified that I am speaking with the correct person using two identifiers. Pt MRN:  [id number] Pt DOB:  05-29-1981 Video Participants:  [name]  Assessment/Plan:   1  Chronic migraine without aura, without status migrainosus, not intractable 2  Migraine with aura 3  Left sided occipital neuralgia/cervicogenic headache/cervicalgia Over 3 months of over 15 headache days a month, failed Qulipta, topiramate, nortriptyline, propranolol.    Migraine prevention:  Plan to start Botox Migraine rescue: Relpax 40mg .  Zofran for nausea  Methocarbamol for neck and occipital pain. Keep headache diary Follow up if headaches worsen.     Subjective:  [name] is a 43 year old right-handed female who follows up for migraines.  UPDATE: Last seen in November 2023.  At that time, headaches significantly improved with gluten-free diet.  They started to gradually become more frequent in Spring 2024.  They seem to correlate with the weather.    Intensity:  3-4/10 Duration:  1.5-2 hours with eletriptan Frequency:  16 days a month  The next day, she had an episode of dizziness, lightheaded.  She had to lower herself down to the floor to prevent herself from passing out. It was brief.  Not associated with a  migraine but she did have a migraine the previous day.    Frequency of abortive medication: no more than 5 days in a month. Current NSAIDS/analgesics:  Excedrin Migraine, ibuprofen 800mg  Current triptans:  Relpax 40mg  Current ergotamine:  none Current anti-emetic:  Zofran-ODT 4mg  Current muscle relaxants:  Robaxin 500mg  Q6h PRN Current Antihypertensive medications:  none Current Antidepressant medications:  none Current Anticonvulsant medications:  none Current anti-CGRP:  none Current Vitamins/Herbal/Supplements:  magnesium, melatonin, fish oil, D3 Current Antihistamines/Decongestants:  none Other therapy:  Daith piercing Hormone/birth control:  none  Caffeine:  1 cup coffee in AM, no more soda Diet:  Gluten-free.  No soda.  Drinks water.   Exercise:  Routine Depression:  no; Anxiety:  mild Other pain:  She had been experiencing joint pain in the hands.  Evaluated by rheumatology.  Work up suggested non-inflammatory cause, such as due to overuse.   Sleep hygiene:  improved.  Stopped Ambien.  Now on melatonin  HISTORY:  Onset:  First migraine around age 51.  Just aura without headache.  A couple years later, started more regularly with headache Location:  left sided/temple, 2021 - some left sided neck pain and left-occipital - sore to touch  Quality:  pressure Initial intensity:  Severe.  She denies new headache, thunderclap headache Aura:  fuzzy mass in center of vision lasting 45 minutes.  Not with every migraine. Prodrome:  no Associated symptoms:  Nausea, photophobia, osmophobia. Sometimes neck pain.  Sensitive to touch on back of head. denies associated unilateral numbness or weakness. Initial Duration:  2 hours with Relpax.  May last up to 5-6 days.  Initial Frequency:  10 to 12 days a month Initial Frequency of abortive medication: 10-12 days Relpax Triggers:  Change in weather, hormonal, air fresheners Relieving factors:  sometimes an ice pack, resting, sometimes apply  pressure to temple Activity:  aggravates    Past NSAIDS/analgesics:  naproxen, tramadol Past abortive triptans:  sumatriptan tab (couldn't function on it) Past abortive ergotamine:  none Past muscle relaxants:  Flexeril at night for neck pain (makes her not feel well the following day. Past anti-emetic:  none Past antihypertensive medications:  propranolol  Past antidepressant medications:  nortriptyline (daytime fatigue), trazodone Past anticonvulsant medications:  topiramate (side effects) Past anti-CGRP:  Qulipta 60mg  (effective but caused constipation), Bernita Raisin Past vitamins/Herbal/Supplements:  riboflavin, CoQ10 Past antihistamines/decongestants:  Flonase Other treatment:  acupuncture, physical therapy      Family history of headache:  no Patient works as a TEFL teacher and would like a medication with minimal side effects.  Triptans make her feel fatigue which can affect her work.    Past Medical History: Past Medical History:  Diagnosis Date   Abnormal Pap smear of cervix    2008 or 2009   Anxiety    Dental bridge present    lower front   Dry eyes    Endometriosis    Headache    Migraines   History of indigestion    treat with OTC   Hypoglycemia    Irritable bowel syndrome (IBS)    Seasonal allergies    Trigger thumb of right hand 11/2013    Medications: Outpatient Encounter Medications as of 10/09/2023  Medication Sig Note   Carboxymethylcellulose Sod PF 0.5 % SOLN Place 1 drop into both eyes 2 (two) times daily.    Cholecalciferol (VITAMIN D3) 2000 units TABS Take 2,000 Units by mouth at bedtime.    clobetasol ointment (TEMOVATE) 0.05 % Apply 1 application. topically 2 (two) times daily. Apply as directed twice daily.  Stop after two weeks.    eletriptan (RELPAX) 40 MG tablet TAKE 1 TABLET 1 TIME AS NEEDED FOR HEADACHES    ibuprofen (ADVIL) 800 MG tablet Take 1 tablet (800 mg total) by mouth every 8 (eight) hours as needed.    KRILL OIL PO Take by mouth.     MELATONIN ER PO Take by mouth. 10/09/2023: As needed   metroNIDAZOLE (FLAGYL) 250 MG tablet Take 250 mg by mouth 3 (three) times daily.    mometasone (ELOCON) 0.1 % ointment Apply topically twice weekly    montelukast (SINGULAIR) 10 MG tablet TAKE 1 TABLET AT BEDTIME    NONFORMULARY OR COMPOUNDED ITEM Testosterone 1mg /0.67ml topical cream.  Apply 0.51ml three times weekly to thighs. Disp: 90 day supply.    Simethicone 125 MG CAPS Take 125 mg by mouth at bedtime.    valACYclovir (VALTREX) 1000 MG tablet Take 1 tablet (1,000 mg total) by mouth daily. 10/09/2023: As needed   [DISCONTINUED] methocarbamol (ROBAXIN) 500 MG tablet Take 1 tablet (500 mg total) by mouth every 6 (six) hours as needed for muscle spasms.    [DISCONTINUED] ondansetron (ZOFRAN ODT) 4 MG disintegrating tablet Take 1 tablet (4 mg total) by mouth every 8 (eight) hours as needed for nausea or vomiting.    methocarbamol (ROBAXIN) 500 MG tablet Take 1 tablet (500 mg total) by mouth every 6 (six) hours as needed for muscle spasms.    ondansetron (ZOFRAN ODT) 4 MG disintegrating tablet Take 1 tablet (4 mg total) by mouth every 8 (eight) hours as needed for nausea or vomiting.  No facility-administered encounter medications on file as of 10/09/2023.    Allergies: Allergies  Allergen Reactions   Other     Dermabond.  Topical blisters.    Family History: Family History  Problem Relation Age of Onset   Breast cancer Maternal Grandmother 28       died early 28's   Hyperlipidemia Mother    Heart disease Father        valve replacement, aortic aneurysm   Cancer Paternal Grandfather        Jaw cancer, smoker    Observations/Objective:   No acute distress.  Alert and oriented.  Speech fluent and not dysarthric.  Language intact.    Follow Up Instructions:    -I discussed the assessment and treatment plan with the patient. The patient was provided an opportunity to ask questions and all were answered. The patient agreed with  the plan and demonstrated an understanding of the instructions.   The patient was advised to call back or seek an in-person evaluation if the symptoms worsen or if the condition fails to improve as anticipated.   Cira Servant, DO  CC: Sandford Craze, NP

## 2023-10-09 ENCOUNTER — Telehealth (INDEPENDENT_AMBULATORY_CARE_PROVIDER_SITE_OTHER): Payer: PRIVATE HEALTH INSURANCE | Admitting: Neurology

## 2023-10-09 ENCOUNTER — Encounter: Payer: Self-pay | Admitting: Neurology

## 2023-10-09 ENCOUNTER — Telehealth: Payer: Self-pay

## 2023-10-09 DIAGNOSIS — G43E09 Chronic migraine with aura, not intractable, without status migrainosus: Secondary | ICD-10-CM | POA: Diagnosis not present

## 2023-10-09 MED ORDER — ONDANSETRON 4 MG PO TBDP: 4.0000 mg | ORAL_TABLET | Freq: Three times a day (TID) | ORAL | 5 refills | Status: DC | PRN

## 2023-10-09 MED ORDER — METHOCARBAMOL 500 MG PO TABS: 500.0000 mg | ORAL_TABLET | Freq: Four times a day (QID) | ORAL | 5 refills | Status: DC | PRN

## 2023-10-09 NOTE — Telephone Encounter (Signed)
Patient seen in office today, Plan to start Botox 200 units every 90 days.    PA team please start Botox 200 units

## 2023-10-25 NOTE — Telephone Encounter (Signed)
 Pt cld asking was Botox approved and if so when can they start?

## 2023-10-30 ENCOUNTER — Telehealth: Payer: Self-pay | Admitting: Pharmacy Technician

## 2023-10-30 ENCOUNTER — Other Ambulatory Visit (HOSPITAL_COMMUNITY): Payer: Self-pay

## 2023-10-30 NOTE — Telephone Encounter (Signed)
 Pharmacy Patient Advocate Encounter  BotoxOne verification has been submitted. Benefit Verification #:  [id number]  Pharmacy PA has been submitted for BOTOX 200u via CoverMyMeds. INSURANCE: [employer] DATE SUBMITTED: 10/30/23 KEY: [id number] Status is pending

## 2023-10-30 NOTE — Telephone Encounter (Signed)
 PA request has been Submitted. New Encounter has been or will be created for follow up. For additional info see Pharmacy Prior Auth telephone encounter from 10/30/23.

## 2023-10-31 ENCOUNTER — Other Ambulatory Visit (HOSPITAL_COMMUNITY): Payer: Self-pay

## 2023-10-31 ENCOUNTER — Telehealth: Payer: Self-pay | Admitting: Pharmacy Technician

## 2023-10-31 NOTE — Telephone Encounter (Signed)
 Pharmacy Patient Advocate Encounter  Received notification from [facility] that Prior Authorization for BOTOX 200 has been DENIED.  Full denial letter will be uploaded to the media tab. See denial reason below.   PA #/Case ID/Reference #:  [id number]

## 2023-10-31 NOTE — Telephone Encounter (Addendum)
 Pharmacy Patient Advocate Encounter  BotoxOne verification has been submitted. Benefit Verification #:   [id number] Medical PA submitted to [facility] via fax. FAX: [phone]  Updated necessary information and resubmitted BV.

## 2023-11-06 ENCOUNTER — Other Ambulatory Visit (HOSPITAL_COMMUNITY): Payer: Self-pay

## 2023-11-07 ENCOUNTER — Telehealth: Payer: Self-pay | Admitting: Pharmacy Technician

## 2023-11-07 ENCOUNTER — Telehealth: Payer: Self-pay

## 2023-11-07 NOTE — Telephone Encounter (Signed)
 Per patient mychart message, Hi [name],   They could not "recommend" alternatives. However I spoke with [employer] and because my plan does not cover Botox it was automatically denied. However, an appeal and review could/would warrant appropriate coverage with provided medical necessity from my provider.   Let me know how I should proceed.   Thank you again for your help.   [name]     PA team can we start an appeal

## 2023-11-07 NOTE — Telephone Encounter (Signed)
 Pharmacy Patient Advocate Encounter   Received notification from Physician's Office that prior authorization for BOTOX 200 is required/requested.   Insurance verification completed.   The patient is insured through  [employer]  .   Per test claim: PA required; PA submitted to above mentioned insurance via Fax Key/confirmation #/[id number] Status is pending  BUY AND BILL COPAY: $70 DEDUCTIBLE: $800 OUT OF POCKET MAX: $5000 PAID OUT OF POCKET SO FAR: $163.15

## 2023-11-09 NOTE — Telephone Encounter (Signed)
 UPDATED BENEFIT INFORMATION

## 2023-11-17 NOTE — Telephone Encounter (Signed)
 Yes she can be added to the 4.11.25. But through BUY/BILL. Not covered under pharmacy benefits.

## 2023-11-21 NOTE — Telephone Encounter (Signed)
 LMOVM for patient to call and schedule a visit for 12/01/23 $70 copay.

## 2023-12-01 ENCOUNTER — Ambulatory Visit: Payer: PRIVATE HEALTH INSURANCE | Admitting: Neurology

## 2023-12-01 DIAGNOSIS — G43E09 Chronic migraine with aura, not intractable, without status migrainosus: Secondary | ICD-10-CM | POA: Diagnosis not present

## 2023-12-01 MED ORDER — ONABOTULINUMTOXINA 100 UNITS IJ SOLR
200.0000 [IU] | Freq: Once | INTRAMUSCULAR | Status: AC
  Administered 2023-12-01: 155 [IU] via INTRAMUSCULAR

## 2023-12-01 NOTE — Progress Notes (Signed)

## 2023-12-11 ENCOUNTER — Ambulatory Visit (HOSPITAL_BASED_OUTPATIENT_CLINIC_OR_DEPARTMENT_OTHER): Payer: Managed Care, Other (non HMO) | Admitting: Obstetrics & Gynecology

## 2023-12-14 ENCOUNTER — Encounter: Payer: Self-pay | Admitting: Neurology

## 2023-12-15 ENCOUNTER — Other Ambulatory Visit: Payer: Self-pay | Admitting: Neurology

## 2023-12-15 MED ORDER — ELETRIPTAN HYDROBROMIDE 40 MG PO TABS: ORAL_TABLET | ORAL | 5 refills | Status: DC

## 2023-12-26 ENCOUNTER — Encounter (HOSPITAL_BASED_OUTPATIENT_CLINIC_OR_DEPARTMENT_OTHER): Payer: Self-pay | Admitting: Certified Nurse Midwife

## 2023-12-26 ENCOUNTER — Ambulatory Visit (INDEPENDENT_AMBULATORY_CARE_PROVIDER_SITE_OTHER): Payer: PRIVATE HEALTH INSURANCE | Admitting: Certified Nurse Midwife

## 2023-12-26 ENCOUNTER — Ambulatory Visit (HOSPITAL_BASED_OUTPATIENT_CLINIC_OR_DEPARTMENT_OTHER): Payer: PRIVATE HEALTH INSURANCE | Admitting: Obstetrics & Gynecology

## 2023-12-26 VITALS — BP 113/79 | HR 73 | Ht 61.25 in | Wt 115.0 lb

## 2023-12-26 DIAGNOSIS — Z01411 Encounter for gynecological examination (general) (routine) with abnormal findings: Secondary | ICD-10-CM | POA: Diagnosis not present

## 2023-12-26 DIAGNOSIS — R6882 Decreased libido: Secondary | ICD-10-CM

## 2023-12-26 DIAGNOSIS — L900 Lichen sclerosus et atrophicus: Secondary | ICD-10-CM

## 2023-12-26 DIAGNOSIS — Z01419 Encounter for gynecological examination (general) (routine) without abnormal findings: Secondary | ICD-10-CM

## 2023-12-26 DIAGNOSIS — B009 Herpesviral infection, unspecified: Secondary | ICD-10-CM

## 2023-12-26 NOTE — Progress Notes (Signed)
 43 y.o. G0P0000 Married White or Caucasian female here for annual exam.  Goes to [facility]. Ferritin elevated. Uses testosterone  topically for hx low libido. No vaginal spotting or bleeding.  Lives with her supportive spouse. They have a 1 dog (Pit) and he is having ACL issues. 1 cat.    Patient's last menstrual period was 07/31/2017.          Sexually active: Yes.    The current method of family planning is status post hysterectomy.    Exercising: Yes.     Smoker:  no   Health Maintenance: Pap:  not indicated History of abnormal Pap:  no MMG:  pt reports done 09/2022 Colonoscopy:  guidelines reviewed Screening Labs: reviewed lab work she brought with her.     reports that she quit smoking about 13 years ago. Her smoking use included cigarettes. She has never used smokeless tobacco. She reports current alcohol use of about 2.0 standard drinks of alcohol per week. She reports that she does not use drugs.  Past Medical History:  Diagnosis Date   Abnormal Pap smear of cervix    2008 or 2009   Anxiety    Dental bridge present    lower front   Dry eyes    Endometriosis    Headache    Migraines   History of indigestion    treat with OTC   Hypoglycemia    Irritable bowel syndrome (IBS)    Seasonal allergies    Trigger thumb of right hand 11/2013    Past Surgical History:  Procedure Laterality Date   CHOLECYSTECTOMY  06/23/2015   CHROMOPERTUBATION N/A 05/30/2016   Procedure: CHROMOPERTUBATION;  Surgeon: Lillian Rein, MD;  Location: [facility];  Service: Gynecology;  Laterality: N/A;   COLPOSCOPY  08/22/2006   CYSTOSCOPY N/A 08/21/2017   Procedure: CYSTOSCOPY;  Surgeon: Lillian Rein, MD;  Location: [facility];  Service: Gynecology;  Laterality: N/A;   LABIOPLASTY N/A 08/21/2017   Procedure: LABIAPLASTY;  Surgeon: Lillian Rein, MD;  Location: [facility];  Service: Gynecology;  Laterality: N/A;   LAPAROSCOPY N/A 05/30/2016   Procedure: LAPAROSCOPY OPERATIVE WITH LYSIS  OF ADHESIONS, EXCISION OF POSSIBLE ENDOMETRIOSIS;  Surgeon: Lillian Rein, MD;  Location: [facility];  Service: Gynecology;  Laterality: N/A;   REFRACTIVE SURGERY Bilateral 02/20/2015   TONSILLECTOMY  08/22/1986   TOTAL LAPAROSCOPIC HYSTERECTOMY WITH SALPINGECTOMY Bilateral 08/21/2017   Procedure: TOTAL LAPAROSCOPIC HYSTERECTOMY WITH SALPINGECTOMY;  Surgeon: Lillian Rein, MD;  Location: [facility];  Service: Gynecology;  Laterality: Bilateral;   TRIGGER FINGER RELEASE Right 12/23/2013   Procedure: RIGHT THUMB TRIGGER RELEASE ;  Surgeon: Milagros Alf, MD;  Location: [facility];  Service: Orthopedics;  Laterality: Right;   TRIGGER FINGER RELEASE Bilateral    index fingers    Current Outpatient Medications  Medication Sig Dispense Refill   Carboxymethylcellulose Sod PF 0.5 % SOLN Place 1 drop into both eyes 2 (two) times daily.     Cholecalciferol (VITAMIN D3) 2000 units TABS Take 2,000 Units by mouth at bedtime.     clobetasol  ointment (TEMOVATE ) 0.05 % Apply 1 application. topically 2 (two) times daily. Apply as directed twice daily.  Stop after two weeks. 60 g 0   eletriptan  (RELPAX ) 40 MG tablet TAKE 1 TABLET 1 TIME AS NEEDED FOR HEADACHES 12 tablet 5   ibuprofen  (ADVIL ) 800 MG tablet Take 1 tablet (800 mg total) by mouth every 8 (eight) hours as needed. 30 tablet 1  KRILL OIL PO Take by mouth.     methocarbamol  (ROBAXIN ) 500 MG tablet Take 1 tablet (500 mg total) by mouth every 6 (six) hours as needed for muscle spasms. 30 tablet 5   mometasone  (ELOCON ) 0.1 % ointment Apply topically twice weekly 45 g 3   montelukast  (SINGULAIR ) 10 MG tablet TAKE 1 TABLET AT BEDTIME 90 tablet 2   NALTREXONE HCL, PAIN, PO Take by mouth.     NONFORMULARY OR COMPOUNDED ITEM Testosterone  1mg /0.32ml topical cream.  Apply 0.1ml three times weekly to thighs. Disp: 90 day supply. 1 each 1   ondansetron  (ZOFRAN  ODT) 4 MG disintegrating tablet Take 1 tablet (4 mg total) by mouth every 8 (eight) hours as  needed for nausea or vomiting. 20 tablet 5   Simethicone  125 MG CAPS Take 125 mg by mouth at bedtime.     valACYclovir  (VALTREX ) 1000 MG tablet Take 1 tablet (1,000 mg total) by mouth daily. 90 tablet 4   No current facility-administered medications for this visit.    Family History  Problem Relation Age of Onset   Breast cancer Maternal Grandmother 28       died early 68's   Hyperlipidemia Mother    Heart disease Father        valve replacement, aortic aneurysm   Cancer Paternal Grandfather        Jaw cancer, smoker    ROS: Constitutional: negative, vasomotor symptoms Genitourinary:negative  Exam:   BP 113/79 (BP Location: Left Arm, Patient Position: Sitting)   Pulse 73   Ht 5' 1.25" (1.556 m)   Wt 115 lb (52.2 kg)   LMP 07/31/2017   BMI 21.55 kg/m   Height: 5' 1.25" (155.6 cm)  General appearance: alert, cooperative and appears stated age Head: Normocephalic, without obvious abnormality, atraumatic Lungs: clear to auscultation bilaterally Breasts: normal appearance, no masses or tenderness, Inspection negative, No nipple retraction or dimpling, No nipple discharge or bleeding, No axillary or supraclavicular adenopathy, Normal to palpation without dominant masses Heart: regular rate and rhythm Abdomen: soft, non-tender; bowel sounds normal; no masses,  no organomegaly Extremities: extremities normal, atraumatic, no cyanosis or edema Skin: Skin color, texture, turgor normal. No rashes or lesions Lymph nodes: Cervical, supraclavicular, and axillary nodes normal. No abnormal inguinal nodes palpated Neurologic: Grossly normal   Pelvic: External genitalia:  no lesions              Urethra:  normal appearing urethra with no masses, tenderness or lesions              Bartholins and Skenes: normal                 Vagina: normal appearing vagina with normal color and no discharge, no lesions              Cervix: absent              Pap taken: No. Bimanual Exam:  Uterus:   uterus absent              Adnexa: no mass, fullness, tenderness               Rectovaginal: Confirms               Anus:  normal sphincter tone, no lesions  Chaperone,  CMA, was present for exam.  Assessment/Plan:  1. Encounter for annual routine gynecological examination - Continue annual screening mammograms - Adequate dietary calcium encouraged  2. Lichen sclerosus (Primary) - mometasone  (ELOCON )  0.1 % ointment; Apply topically twice weekly  Dispense: 45 g; Refill: 3  3. HSV-2 (herpes simplex virus 2) infection - valACYclovir  (VALTREX ) 1000 MG tablet; Take 1 tablet (1,000 mg total) by mouth daily.  Dispense: 90 tablet; Refill: 4  4. Decreased libido - NONFORMULARY OR COMPOUNDED ITEM; Testosterone  1mg /0.40ml topical cream.  Apply 0.1ml three times weekly to thighs. Disp: 90 day supply.  Dispense: 1 each; Refill: 4   5. Vasomotor Symptoms - Discussed risks/benefits Estrogen therapy and pt will trial Estradiol  patch  Abe Abed K Aric Jost

## 2023-12-27 MED ORDER — VALACYCLOVIR HCL 1 G PO TABS: 1000.0000 mg | ORAL_TABLET | Freq: Every day | ORAL | 4 refills | Status: AC

## 2023-12-27 MED ORDER — ESTRADIOL 0.05 MG/24HR TD PTTW: 1.0000 | MEDICATED_PATCH | TRANSDERMAL | 12 refills | Status: DC

## 2023-12-27 MED ORDER — MOMETASONE FUROATE 0.1 % EX OINT: TOPICAL_OINTMENT | CUTANEOUS | 3 refills | Status: AC

## 2023-12-27 MED ORDER — NONFORMULARY OR COMPOUNDED ITEM: 4 refills | Status: DC

## 2024-01-16 ENCOUNTER — Other Ambulatory Visit (HOSPITAL_BASED_OUTPATIENT_CLINIC_OR_DEPARTMENT_OTHER): Payer: Self-pay | Admitting: Certified Nurse Midwife

## 2024-02-16 ENCOUNTER — Telehealth: Payer: Self-pay | Admitting: Neurology

## 2024-02-16 ENCOUNTER — Other Ambulatory Visit (HOSPITAL_COMMUNITY): Payer: Self-pay

## 2024-02-16 DIAGNOSIS — G43E09 Chronic migraine with aura, not intractable, without status migrainosus: Secondary | ICD-10-CM

## 2024-02-16 NOTE — Telephone Encounter (Signed)
 Botox  is excluded from pharmacy benefits. Can be sent to [facility] OR [facility] to process under MEDICAL benefits.

## 2024-02-16 NOTE — Telephone Encounter (Signed)
 Pt. Needs call about next botox  appt and insurance/payment. May need to resched

## 2024-02-19 ENCOUNTER — Telehealth: Payer: Self-pay

## 2024-02-19 MED ORDER — ONABOTULINUMTOXINA 200 UNITS IJ SOLR: INTRAMUSCULAR | 4 refills | Status: DC

## 2024-02-19 NOTE — Telephone Encounter (Signed)
 Patient advised via mychart . Script [facility] to [facility].

## 2024-02-19 NOTE — Telephone Encounter (Signed)
 ERROR

## 2024-02-20 LAB — CBC AND DIFFERENTIAL
HCT: 40 (ref 36–46)
Hemoglobin: 13.4 (ref 12.0–16.0)
Neutrophils Absolute: 60
Platelets: 200 K/uL (ref 150–400)
WBC: 6.9

## 2024-02-20 LAB — IRON,TIBC AND FERRITIN PANEL
%SAT: 26
Ferritin: 232
Iron: 316
TIBC: 316
UIBC: 233

## 2024-02-20 LAB — BASIC METABOLIC PANEL WITH GFR
BUN: 15 (ref 4–21)
Creatinine: 0.6 (ref 0.5–1.1)
Glucose: 91

## 2024-02-20 LAB — HEPATIC FUNCTION PANEL
ALT: 12 U/L (ref 7–35)
AST: 13 (ref 13–35)
Alkaline Phosphatase: 67 (ref 25–125)
Bilirubin, Total: 0.3

## 2024-02-20 LAB — CBC: RBC: 4.29 (ref 3.87–5.11)

## 2024-02-20 LAB — COMPREHENSIVE METABOLIC PANEL WITH GFR
Albumin: 4.7 (ref 3.5–5.0)
Calcium: 9.4 (ref 8.7–10.7)
Globulin: 2.1
eGFR: 112

## 2024-02-22 LAB — LAB REPORT - SCANNED: EGFR: 112

## 2024-02-27 ENCOUNTER — Other Ambulatory Visit (HOSPITAL_COMMUNITY): Payer: Self-pay

## 2024-03-01 ENCOUNTER — Ambulatory Visit: Payer: PRIVATE HEALTH INSURANCE | Admitting: Neurology

## 2024-03-05 ENCOUNTER — Telehealth: Payer: Self-pay

## 2024-03-05 NOTE — Telephone Encounter (Signed)
 Sending paperwrk over from [place] about BOtox 

## 2024-03-06 NOTE — Telephone Encounter (Signed)
 The document that was received was for pharmacy benefits, however this medication is excluded from her pharmacy benefits.

## 2024-03-12 ENCOUNTER — Other Ambulatory Visit (HOSPITAL_COMMUNITY): Payer: Self-pay

## 2024-03-13 NOTE — Telephone Encounter (Signed)
 Spoke to [employer] rep: Laurier Lot is out of network with the patient plan since she works for [employer]

## 2024-03-14 ENCOUNTER — Other Ambulatory Visit (HOSPITAL_COMMUNITY): Payer: Self-pay

## 2024-03-14 ENCOUNTER — Telehealth: Payer: Self-pay | Admitting: Pharmacy Technician

## 2024-03-14 MED ORDER — ONABOTULINUMTOXINA 200 UNITS IJ SOLR: INTRAMUSCULAR | 4 refills | Status: DC

## 2024-03-14 NOTE — Telephone Encounter (Signed)
 Script sent

## 2024-03-14 NOTE — Addendum Note (Signed)
 Addended by: OZELL JESUSA PARAS on: 03/14/2024 03:47 PM   Modules accepted: Orders

## 2024-03-14 NOTE — Telephone Encounter (Signed)
 If [facility] is unable to fill based on patient working at [employer] is merged with [facility]. Botox  One report says [facility] [phone]. Please send there.

## 2024-03-15 NOTE — Telephone Encounter (Signed)
 Spoke to [facility] patient will have to be Buy/Bill they can not accept Medical benefit.    Patient advised appt reschedule to 04/12/24.

## 2024-03-15 NOTE — Telephone Encounter (Signed)
 DONE

## 2024-03-29 ENCOUNTER — Ambulatory Visit: Payer: PRIVATE HEALTH INSURANCE | Admitting: Neurology

## 2024-04-03 ENCOUNTER — Other Ambulatory Visit: Payer: Self-pay | Admitting: Family

## 2024-04-12 ENCOUNTER — Ambulatory Visit: Payer: PRIVATE HEALTH INSURANCE | Admitting: Neurology

## 2024-04-15 ENCOUNTER — Telehealth: Payer: Self-pay | Admitting: Neurology

## 2024-04-15 MED ORDER — RIZATRIPTAN BENZOATE 10 MG PO TBDP: 10.0000 mg | ORAL_TABLET | ORAL | 11 refills | Status: DC | PRN

## 2024-04-15 NOTE — Telephone Encounter (Signed)
 Spoke to the pharmacy, explained Dr./Jaffe note, She already tried sumatriptan and didn't tolerate it. Would rizatriptan  10mg  be an option?   Per rep please go ahead and send the script and we check it to see if it will allow for patient to fill.

## 2024-04-15 NOTE — Progress Notes (Signed)
 NEUROLOGY FOLLOW UP OFFICE NOTE  TISHANNA DUNFORD [id number]  Assessment/Plan:   1  Chronic migraine without aura, without status migrainosus, not intractable 2  Migraine with aura 3  Left sided occipital neuralgia/cervicogenic headache/cervicalgia - recent paroxsymal head pain may have been cervicogenic  When her insurance changes with the new year, she will need to change providers.  Until then, will try to get her migraines under control since Botox  is no longer an option. Migraine prevention:  Plan to start Aimovig  140mg  every 28 days Migraine rescue: rizatriptan  10mg .  Zofran  for nausea  Methocarbamol  for neck and occipital pain. Keep headache diary Due to increased headache when coughing, will check CTA head Follow up in late November-December  Total time spent on today's visit was 31 minutes dedicated to this patient today, preparing to see patient, examining the patient, ordering tests and/or medications and counseling the patient, documenting clinical information in the EHR or other health record, discussing treatment and goals, answering patient's questions and coordinating care.       Subjective:  VIVECA BECKSTROM is a 43 year old right-handed female who follows up for migraines.  UPDATE: Underwent one treatment of Botox  in April. However, because I am out of network, she was unable to continue it although we were told she would be able to buy and bill.  Because her insurance is going to change in January, she needs to change providers to an [facility] provider.  She did notice some improvement after just one round.  The next day after the Botox , she had an episode of dizziness, lightheaded.  She had to lower herself down to the floor to prevent herself from passing out. It was brief.  Not associated with a migraine but she did have a migraine the previous day.    Headaches are therefore unchanged. Intensity:  3-4/10 Duration:  1 hour with rizatriptan  but one time headache  returned later (did not re-dose).  She was unable to use eletriptan  due to interaction with itraconazole for mold exposure. Frequency:  16 days a month   Last week she was walking and had a severe paroxysmal shooting pain in the left temple radiating back for 15 seconds.  She never had this before and not since.  Sometimes when she coughs, she notes pain in the back of her head as well.    Current NSAIDS/analgesics:  Excedrin Migraine, ibuprofen  800mg  Current triptans:  rizatriptan  10mg  Current ergotamine:  none Current anti-emetic:  Zofran -ODT 4mg  Current muscle relaxants:  Robaxin  500mg  Q6h PRN Current Antihypertensive medications:  none Current Antidepressant medications:  none Current Anticonvulsant medications:  none Current anti-CGRP:  none Current Vitamins/Herbal/Supplements:  magnesium, melatonin, fish oil , D3 Current Antihistamines/Decongestants:  none Other therapy:  Daith piercing Hormone/birth control:  none  Caffeine:  1 cup coffee in AM, no more soda Diet:  Gluten-free.  No soda.  Drinks water .   Exercise:  Routine Depression:  no; Anxiety:  mild Other pain:  She had been experiencing joint pain in the hands.  Evaluated by rheumatology.  Work up suggested non-inflammatory cause, such as due to overuse.   Sleep hygiene:  improved.  Stopped Ambien .  Now on melatonin  HISTORY:  Onset:  First migraine around age 60.  Just aura without headache.  A couple years later, started more regularly with headache Location:  left sided/temple, 2021 - some left sided neck pain and left-occipital - sore to touch  Quality:  pressure Initial intensity:  Severe.  She denies new  headache, thunderclap headache Aura:  fuzzy mass in center of vision lasting 45 minutes.  Not with every migraine. Prodrome:  no Associated symptoms:  Nausea, photophobia, osmophobia. Sometimes neck pain.  Sensitive to touch on back of head. denies associated unilateral numbness or weakness. Initial Duration:  2  hours with Relpax .  May last up to 5-6 days. Initial Frequency:  10 to 12 days a month Initial Frequency of abortive medication: 10-12 days Relpax  Triggers:  Change in weather, hormonal, air fresheners Relieving factors:  sometimes an ice pack, resting, sometimes apply pressure to temple Activity:  aggravates  She once had what seemed like an ocular migraine where she stood up and saw a flash followed by seeing geometric shapes lasted 45 minutes to an hour.  She had a headache afterwards.  Soon afterwards she was diagnosed with a torn retina.    Past NSAIDS/analgesics:  naproxen, tramadol  Past abortive triptans:  sumatriptan tab (couldn't function on it), eletriptan  40mg  (effective but stopped due to being on  Past abortive ergotamine:  none Past muscle relaxants:  Flexeril  at night for neck pain (makes her not feel well the following day. Past anti-emetic:  none Past antihypertensive medications:  propranolol   Past antidepressant medications:  nortriptyline  (daytime fatigue), trazodone  Past anticonvulsant medications:  topiramate  (side effects) Past anti-CGRP:  Qulipta  60mg  (effective but caused constipation), [place] Past vitamins/Herbal/Supplements:  riboflavin, CoQ10 Past antihistamines/decongestants:  Flonase Other treatment:  acupuncture, physical therapy      Family history of headache:  no Patient works as a TEFL teacher and would like a medication with minimal side effects.  Triptans make her feel fatigue which can affect her work.    PAST MEDICAL HISTORY: Past Medical History:  Diagnosis Date   Abnormal Pap smear of cervix    2008 or 2009   Anxiety    Dental bridge present    lower front   Dry eyes    Endometriosis    Headache    Migraines   History of indigestion    treat with OTC   Hypoglycemia    Irritable bowel syndrome (IBS)    Seasonal allergies    Trigger thumb of right hand 11/2013    MEDICATIONS: Current Outpatient Medications on File Prior to Visit   Medication Sig Dispense Refill   botulinum toxin Type A  (BOTOX ) 200 units injection Inject 155 units IM into multiple site in the face,neck and head once every 90 days 1 each 4   Carboxymethylcellulose Sod PF 0.5 % SOLN Place 1 drop into both eyes 2 (two) times daily.     Cholecalciferol (VITAMIN D3) 2000 units TABS Take 2,000 Units by mouth at bedtime.     clobetasol  ointment (TEMOVATE ) 0.05 % Apply 1 application. topically 2 (two) times daily. Apply as directed twice daily.  Stop after two weeks. 60 g 0   eletriptan  (RELPAX ) 40 MG tablet TAKE 1 TABLET 1 TIME AS NEEDED FOR HEADACHES 12 tablet 5   estradiol  (VIVELLE -DOT) 0.05 MG/24HR patch PLACE 1 PATCH (0.05 MG TOTAL) ONTO THE SKIN 2 (TWO) TIMES A WEEK. 24 patch 0   ibuprofen  (ADVIL ) 800 MG tablet Take 1 tablet (800 mg total) by mouth every 8 (eight) hours as needed. 30 tablet 1   KRILL OIL PO Take by mouth.     methocarbamol  (ROBAXIN ) 500 MG tablet Take 1 tablet (500 mg total) by mouth every 6 (six) hours as needed for muscle spasms. 30 tablet 5   mometasone  (ELOCON ) 0.1 % ointment Apply topically twice  weekly 45 g 3   montelukast  (SINGULAIR ) 10 MG tablet Take 1 tablet (10 mg total) by mouth at bedtime. 90 tablet 1   NALTREXONE HCL, PAIN, PO Take by mouth.     NONFORMULARY OR COMPOUNDED ITEM Testosterone  1mg /0.10ml topical cream.  Apply 0.1ml three times weekly to thighs. Disp: 90 day supply. 1 each 4   ondansetron  (ZOFRAN  ODT) 4 MG disintegrating tablet Take 1 tablet (4 mg total) by mouth every 8 (eight) hours as needed for nausea or vomiting. 20 tablet 5   Simethicone  125 MG CAPS Take 125 mg by mouth at bedtime.     valACYclovir  (VALTREX ) 1000 MG tablet Take 1 tablet (1,000 mg total) by mouth daily. 90 tablet 4   No current facility-administered medications on file prior to visit.    ALLERGIES: Allergies  Allergen Reactions   Other     Dermabond.  Topical blisters.    FAMILY HISTORY: Family History  Problem Relation Age of Onset    Breast cancer Maternal Grandmother 28       died early 46's   Hyperlipidemia Mother    Heart disease Father        valve replacement, aortic aneurysm   Cancer Paternal Grandfather        Jaw cancer, smoker      Objective:  Blood pressure 131/78, pulse 97, height 5' 1 (1.549 m), weight 113 lb 9.6 oz (51.5 kg), last menstrual period 07/31/2017, SpO2 98%. General: No acute distress.  Patient appears well-groomed.   Head:  Normocephalic/atraumatic Eyes:  Fundi examined but not visualized Neck: supple, left paraspinal tenderness, full range of motion Heart:  Regular rate and rhythm Neurological Exam: alert and oriented.  Speech fluent and not dysarthric, language intact.  CN II-XII intact. Bulk and tone normal, muscle strength 5/5 throughout.  Sensation to light touch intact.  Deep tendon reflexes 2+ throughout, toes downgoing.  Finger to nose testing intact.  Gait normal, Romberg negative.   Juliene Dunnings, DO  CC: Eleanor Ponto, NP

## 2024-04-15 NOTE — Telephone Encounter (Signed)
 Lonell  from the Pharmacy wants to know if pt can get  Sumatriptan instead of Eletriptan  due to drug , drug Interaction. Thanks

## 2024-04-24 ENCOUNTER — Encounter: Payer: Self-pay | Admitting: Neurology

## 2024-04-24 ENCOUNTER — Ambulatory Visit: Payer: PRIVATE HEALTH INSURANCE | Admitting: Neurology

## 2024-04-24 ENCOUNTER — Encounter: Payer: Self-pay | Admitting: Family

## 2024-04-24 VITALS — BP 131/78 | HR 97 | Ht 61.0 in | Wt 113.6 lb

## 2024-04-24 DIAGNOSIS — G4483 Primary cough headache: Secondary | ICD-10-CM

## 2024-04-24 DIAGNOSIS — G43E09 Chronic migraine with aura, not intractable, without status migrainosus: Secondary | ICD-10-CM

## 2024-04-24 MED ORDER — AIMOVIG 140 MG/ML ~~LOC~~ SOAJ: 140.0000 mg | SUBCUTANEOUS | 5 refills | Status: DC

## 2024-04-24 NOTE — Patient Instructions (Addendum)
 Plan to start Aimovig  140mg  Verdi every 28 days. If no improvement in 3 months, contact me Rizatriptan  as needed for migraine attack Zofran  for nausea Check CTA head  Follow up November or December

## 2024-04-24 NOTE — Progress Notes (Signed)
 Called [employer] and spoke to [place] S and initiated PA for CTA head w/ w/o CPT=70496. Was informed to send in clinicals to fax#:[phone]. Pending case#:[id number]. Simone B.

## 2024-04-24 NOTE — Progress Notes (Signed)
 Clinicals faxed. Pending determination.

## 2024-04-26 ENCOUNTER — Encounter: Payer: Self-pay | Admitting: Neurology

## 2024-04-26 ENCOUNTER — Encounter: Payer: BC Managed Care – PPO | Admitting: Family

## 2024-04-30 ENCOUNTER — Telehealth: Payer: Self-pay | Admitting: Pharmacy Technician

## 2024-04-30 ENCOUNTER — Other Ambulatory Visit (HOSPITAL_COMMUNITY): Payer: Self-pay

## 2024-04-30 NOTE — Telephone Encounter (Signed)
 Pharmacy Patient Advocate Encounter   Received notification from Fax that prior authorization for AIMOVIG  140MG  is required/requested.   Insurance verification completed.   The patient is insured through [employer] .   Per test claim: PA required; PA submitted to above mentioned insurance via Latent Key/confirmation #/EOC [id number] Status is pending

## 2024-04-30 NOTE — Telephone Encounter (Signed)
 Pharmacy Patient Advocate Encounter  Received notification from [facility] that Prior Authorization for AIMOVIG  140MG  has been APPROVED from 9.9.25 to 3.8.26   PA #/Case ID/Reference #: [id number]  Must fill at [facility]. Please call [phone]

## 2024-04-30 NOTE — Telephone Encounter (Signed)
 Also faxed PA request and chart notes to [phone].

## 2024-05-03 ENCOUNTER — Encounter: Payer: Self-pay | Admitting: Family

## 2024-05-03 ENCOUNTER — Ambulatory Visit: Payer: Self-pay | Admitting: Family

## 2024-05-03 ENCOUNTER — Ambulatory Visit: Payer: PRIVATE HEALTH INSURANCE | Admitting: Family

## 2024-05-03 VITALS — BP 109/79 | HR 74 | Temp 98.0°F | Resp 16 | Ht 61.0 in | Wt 102.2 lb

## 2024-05-03 DIAGNOSIS — E039 Hypothyroidism, unspecified: Secondary | ICD-10-CM

## 2024-05-03 DIAGNOSIS — K580 Irritable bowel syndrome with diarrhea: Secondary | ICD-10-CM | POA: Diagnosis not present

## 2024-05-03 DIAGNOSIS — Z0000 Encounter for general adult medical examination without abnormal findings: Secondary | ICD-10-CM | POA: Diagnosis not present

## 2024-05-03 DIAGNOSIS — R5383 Other fatigue: Secondary | ICD-10-CM

## 2024-05-03 LAB — LIPID PANEL
Cholesterol: 202 mg/dL — ABNORMAL HIGH (ref 0–200)
HDL: 89.2 mg/dL (ref >39.00)
LDL Cholesterol: 104 mg/dL — ABNORMAL HIGH (ref 0–99)
NonHDL: 112.8
Total CHOL/HDL Ratio: 2
Triglycerides: 45 mg/dL (ref 0.0–149.0)
VLDL: 9 mg/dL (ref 0.0–40.0)

## 2024-05-03 LAB — TSH: TSH: 1.04 u[IU]/mL (ref 0.35–5.50)

## 2024-05-03 NOTE — Progress Notes (Addendum)
 Subjective:     Patient ID: [name], female    DOB: [birth year], 43 y.o.   MRN: [id number]  Chief Complaint  Patient presents with   Annual Exam    HPI  Discussed the use of AI scribe software for clinical note transcription with the patient, who gave verbal consent to proceed.  History of Present Illness  [name] is a 43 year old female who presents with fatigue and gastrointestinal issues. She experiences significant fatigue since late last year, with episodes of extreme tiredness, including falling asleep in her car. Fatigue persists despite treatment with itraconazole for mold exposure, and she also experiences brain fog. She has gastrointestinal issues, including a recent tapeworm diagnosis and positive Giardia test per her integrative medicine doctor. Treatment with metronidazole, sporanox, prazaquantel and herbal remedies led to significant improvement in bowel movements, now normal after years of IBS symptoms. She experiences chronic joint pain in her hands, typically untreated with Tylenol . Migraines occur two to three times weekly, with Botox  previously attempted and Aimovig  pending. She underwent a hysterectomy and uses an estrogen patch. Her ferritin levels are slightly elevated, and cholesterol is around 225. She practices yoga regularly but fatigue limits other physical activities. She has completely discontinued alcohol intake.  Immunizations: will get flu shot at work Diet: healthy Exercise: yoga Colonoscopy: begin at age 68 Pap Smear: hysterectomy Mammogram: up to date Vision: having floaters, vitreous detachment right eye- no retinal detachment- following closely with eye doctor, cataract Dental: up to date      Health Maintenance Due  Topic Date Due   COVID-19 Vaccine (5 - 2025-26 season) 04/22/2024    Past Medical History:  Diagnosis Date   Abnormal Pap smear of cervix    2008 or 2009   Anxiety    Dental bridge present    lower front    Dry eyes    Endometriosis    Headache    Migraines   History of indigestion    treat with OTC   Hypoglycemia    Irritable bowel syndrome (IBS)    Seasonal allergies    Trigger thumb of right hand 11/2013    Past Surgical History:  Procedure Laterality Date   CHOLECYSTECTOMY  06/23/2015   CHROMOPERTUBATION N/A 05/30/2016   Procedure: CHROMOPERTUBATION;  Surgeon: Ronal GORMAN Pinal, MD;  Location: [facility];  Service: Gynecology;  Laterality: N/A;   COLPOSCOPY  08/22/2006   CYSTOSCOPY N/A 08/21/2017   Procedure: CYSTOSCOPY;  Surgeon: Pinal Ronal GORMAN, MD;  Location: [facility];  Service: Gynecology;  Laterality: N/A;   LABIOPLASTY N/A 08/21/2017   Procedure: LABIAPLASTY;  Surgeon: Pinal Ronal GORMAN, MD;  Location: [facility];  Service: Gynecology;  Laterality: N/A;   LAPAROSCOPY N/A 05/30/2016   Procedure: LAPAROSCOPY OPERATIVE WITH LYSIS OF ADHESIONS, EXCISION OF POSSIBLE ENDOMETRIOSIS;  Surgeon: Ronal GORMAN Pinal, MD;  Location: [facility];  Service: Gynecology;  Laterality: N/A;   REFRACTIVE SURGERY Bilateral 02/20/2015   TONSILLECTOMY  08/22/1986   TOTAL LAPAROSCOPIC HYSTERECTOMY WITH SALPINGECTOMY Bilateral 08/21/2017   Procedure: TOTAL LAPAROSCOPIC HYSTERECTOMY WITH SALPINGECTOMY;  Surgeon: Pinal Ronal GORMAN, MD;  Location: [facility];  Service: Gynecology;  Laterality: Bilateral;   TRIGGER FINGER RELEASE Right 12/23/2013   Procedure: RIGHT THUMB TRIGGER RELEASE ;  Surgeon: Franky JONELLE Curia, MD;  Location: [facility];  Service: Orthopedics;  Laterality: Right;   TRIGGER FINGER RELEASE Bilateral    index fingers    Family History  Problem Relation Age of Onset  Breast cancer Maternal Grandmother 28       died early 13's   Hyperlipidemia Mother    Heart disease Father        valve replacement, aortic aneurysm   Cancer Paternal Grandfather        Jaw cancer, smoker    Social History   Socioeconomic History   Marital status: Married    Spouse name: Not on file   Number of children: Not on  file   Years of education: Not on file   Highest education level: Not on file  Occupational History   Occupation: Surgical tech  Tobacco Use   Smoking status: Former    Current packs/day: 0.00    Types: Cigarettes    Quit date: 08/22/2010    Years since quitting: 13.7   Smokeless tobacco: Never  Vaping Use   Vaping status: Never Used  Substance and Sexual Activity   Alcohol use: Not Currently   Drug use: No   Sexual activity: Yes    Partners: Male    Birth control/protection: Surgical    Comment: TLH  Other Topics Concern   Not on file  Social History Narrative      Married   No children   [employer]    Enjoys hiking with dog, exercise for stress relief   Two story home   Caffeine 1-1/2 day   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   Social Drivers of Health   Financial Resource Strain: Patient Declined (04/30/2024)   Overall Financial Resource Strain (CARDIA)    Difficulty of Paying Living Expenses: Patient declined  Food Insecurity: Patient Declined (04/30/2024)   Hunger Vital Sign    Worried About Running Out of Food in the Last Year: Patient declined    Ran Out of Food in the Last Year: Patient declined  Transportation Needs: Patient Declined (04/30/2024)   PRAPARE - Administrator, Civil Service (Medical): Patient declined    Lack of Transportation (Non-Medical): Patient declined  Physical Activity: Unknown (04/30/2024)   Exercise Vital Sign    Days of Exercise per Week: Patient declined    Minutes of Exercise per Session: Not on file  Stress: Patient Declined (04/30/2024)   [employer] of Occupational Health - Occupational Stress Questionnaire    Feeling of Stress: Patient declined  Social Connections: Unknown (04/30/2024)   Social Connection and Isolation Panel    Frequency of Communication with Friends and Family: Patient declined    Frequency of Social Gatherings with Friends and Family: Patient declined    Attends Religious  Services: Patient declined    Database administrator or Organizations: Patient declined    Attends Banker Meetings: Not on file    Marital Status: Married  Intimate Partner Violence: Not At Risk (05/31/2019)   Humiliation, Afraid, Rape, and Kick questionnaire    Fear of Current or Ex-Partner: No    Emotionally Abused: No    Physically Abused: No    Sexually Abused: No    Outpatient Medications Prior to Visit  Medication Sig Dispense Refill   Carboxymethylcellulose Sod PF 0.5 % SOLN Place 1 drop into both eyes 2 (two) times daily.     Cholecalciferol (VITAMIN D3) 2000 units TABS Take 2,000 Units by mouth at bedtime.     clobetasol  ointment (TEMOVATE ) 0.05 % Apply 1 application. topically 2 (two) times daily. Apply as directed twice daily.  Stop after two weeks. 60 g 0  Erenumab -aooe (AIMOVIG ) 140 MG/ML SOAJ Inject 140 mg into the skin every 28 (twenty-eight) days. 1.12 mL 5   estradiol  (VIVELLE -DOT) 0.05 MG/24HR patch PLACE 1 PATCH (0.05 MG TOTAL) ONTO THE SKIN 2 (TWO) TIMES A WEEK. 24 patch 0   itraconazole (SPORANOX) 100 MG capsule Take 100 mg by mouth 2 (two) times daily.     KRILL OIL PO Take by mouth.     MAGNESIUM GLYCINATE PO      methocarbamol  (ROBAXIN ) 500 MG tablet Take 1 tablet (500 mg total) by mouth every 6 (six) hours as needed for muscle spasms. 30 tablet 5   mometasone  (ELOCON ) 0.1 % ointment Apply topically twice weekly 45 g 3   montelukast  (SINGULAIR ) 10 MG tablet Take 1 tablet (10 mg total) by mouth at bedtime. 90 tablet 1   NALTREXONE HCL, PAIN, PO Take by mouth.     ondansetron  (ZOFRAN  ODT) 4 MG disintegrating tablet Take 1 tablet (4 mg total) by mouth every 8 (eight) hours as needed for nausea or vomiting. 20 tablet 5   progesterone (PROMETRIUM) 100 MG capsule Take 200 mg by mouth.     rizatriptan  (MAXALT -MLT) 10 MG disintegrating tablet Take 1 tablet (10 mg total) by mouth as needed for migraine. May repeat in 2 hours if needed 9 tablet 11    Simethicone  125 MG CAPS Take 125 mg by mouth at bedtime.     valACYclovir  (VALTREX ) 1000 MG tablet Take 1 tablet (1,000 mg total) by mouth daily. 90 tablet 4   botulinum toxin Type A  (BOTOX ) 200 units injection Inject 155 units IM into multiple site in the face,neck and head once every 90 days 1 each 4   ibuprofen  (ADVIL ) 800 MG tablet Take 1 tablet (800 mg total) by mouth every 8 (eight) hours as needed. (Patient not taking: Reported on 04/24/2024) 30 tablet 1   NONFORMULARY OR COMPOUNDED ITEM Testosterone  1mg /0.61ml topical cream.  Apply 0.1ml three times weekly to thighs. Disp: 90 day supply. 1 each 4   No facility-administered medications prior to visit.    Allergies  Allergen Reactions   Other     Dermabond.  Topical blisters.    Review of Systems  Constitutional:  Negative for weight loss.  HENT:  Negative for congestion and hearing loss.   Eyes:  Negative for blurred vision.  Respiratory:  Negative for cough.   Cardiovascular:  Negative for leg swelling.  Gastrointestinal:  Negative for constipation and diarrhea.  Genitourinary:  Positive for urgency (mild- chronic). Negative for dysuria and frequency.  Musculoskeletal:  Positive for joint pain (bilateral hands). Negative for myalgias.  Skin:  Negative for rash.  Neurological:  Positive for headaches (migraines 2-3 times a week).  Psychiatric/Behavioral:         Denies depression/anxiety       Objective:    Physical Exam   BP 109/79 (BP Location: Right Arm, Patient Position: Sitting, Cuff Size: Small)   Pulse 74   Temp 98 F (36.7 C) (Oral)   Resp 16   Ht 5' 1 (1.549 m)   Wt 102 lb 3.2 oz (46.4 kg)   LMP 07/31/2017   SpO2 100%   BMI 19.31 kg/m  Wt Readings from Last 3 Encounters:  05/03/24 102 lb 3.2 oz (46.4 kg)  04/24/24 113 lb 9.6 oz (51.5 kg)  12/26/23 115 lb (52.2 kg)  Physical Exam  Constitutional: She is oriented to person, place, and time. She appears well-developed and well-nourished. No distress.   HENT:  Head:  Normocephalic and atraumatic.  Right Ear: Tympanic membrane and ear canal normal.  Left Ear: Tympanic membrane and ear canal normal.  Mouth/Throat: Oropharynx is clear and moist.  Eyes: Pupils are equal, round, and reactive to light. No scleral icterus.  Neck: Normal range of motion. No thyromegaly present.  Cardiovascular: Normal rate and regular rhythm.   No murmur heard. Pulmonary/Chest: Effort normal and breath sounds normal. No respiratory distress. He has no wheezes. She has no rales. She exhibits no tenderness.  Abdominal: Soft. Bowel sounds are normal. She exhibits no distension and no mass. There is no tenderness. There is no rebound and no guarding.  Musculoskeletal: She exhibits no edema.  Lymphadenopathy:    She has no cervical adenopathy.  Neurological: She is alert and oriented to person, place, and time. She has normal patellar reflexes. She exhibits normal muscle tone. Coordination normal.  Skin: Skin is warm and dry.  Psychiatric: She has a normal mood and affect. Her behavior is normal. Judgment and thought content normal.  Breast/Pelvic: deferred        Assessment & Plan:        Assessment & Plan:   Problem List Items Addressed This Visit       Unprioritized   Preventative health care - Primary   Continue healthy diet and regular exercise. Pap up to date, plan to begin colo at 45.  Plans to get flu shot at work. Declines hep b and gardisil vaccines.       IBS (irritable bowel syndrome)   Symptoms have resolved.  Monitor.       Other Visit Diagnoses       Fatigue, unspecified type       Relevant Orders   TSH     Hypothyroidism, unspecified type       Relevant Orders   Lipid panel       I have discontinued [name]'s ibuprofen , NONFORMULARY OR COMPOUNDED ITEM, and botulinum toxin Type A . I am also having her maintain her Simethicone , Vitamin D3, Carboxymethylcellulose Sod PF, clobetasol  ointment, KRILL OIL PO,  methocarbamol , ondansetron , (NALTREXONE HCL, PAIN, PO), mometasone , valACYclovir , estradiol , montelukast , rizatriptan , MAGNESIUM GLYCINATE PO, progesterone, itraconazole, and Aimovig .  No orders of the defined types were placed in this encounter.

## 2024-05-03 NOTE — Assessment & Plan Note (Signed)
Symptoms have resolved  Monitor

## 2024-05-03 NOTE — Patient Instructions (Addendum)
 VISIT SUMMARY:  Today, we addressed your ongoing fatigue, gastrointestinal issues, migraines, elevated cholesterol, and joint pain. We also discussed your eye health and allergic rhinitis management.  YOUR PLAN:  FATIGUE AND BRAIN FOG:  -We will check your thyroid  function with a TSH test.  MIGRAINE WITH AURA (OCULAR MIGRAINE): You have chronic migraines with aura occurring 2-3 times per week. -Start using Aimovig  for migraine prevention once it arrives.  ELEVATED CHOLESTEROL: Your cholesterol levels are elevated at 225 mg/dL. -We will order a cholesterol test since you have fasted. -Continue focusing on a healthy diet and lifestyle modifications.  ELEVATED FERRITIN: Your ferritin levels are slightly elevated. -No specific action needed at this time.  VITREOUS DETACHMENT, RIGHT EYE WITH FLOATERS AND EARLY CATARACT: You have a vitreous detachment in your right eye with floaters and early cataract formation. -Monitor for any changes in vision, especially flashes or increased floaters.  JOINT PAIN IN HANDS: You have chronic joint pain in your hands. -Consider using Tylenol  for relief as needed.  ALLERGIC RHINITIS: Your allergic rhinitis is being managed with montelukast . -Continue taking montelukast  as it is providing benefit.

## 2024-05-03 NOTE — Assessment & Plan Note (Signed)
 Continue healthy diet and regular exercise. Pap up to date, plan to begin colo at 45.  Plans to get flu shot at work. Declines hep b and gardisil vaccines.

## 2024-05-27 ENCOUNTER — Other Ambulatory Visit (HOSPITAL_BASED_OUTPATIENT_CLINIC_OR_DEPARTMENT_OTHER): Payer: Self-pay

## 2024-05-27 ENCOUNTER — Encounter (HOSPITAL_BASED_OUTPATIENT_CLINIC_OR_DEPARTMENT_OTHER): Payer: Self-pay | Admitting: Obstetrics & Gynecology

## 2024-05-27 DIAGNOSIS — Z9189 Other specified personal risk factors, not elsewhere classified: Secondary | ICD-10-CM

## 2024-05-27 DIAGNOSIS — Z01419 Encounter for gynecological examination (general) (routine) without abnormal findings: Secondary | ICD-10-CM

## 2024-05-27 NOTE — Addendum Note (Signed)
 Addended by: VAN MORNA SAILOR on: 05/27/2024 11:58 AM   Modules accepted: Orders

## 2024-06-11 ENCOUNTER — Encounter: Payer: Self-pay | Admitting: Neurology

## 2024-06-11 ENCOUNTER — Telehealth: Payer: Self-pay | Admitting: Neurology

## 2024-06-11 NOTE — Telephone Encounter (Signed)
  error

## 2024-07-03 ENCOUNTER — Encounter (HOSPITAL_BASED_OUTPATIENT_CLINIC_OR_DEPARTMENT_OTHER): Payer: Self-pay | Admitting: Obstetrics & Gynecology

## 2024-07-03 ENCOUNTER — Other Ambulatory Visit (HOSPITAL_BASED_OUTPATIENT_CLINIC_OR_DEPARTMENT_OTHER): Payer: Self-pay

## 2024-07-03 DIAGNOSIS — Z9189 Other specified personal risk factors, not elsewhere classified: Secondary | ICD-10-CM

## 2024-07-23 ENCOUNTER — Telehealth (HOSPITAL_BASED_OUTPATIENT_CLINIC_OR_DEPARTMENT_OTHER): Payer: Self-pay

## 2024-07-23 NOTE — Telephone Encounter (Signed)
 Spoke with Mliss Dustman, Breast Navigator, with [facility]. Informed her that we are waiting to receive a response for the PA that was started on 07/15/2024. Advised that we would call the insurance company today to receive an update on authorization. Patient is scheduled for biopsies on 08/09/2024. Mliss call back number is [phone].  Morna LOISE Quale, RN

## 2024-07-23 NOTE — Progress Notes (Unsigned)
 NEUROLOGY FOLLOW UP OFFICE NOTE  [name] [id number]  Assessment/Plan:   1  Migraine without aura, without status migrainosus, not intractable 2  Migraine with aura 3  Left sided occipital neuralgia/cervicogenic headache/cervicalgia  Migraine prevention:  Aimovig  140mg  every 28 days Migraine rescue: eletriptan  40mg  (discontinue rizatripan);  Zofran  for nausea  Methocarbamol  for neck and occipital pain. Keep headache diary Provided refills until she can establish care with a neurologist under her insurance plan at [facility].       Subjective:  [name] is a 43 year old right-handed female who follows up for migraines.  UPDATE: Started Aimovig  in September. Doing well. Intensity:  3-4/10 Frequency:  3 days a  month.  She does not think that rizatriptan  is as effective as eletriptan .  She is no longer taking itraconazole, so she would like to return to eletriptan .  In September, she was walking and had a severe paroxysmal shooting pain in the left temple radiating back for 15 seconds.  She never had this before and not since.  Sometimes when she coughs, she notes pain in the back of her head as well.  CTA head on 05/27/2024 was normal.  Current NSAIDS/analgesics:  Excedrin Migraine, ibuprofen  800mg  Current triptans:  rizatriptan  10mg  Current ergotamine:  none Current anti-emetic:  Zofran -ODT 4mg  Current muscle relaxants:  Robaxin  500mg  Q6h PRN Current Antihypertensive medications:  none Current Antidepressant medications:  none Current Anticonvulsant medications:  none Current anti-CGRP:  Aimovig  140mg  Current Vitamins/Herbal/Supplements:  magnesium, melatonin, fish oil , D3 Current Antihistamines/Decongestants:  none Other therapy:  Daith piercing Hormone/birth control:  none  Caffeine:  1 cup coffee in AM, no more soda Diet:  Gluten-free.  No soda.  Drinks water .   Exercise:  Routine Depression:  no; Anxiety:  mild Other pain:  She had been experiencing  joint pain in the hands.  Evaluated by rheumatology.  Work up suggested non-inflammatory cause, such as due to overuse.   Sleep hygiene:  improved on HRT and melatonin  HISTORY:  Onset:  First migraine around age 61.  Just aura without headache.  A couple years later, started more regularly with headache Location:  left sided/temple, 2021 - some left sided neck pain and left-occipital - sore to touch  Quality:  pressure Initial intensity:  Severe.  She denies new headache, thunderclap headache Aura:  fuzzy mass in center of vision lasting 45 minutes.  Not with every migraine. Prodrome:  no Associated symptoms:  Nausea, photophobia, osmophobia. Sometimes neck pain.  Sensitive to touch on back of head. denies associated unilateral numbness or weakness. Initial Duration:  2 hours with Relpax .  May last up to 5-6 days. Initial Frequency:  10 to 12 days a month Initial Frequency of abortive medication: 10-12 days Relpax  Triggers:  Change in weather, hormonal, air fresheners Relieving factors:  sometimes an ice pack, resting, sometimes apply pressure to temple Activity:  aggravates  She once had what seemed like an ocular migraine where she stood up and saw a flash followed by seeing geometric shapes lasted 45 minutes to an hour.  She had a headache afterwards.  Soon afterwards she was diagnosed with a torn retina.    Past NSAIDS/analgesics:  naproxen, tramadol  Past abortive triptans:  sumatriptan tab (couldn't function on it), eletriptan  40mg  (effective but stopped due to interaction with itraconazole for mold exposure). Past abortive ergotamine:  none Past muscle relaxants:  Flexeril  at night for neck pain (makes her not feel well the following day. Past anti-emetic:  none Past antihypertensive medications:  propranolol   Past antidepressant medications:  nortriptyline  (daytime fatigue), trazodone  Past anticonvulsant medications:  topiramate  (side effects) Past anti-CGRP:  Qulipta  60mg   (effective but caused constipation), [place] Past vitamins/Herbal/Supplements:  riboflavin, CoQ10 Past antihistamines/decongestants:  Flonase Other treatment:  Botox  (one treatment but noted improvement); acupuncture, physical therapy      Family history of headache:  no Patient works as a [employer] teacher and would like a medication with minimal side effects.  Triptans make her feel fatigue which can affect her work.    PAST MEDICAL HISTORY: Past Medical History:  Diagnosis Date   Abnormal Pap smear of cervix    2008 or 2009   Anxiety    Dental bridge present    lower front   Dry eyes    Endometriosis    Headache    Migraines   History of indigestion    treat with OTC   Hypoglycemia    Irritable bowel syndrome (IBS)    Seasonal allergies    Trigger thumb of right hand 11/2013    MEDICATIONS: Current Outpatient Medications on File Prior to Visit  Medication Sig Dispense Refill   Carboxymethylcellulose Sod PF 0.5 % SOLN Place 1 drop into both eyes 2 (two) times daily.     Cholecalciferol (VITAMIN D3) 2000 units TABS Take 2,000 Units by mouth at bedtime.     clobetasol  ointment (TEMOVATE ) 0.05 % Apply 1 application. topically 2 (two) times daily. Apply as directed twice daily.  Stop after two weeks. 60 g 0   Erenumab -aooe (AIMOVIG ) 140 MG/ML SOAJ Inject 140 mg into the skin every 28 (twenty-eight) days. 1.12 mL 5   estradiol  (VIVELLE -DOT) 0.05 MG/24HR patch PLACE 1 PATCH (0.05 MG TOTAL) ONTO THE SKIN 2 (TWO) TIMES A WEEK. 24 patch 0   itraconazole (SPORANOX) 100 MG capsule Take 100 mg by mouth 2 (two) times daily.     KRILL OIL PO Take by mouth.     MAGNESIUM GLYCINATE PO      methocarbamol  (ROBAXIN ) 500 MG tablet Take 1 tablet (500 mg total) by mouth every 6 (six) hours as needed for muscle spasms. 30 tablet 5   mometasone  (ELOCON ) 0.1 % ointment Apply topically twice weekly 45 g 3   montelukast  (SINGULAIR ) 10 MG tablet Take 1 tablet (10 mg total) by mouth at bedtime. 90 tablet  1   NALTREXONE HCL, PAIN, PO Take by mouth.     ondansetron  (ZOFRAN  ODT) 4 MG disintegrating tablet Take 1 tablet (4 mg total) by mouth every 8 (eight) hours as needed for nausea or vomiting. 20 tablet 5   progesterone (PROMETRIUM) 100 MG capsule Take 200 mg by mouth.     rizatriptan  (MAXALT -MLT) 10 MG disintegrating tablet Take 1 tablet (10 mg total) by mouth as needed for migraine. May repeat in 2 hours if needed 9 tablet 11   Simethicone  125 MG CAPS Take 125 mg by mouth at bedtime.     valACYclovir  (VALTREX ) 1000 MG tablet Take 1 tablet (1,000 mg total) by mouth daily. 90 tablet 4   No current facility-administered medications on file prior to visit.    ALLERGIES: Allergies  Allergen Reactions   Other     Dermabond.  Topical blisters.    FAMILY HISTORY: Family History  Problem Relation Age of Onset   Breast cancer Maternal Grandmother 28       died early 56's   Hyperlipidemia Mother    Heart disease Father  valve replacement, aortic aneurysm   Cancer Paternal Grandfather        Jaw cancer, smoker      Objective:  Blood pressure 107/73, pulse 87, height 5' (1.524 m), weight 114 lb 3.2 oz (51.8 kg), last menstrual period 07/31/2017, SpO2 98%. General: No acute distress.  Patient appears well-groomed.   Head:  Normocephalic/atraumatic Neck:  Supple.  No paraspinal tenderness.  Full range of motion. Heart:  Regular rate and rhythm. Neuro:  Alert and oriented.  Speech fluent and not dysarthric.  Language intact.  CN II-XII intact.  Bulk and tone normal.  Muscle strength 5/5 throughout.  Sensation to light touch intact.  Deep tendon reflexes 2+ throughout, toes downgoing.  Gait normal.  Romberg negative.    Juliene Dunnings, DO  CC: Eleanor Ponto, NP

## 2024-07-24 ENCOUNTER — Ambulatory Visit: Payer: PRIVATE HEALTH INSURANCE | Admitting: Neurology

## 2024-07-24 ENCOUNTER — Encounter: Payer: Self-pay | Admitting: Neurology

## 2024-07-24 VITALS — BP 107/73 | HR 87 | Ht 60.0 in | Wt 114.2 lb

## 2024-07-24 DIAGNOSIS — G43109 Migraine with aura, not intractable, without status migrainosus: Secondary | ICD-10-CM | POA: Diagnosis not present

## 2024-07-24 MED ORDER — AIMOVIG 140 MG/ML ~~LOC~~ SOAJ: 140.0000 mg | SUBCUTANEOUS | 5 refills | Status: AC

## 2024-07-24 MED ORDER — ONDANSETRON 4 MG PO TBDP: 4.0000 mg | ORAL_TABLET | Freq: Three times a day (TID) | ORAL | 5 refills | Status: AC | PRN

## 2024-07-24 MED ORDER — METHOCARBAMOL 500 MG PO TABS: 500.0000 mg | ORAL_TABLET | Freq: Four times a day (QID) | ORAL | 5 refills | Status: AC | PRN

## 2024-07-24 MED ORDER — ELETRIPTAN HYDROBROMIDE 40 MG PO TABS: ORAL_TABLET | ORAL | 5 refills | Status: AC

## 2024-09-02 ENCOUNTER — Encounter (HOSPITAL_BASED_OUTPATIENT_CLINIC_OR_DEPARTMENT_OTHER): Payer: Self-pay | Admitting: Obstetrics & Gynecology

## 2024-09-02 ENCOUNTER — Other Ambulatory Visit (HOSPITAL_BASED_OUTPATIENT_CLINIC_OR_DEPARTMENT_OTHER): Payer: Self-pay

## 2024-09-02 DIAGNOSIS — R6882 Decreased libido: Secondary | ICD-10-CM

## 2024-09-02 MED ORDER — NONFORMULARY OR COMPOUNDED ITEM: 1 refills | Status: AC

## 2024-09-02 NOTE — Telephone Encounter (Signed)
 Rx has been faxed over to [facility] tbw

## 2024-09-02 NOTE — Telephone Encounter (Signed)
 Request sent to [facility] tbw

## 2024-12-30 ENCOUNTER — Ambulatory Visit (HOSPITAL_BASED_OUTPATIENT_CLINIC_OR_DEPARTMENT_OTHER): Payer: PRIVATE HEALTH INSURANCE | Admitting: Obstetrics & Gynecology
